# Patient Record
Sex: Male | Born: 1945 | Race: White | Hispanic: No | Marital: Single | Smoking: Never smoker
Health system: Southern US, Community
[De-identification: ages and names within clinical notes are randomized; demographics above are authoritative.]

## PROBLEM LIST (undated history)

## (undated) DIAGNOSIS — E079 Disorder of thyroid, unspecified: Secondary | ICD-10-CM

## (undated) DIAGNOSIS — I10 Essential (primary) hypertension: Secondary | ICD-10-CM

## (undated) DIAGNOSIS — E119 Type 2 diabetes mellitus without complications: Secondary | ICD-10-CM

## (undated) HISTORY — DX: Type 2 diabetes mellitus without complications: E11.9

## (undated) HISTORY — PX: COLONOSCOPY WITH PROPOFOL: SHX5780

## (undated) HISTORY — DX: Essential (primary) hypertension: I10

## (undated) HISTORY — DX: Disorder of thyroid, unspecified: E07.9

## (undated) HISTORY — PX: HERNIA REPAIR: SHX51

---

## 2012-06-19 DIAGNOSIS — Z23 Encounter for immunization: Secondary | ICD-10-CM | POA: Diagnosis not present

## 2013-02-21 DIAGNOSIS — R209 Unspecified disturbances of skin sensation: Secondary | ICD-10-CM | POA: Diagnosis not present

## 2013-02-21 DIAGNOSIS — N529 Male erectile dysfunction, unspecified: Secondary | ICD-10-CM | POA: Diagnosis not present

## 2013-02-21 DIAGNOSIS — N181 Chronic kidney disease, stage 1: Secondary | ICD-10-CM | POA: Diagnosis not present

## 2013-02-21 DIAGNOSIS — G5790 Unspecified mononeuropathy of unspecified lower limb: Secondary | ICD-10-CM | POA: Diagnosis not present

## 2013-02-21 DIAGNOSIS — I129 Hypertensive chronic kidney disease with stage 1 through stage 4 chronic kidney disease, or unspecified chronic kidney disease: Secondary | ICD-10-CM | POA: Diagnosis not present

## 2013-03-01 ENCOUNTER — Ambulatory Visit: Payer: Self-pay | Admitting: Internal Medicine

## 2013-03-01 DIAGNOSIS — G5790 Unspecified mononeuropathy of unspecified lower limb: Secondary | ICD-10-CM | POA: Diagnosis not present

## 2013-03-01 DIAGNOSIS — M5137 Other intervertebral disc degeneration, lumbosacral region: Secondary | ICD-10-CM | POA: Diagnosis not present

## 2013-03-01 DIAGNOSIS — N529 Male erectile dysfunction, unspecified: Secondary | ICD-10-CM | POA: Diagnosis not present

## 2013-03-01 DIAGNOSIS — IMO0002 Reserved for concepts with insufficient information to code with codable children: Secondary | ICD-10-CM | POA: Diagnosis not present

## 2013-03-01 DIAGNOSIS — M79609 Pain in unspecified limb: Secondary | ICD-10-CM | POA: Diagnosis not present

## 2013-03-15 DIAGNOSIS — R209 Unspecified disturbances of skin sensation: Secondary | ICD-10-CM | POA: Diagnosis not present

## 2013-03-15 DIAGNOSIS — I129 Hypertensive chronic kidney disease with stage 1 through stage 4 chronic kidney disease, or unspecified chronic kidney disease: Secondary | ICD-10-CM | POA: Diagnosis not present

## 2013-03-15 DIAGNOSIS — E785 Hyperlipidemia, unspecified: Secondary | ICD-10-CM | POA: Diagnosis not present

## 2013-03-15 DIAGNOSIS — N181 Chronic kidney disease, stage 1: Secondary | ICD-10-CM | POA: Diagnosis not present

## 2013-03-24 DIAGNOSIS — R209 Unspecified disturbances of skin sensation: Secondary | ICD-10-CM | POA: Diagnosis not present

## 2013-03-24 DIAGNOSIS — G5790 Unspecified mononeuropathy of unspecified lower limb: Secondary | ICD-10-CM | POA: Diagnosis not present

## 2013-03-24 DIAGNOSIS — E785 Hyperlipidemia, unspecified: Secondary | ICD-10-CM | POA: Diagnosis not present

## 2013-03-24 DIAGNOSIS — N529 Male erectile dysfunction, unspecified: Secondary | ICD-10-CM | POA: Diagnosis not present

## 2013-06-20 DIAGNOSIS — Z23 Encounter for immunization: Secondary | ICD-10-CM | POA: Diagnosis not present

## 2013-07-04 DIAGNOSIS — Z125 Encounter for screening for malignant neoplasm of prostate: Secondary | ICD-10-CM | POA: Diagnosis not present

## 2013-07-04 DIAGNOSIS — N529 Male erectile dysfunction, unspecified: Secondary | ICD-10-CM | POA: Diagnosis not present

## 2013-07-04 DIAGNOSIS — Z1331 Encounter for screening for depression: Secondary | ICD-10-CM | POA: Diagnosis not present

## 2013-07-04 DIAGNOSIS — E785 Hyperlipidemia, unspecified: Secondary | ICD-10-CM | POA: Diagnosis not present

## 2013-07-04 DIAGNOSIS — Z800 Family history of malignant neoplasm of digestive organs: Secondary | ICD-10-CM | POA: Diagnosis not present

## 2013-08-22 ENCOUNTER — Ambulatory Visit: Payer: Self-pay | Admitting: Gastroenterology

## 2013-08-22 DIAGNOSIS — Z1211 Encounter for screening for malignant neoplasm of colon: Secondary | ICD-10-CM | POA: Diagnosis not present

## 2013-08-22 DIAGNOSIS — K5730 Diverticulosis of large intestine without perforation or abscess without bleeding: Secondary | ICD-10-CM | POA: Diagnosis not present

## 2013-08-22 DIAGNOSIS — Z79899 Other long term (current) drug therapy: Secondary | ICD-10-CM | POA: Diagnosis not present

## 2013-08-22 DIAGNOSIS — D126 Benign neoplasm of colon, unspecified: Secondary | ICD-10-CM | POA: Diagnosis not present

## 2013-08-22 DIAGNOSIS — K648 Other hemorrhoids: Secondary | ICD-10-CM | POA: Diagnosis not present

## 2013-09-26 DIAGNOSIS — I739 Peripheral vascular disease, unspecified: Secondary | ICD-10-CM | POA: Diagnosis not present

## 2013-09-26 DIAGNOSIS — G5790 Unspecified mononeuropathy of unspecified lower limb: Secondary | ICD-10-CM | POA: Diagnosis not present

## 2013-09-26 DIAGNOSIS — R209 Unspecified disturbances of skin sensation: Secondary | ICD-10-CM | POA: Diagnosis not present

## 2013-09-26 DIAGNOSIS — N529 Male erectile dysfunction, unspecified: Secondary | ICD-10-CM | POA: Diagnosis not present

## 2013-10-12 DIAGNOSIS — G5790 Unspecified mononeuropathy of unspecified lower limb: Secondary | ICD-10-CM | POA: Diagnosis not present

## 2013-10-12 DIAGNOSIS — E785 Hyperlipidemia, unspecified: Secondary | ICD-10-CM | POA: Diagnosis not present

## 2013-10-12 DIAGNOSIS — R209 Unspecified disturbances of skin sensation: Secondary | ICD-10-CM | POA: Diagnosis not present

## 2013-10-12 DIAGNOSIS — Z79899 Other long term (current) drug therapy: Secondary | ICD-10-CM | POA: Diagnosis not present

## 2013-10-12 DIAGNOSIS — N529 Male erectile dysfunction, unspecified: Secondary | ICD-10-CM | POA: Diagnosis not present

## 2013-10-12 DIAGNOSIS — M79609 Pain in unspecified limb: Secondary | ICD-10-CM | POA: Diagnosis not present

## 2013-10-17 DIAGNOSIS — I739 Peripheral vascular disease, unspecified: Secondary | ICD-10-CM | POA: Diagnosis not present

## 2013-12-14 IMAGING — CR DG LUMBAR SPINE AP/LAT/OBLIQUES W/ FLEX AND EXT
1 series · 6 of 6 positions shown · non-contrast
Comparison: none

REASON FOR EXAM: PAIN IN LEFT LEG
COMMENTS:

PROCEDURE:     DXR - DXR LUMBAR SPINE WITH OBLIQUES  - March 01, 2013 [DATE]
RESULT:     Comparison: None.

[Series 1: t lumbar spine ap · 0.14mm/px · 6 of 6 slices shown]
[im 1/6]
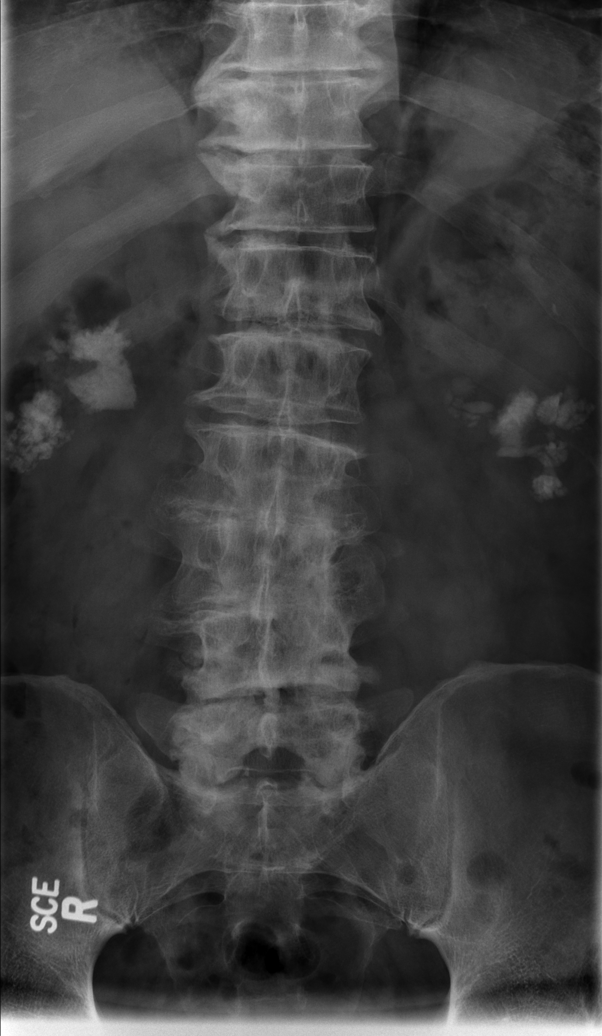
[im 2/6]
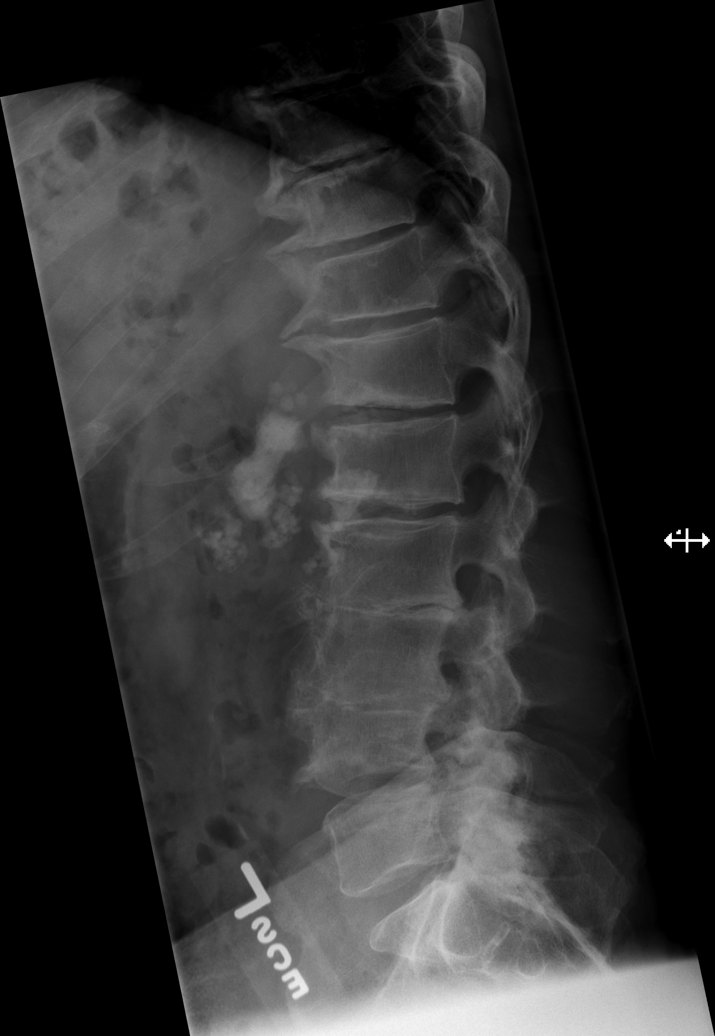
[im 3/6]
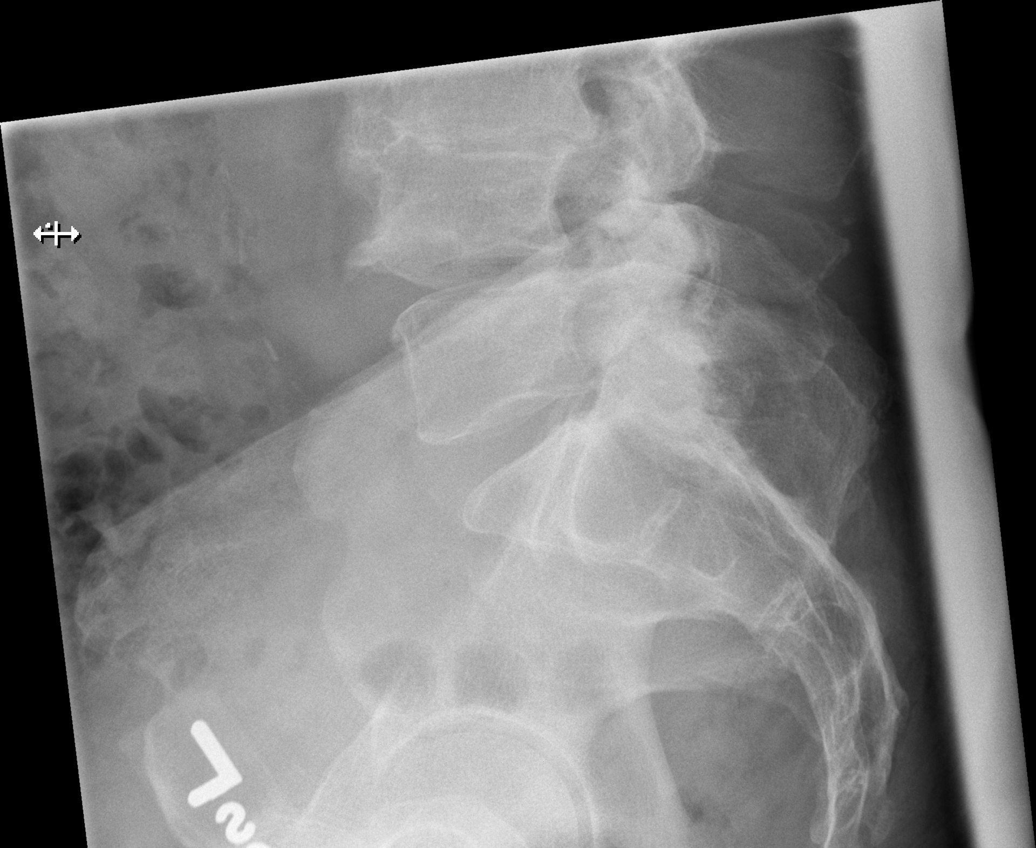
[im 4/6]
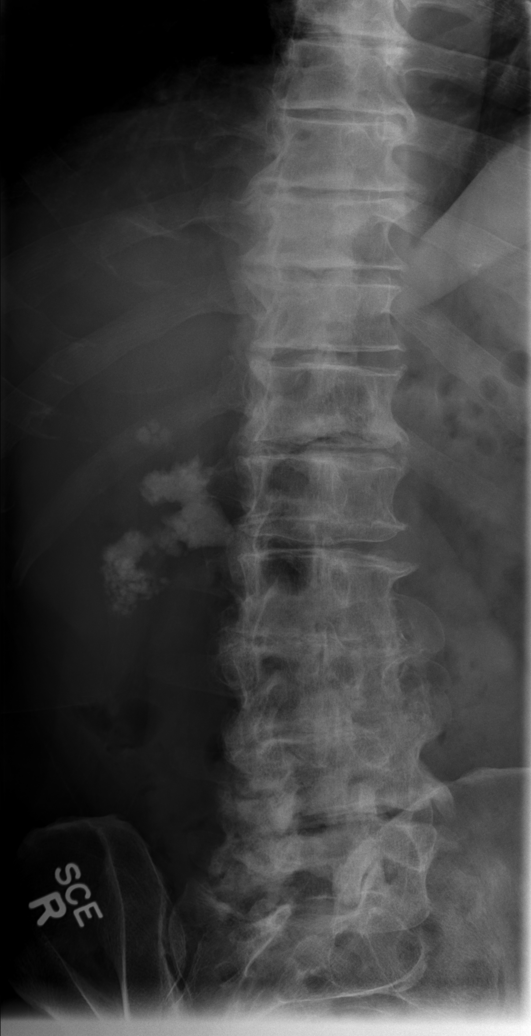
[im 5/6]
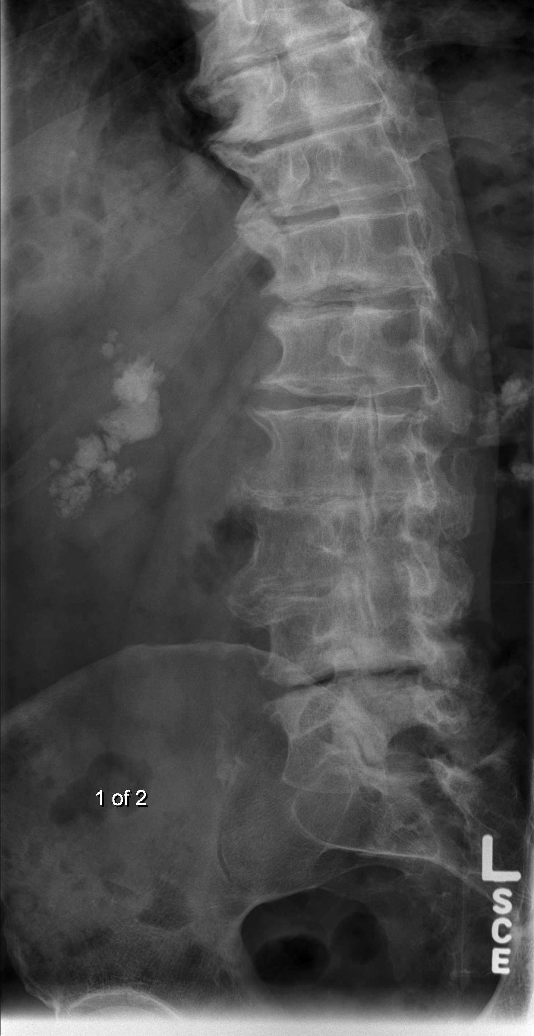
[im 6/6]
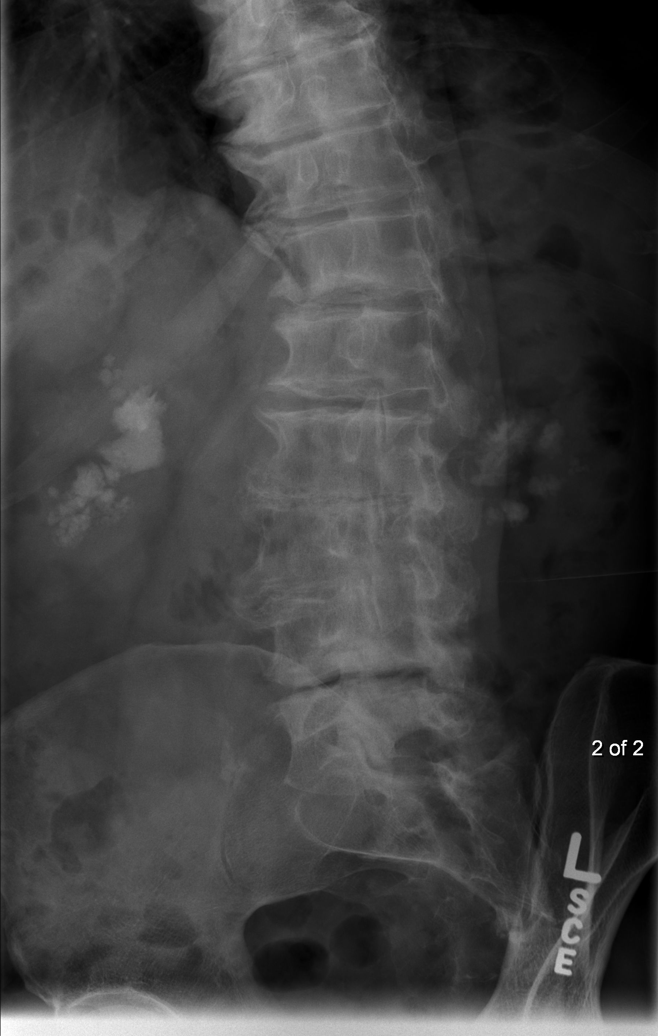

[6 of 6 positions shown; findings below may reference images not displayed]

FINDINGS: There are 5 lumbar type vertebra bodies. There is slight dextrocurvature of
the lumbar spine. Intervertebral disc loss and osteophytosis are seen at
L2-L3 and L3-L4. There is mild age-indeterminate height loss of the T12, L1,
and L3 vertebral bodies. Normal alignment. Flowing anterior ossification in
the lower thoracic spine may represent diffuse idiopathic skeletal
hyperostosis (DISH). No definite pars defect on the oblique views.

Large calculi are seen in bilateral kidneys.
IMPRESSION: 1. Multilevel degenerative disc disease.
2. Mild age-indeterminate height loss of the T12, L1, and L3 vertebral
bodies. Correlate with patient's history and site of pain.
3. Other findings as above.

[REDACTED]

## 2014-01-16 DIAGNOSIS — G5790 Unspecified mononeuropathy of unspecified lower limb: Secondary | ICD-10-CM | POA: Diagnosis not present

## 2014-01-16 DIAGNOSIS — E785 Hyperlipidemia, unspecified: Secondary | ICD-10-CM | POA: Diagnosis not present

## 2014-02-16 DIAGNOSIS — G5790 Unspecified mononeuropathy of unspecified lower limb: Secondary | ICD-10-CM | POA: Diagnosis not present

## 2014-02-16 DIAGNOSIS — E785 Hyperlipidemia, unspecified: Secondary | ICD-10-CM | POA: Diagnosis not present

## 2014-02-16 DIAGNOSIS — R209 Unspecified disturbances of skin sensation: Secondary | ICD-10-CM | POA: Diagnosis not present

## 2014-02-16 DIAGNOSIS — N529 Male erectile dysfunction, unspecified: Secondary | ICD-10-CM | POA: Diagnosis not present

## 2014-05-18 DIAGNOSIS — E785 Hyperlipidemia, unspecified: Secondary | ICD-10-CM | POA: Diagnosis not present

## 2014-05-18 DIAGNOSIS — G5790 Unspecified mononeuropathy of unspecified lower limb: Secondary | ICD-10-CM | POA: Diagnosis not present

## 2014-06-14 DIAGNOSIS — Z23 Encounter for immunization: Secondary | ICD-10-CM | POA: Diagnosis not present

## 2014-08-15 DIAGNOSIS — Z23 Encounter for immunization: Secondary | ICD-10-CM | POA: Diagnosis not present

## 2014-08-17 DIAGNOSIS — N529 Male erectile dysfunction, unspecified: Secondary | ICD-10-CM | POA: Diagnosis not present

## 2014-08-17 DIAGNOSIS — E785 Hyperlipidemia, unspecified: Secondary | ICD-10-CM | POA: Diagnosis not present

## 2014-08-17 DIAGNOSIS — R200 Anesthesia of skin: Secondary | ICD-10-CM | POA: Diagnosis not present

## 2014-08-30 DIAGNOSIS — H2513 Age-related nuclear cataract, bilateral: Secondary | ICD-10-CM | POA: Diagnosis not present

## 2014-11-16 DIAGNOSIS — Z0000 Encounter for general adult medical examination without abnormal findings: Secondary | ICD-10-CM | POA: Diagnosis not present

## 2014-11-16 DIAGNOSIS — E785 Hyperlipidemia, unspecified: Secondary | ICD-10-CM | POA: Diagnosis not present

## 2014-11-16 DIAGNOSIS — N529 Male erectile dysfunction, unspecified: Secondary | ICD-10-CM | POA: Diagnosis not present

## 2014-11-17 DIAGNOSIS — Z125 Encounter for screening for malignant neoplasm of prostate: Secondary | ICD-10-CM | POA: Diagnosis not present

## 2014-11-17 DIAGNOSIS — I10 Essential (primary) hypertension: Secondary | ICD-10-CM | POA: Diagnosis not present

## 2014-11-17 DIAGNOSIS — E784 Other hyperlipidemia: Secondary | ICD-10-CM | POA: Diagnosis not present

## 2014-11-30 DIAGNOSIS — E785 Hyperlipidemia, unspecified: Secondary | ICD-10-CM | POA: Diagnosis not present

## 2014-11-30 DIAGNOSIS — E786 Lipoprotein deficiency: Secondary | ICD-10-CM | POA: Diagnosis not present

## 2014-11-30 DIAGNOSIS — R200 Anesthesia of skin: Secondary | ICD-10-CM | POA: Diagnosis not present

## 2015-03-02 DIAGNOSIS — E785 Hyperlipidemia, unspecified: Secondary | ICD-10-CM | POA: Diagnosis not present

## 2015-03-02 DIAGNOSIS — I10 Essential (primary) hypertension: Secondary | ICD-10-CM | POA: Diagnosis not present

## 2015-03-02 DIAGNOSIS — N529 Male erectile dysfunction, unspecified: Secondary | ICD-10-CM | POA: Diagnosis not present

## 2015-06-01 DIAGNOSIS — I10 Essential (primary) hypertension: Secondary | ICD-10-CM | POA: Diagnosis not present

## 2015-06-04 DIAGNOSIS — Z23 Encounter for immunization: Secondary | ICD-10-CM | POA: Diagnosis not present

## 2015-08-31 DIAGNOSIS — R200 Anesthesia of skin: Secondary | ICD-10-CM | POA: Diagnosis not present

## 2015-08-31 DIAGNOSIS — I10 Essential (primary) hypertension: Secondary | ICD-10-CM | POA: Diagnosis not present

## 2015-08-31 DIAGNOSIS — N529 Male erectile dysfunction, unspecified: Secondary | ICD-10-CM | POA: Diagnosis not present

## 2015-12-04 DIAGNOSIS — N529 Male erectile dysfunction, unspecified: Secondary | ICD-10-CM | POA: Diagnosis not present

## 2015-12-04 DIAGNOSIS — E785 Hyperlipidemia, unspecified: Secondary | ICD-10-CM | POA: Diagnosis not present

## 2016-03-04 DIAGNOSIS — N529 Male erectile dysfunction, unspecified: Secondary | ICD-10-CM | POA: Diagnosis not present

## 2016-03-04 DIAGNOSIS — R200 Anesthesia of skin: Secondary | ICD-10-CM | POA: Diagnosis not present

## 2016-03-04 DIAGNOSIS — E785 Hyperlipidemia, unspecified: Secondary | ICD-10-CM | POA: Diagnosis not present

## 2016-04-03 DIAGNOSIS — E784 Other hyperlipidemia: Secondary | ICD-10-CM | POA: Diagnosis not present

## 2016-04-03 DIAGNOSIS — Z125 Encounter for screening for malignant neoplasm of prostate: Secondary | ICD-10-CM | POA: Diagnosis not present

## 2016-04-03 DIAGNOSIS — I10 Essential (primary) hypertension: Secondary | ICD-10-CM | POA: Diagnosis not present

## 2016-04-10 DIAGNOSIS — Z0000 Encounter for general adult medical examination without abnormal findings: Secondary | ICD-10-CM | POA: Diagnosis not present

## 2016-06-18 DIAGNOSIS — Z23 Encounter for immunization: Secondary | ICD-10-CM | POA: Diagnosis not present

## 2016-10-09 DIAGNOSIS — N529 Male erectile dysfunction, unspecified: Secondary | ICD-10-CM | POA: Diagnosis not present

## 2016-10-09 DIAGNOSIS — E785 Hyperlipidemia, unspecified: Secondary | ICD-10-CM | POA: Diagnosis not present

## 2016-10-09 DIAGNOSIS — R200 Anesthesia of skin: Secondary | ICD-10-CM | POA: Diagnosis not present

## 2016-11-11 DIAGNOSIS — R200 Anesthesia of skin: Secondary | ICD-10-CM | POA: Diagnosis not present

## 2016-11-11 DIAGNOSIS — E785 Hyperlipidemia, unspecified: Secondary | ICD-10-CM | POA: Diagnosis not present

## 2016-11-11 DIAGNOSIS — N529 Male erectile dysfunction, unspecified: Secondary | ICD-10-CM | POA: Diagnosis not present

## 2017-02-02 DIAGNOSIS — Z1389 Encounter for screening for other disorder: Secondary | ICD-10-CM | POA: Diagnosis not present

## 2017-02-02 DIAGNOSIS — N529 Male erectile dysfunction, unspecified: Secondary | ICD-10-CM | POA: Diagnosis not present

## 2017-02-02 DIAGNOSIS — E785 Hyperlipidemia, unspecified: Secondary | ICD-10-CM | POA: Diagnosis not present

## 2017-02-02 DIAGNOSIS — R200 Anesthesia of skin: Secondary | ICD-10-CM | POA: Diagnosis not present

## 2017-04-21 DIAGNOSIS — R200 Anesthesia of skin: Secondary | ICD-10-CM | POA: Diagnosis not present

## 2017-04-21 DIAGNOSIS — E785 Hyperlipidemia, unspecified: Secondary | ICD-10-CM | POA: Diagnosis not present

## 2017-04-21 DIAGNOSIS — N529 Male erectile dysfunction, unspecified: Secondary | ICD-10-CM | POA: Diagnosis not present

## 2017-06-19 DIAGNOSIS — Z23 Encounter for immunization: Secondary | ICD-10-CM | POA: Diagnosis not present

## 2017-07-22 DIAGNOSIS — R200 Anesthesia of skin: Secondary | ICD-10-CM | POA: Diagnosis not present

## 2017-07-22 DIAGNOSIS — N529 Male erectile dysfunction, unspecified: Secondary | ICD-10-CM | POA: Diagnosis not present

## 2017-07-22 DIAGNOSIS — E785 Hyperlipidemia, unspecified: Secondary | ICD-10-CM | POA: Diagnosis not present

## 2017-10-21 DIAGNOSIS — R200 Anesthesia of skin: Secondary | ICD-10-CM | POA: Diagnosis not present

## 2017-10-21 DIAGNOSIS — N529 Male erectile dysfunction, unspecified: Secondary | ICD-10-CM | POA: Diagnosis not present

## 2017-10-21 DIAGNOSIS — E785 Hyperlipidemia, unspecified: Secondary | ICD-10-CM | POA: Diagnosis not present

## 2018-01-20 DIAGNOSIS — R200 Anesthesia of skin: Secondary | ICD-10-CM | POA: Diagnosis not present

## 2018-01-20 DIAGNOSIS — E785 Hyperlipidemia, unspecified: Secondary | ICD-10-CM | POA: Diagnosis not present

## 2018-01-20 DIAGNOSIS — N529 Male erectile dysfunction, unspecified: Secondary | ICD-10-CM | POA: Diagnosis not present

## 2018-01-26 DIAGNOSIS — E7849 Other hyperlipidemia: Secondary | ICD-10-CM | POA: Diagnosis not present

## 2018-01-26 DIAGNOSIS — I10 Essential (primary) hypertension: Secondary | ICD-10-CM | POA: Diagnosis not present

## 2018-01-26 DIAGNOSIS — Z125 Encounter for screening for malignant neoplasm of prostate: Secondary | ICD-10-CM | POA: Diagnosis not present

## 2018-02-09 DIAGNOSIS — Z0000 Encounter for general adult medical examination without abnormal findings: Secondary | ICD-10-CM | POA: Diagnosis not present

## 2018-02-09 DIAGNOSIS — R200 Anesthesia of skin: Secondary | ICD-10-CM | POA: Diagnosis not present

## 2018-02-09 DIAGNOSIS — E785 Hyperlipidemia, unspecified: Secondary | ICD-10-CM | POA: Diagnosis not present

## 2018-02-09 DIAGNOSIS — E039 Hypothyroidism, unspecified: Secondary | ICD-10-CM | POA: Diagnosis not present

## 2018-03-29 DIAGNOSIS — H6123 Impacted cerumen, bilateral: Secondary | ICD-10-CM | POA: Diagnosis not present

## 2018-03-29 DIAGNOSIS — H9190 Unspecified hearing loss, unspecified ear: Secondary | ICD-10-CM | POA: Diagnosis not present

## 2018-03-30 DIAGNOSIS — H9190 Unspecified hearing loss, unspecified ear: Secondary | ICD-10-CM | POA: Diagnosis not present

## 2018-06-23 DIAGNOSIS — Z23 Encounter for immunization: Secondary | ICD-10-CM | POA: Diagnosis not present

## 2018-06-29 ENCOUNTER — Encounter: Payer: Self-pay | Admitting: Podiatry

## 2018-06-29 ENCOUNTER — Other Ambulatory Visit: Payer: Self-pay | Admitting: Podiatry

## 2018-06-29 ENCOUNTER — Ambulatory Visit (INDEPENDENT_AMBULATORY_CARE_PROVIDER_SITE_OTHER): Payer: Medicare Other | Admitting: Podiatry

## 2018-06-29 ENCOUNTER — Ambulatory Visit (INDEPENDENT_AMBULATORY_CARE_PROVIDER_SITE_OTHER): Payer: Medicare Other

## 2018-06-29 VITALS — BP 169/85 | HR 74

## 2018-06-29 DIAGNOSIS — M7662 Achilles tendinitis, left leg: Secondary | ICD-10-CM | POA: Diagnosis not present

## 2018-06-29 DIAGNOSIS — M25673 Stiffness of unspecified ankle, not elsewhere classified: Secondary | ICD-10-CM | POA: Diagnosis not present

## 2018-06-29 DIAGNOSIS — M25571 Pain in right ankle and joints of right foot: Secondary | ICD-10-CM

## 2018-06-29 DIAGNOSIS — M7661 Achilles tendinitis, right leg: Secondary | ICD-10-CM | POA: Diagnosis not present

## 2018-06-29 DIAGNOSIS — M25572 Pain in left ankle and joints of left foot: Principal | ICD-10-CM

## 2018-06-30 DIAGNOSIS — H9190 Unspecified hearing loss, unspecified ear: Secondary | ICD-10-CM | POA: Diagnosis not present

## 2018-06-30 DIAGNOSIS — H6123 Impacted cerumen, bilateral: Secondary | ICD-10-CM | POA: Diagnosis not present

## 2018-07-02 NOTE — Progress Notes (Signed)
   HPI: 72 year old male presenting today as a new patient with a chief complaint of difficulty balancing or flex his feet. He reports associated stiffness of the ankles. He reports the symptoms are intermittent and began 4-5 years ago. He has not done anything for treatment. Walking increases the pain. Patient is here for further evaluation and treatment.   History reviewed. No pertinent past medical history.   Physical Exam: General: The patient is alert and oriented x3 in no acute distress.  Dermatology: Skin is warm, dry and supple bilateral lower extremities. Negative for open lesions or macerations.  Vascular: Palpable pedal pulses bilaterally. No edema or erythema noted. Capillary refill within normal limits.  Neurological: Epicritic and protective threshold grossly intact bilaterally.   Musculoskeletal Exam: Range of motion within normal limits to all pedal and ankle joints bilateral. Muscle strength 5/5 in all groups bilateral.   Radiographic Exam:  Normal osseous mineralization. Joint spaces preserved. No fracture/dislocation/boney destruction.    Assessment: 1. Generalized foot and ankle stiffness bilateral    Plan of Care:  1. Patient evaluated. X-Rays reviewed.  2. Orders for physical therapy three times per week for four weeks placed today.  3. Return to clinic in 6 weeks.      Edrick Kins, DPM [facility]  Dr. Edrick Kins, DPM    [address], [place] [address]                Office [phone]  Fax [phone]

## 2018-07-21 ENCOUNTER — Ambulatory Visit: Payer: Medicare Other | Admitting: Physical Therapy

## 2018-07-27 ENCOUNTER — Other Ambulatory Visit: Payer: Self-pay

## 2018-07-27 ENCOUNTER — Ambulatory Visit: Payer: Medicare Other | Attending: Podiatry | Admitting: Physical Therapy

## 2018-07-27 ENCOUNTER — Encounter: Payer: Self-pay | Admitting: Physical Therapy

## 2018-07-27 DIAGNOSIS — R262 Difficulty in walking, not elsewhere classified: Secondary | ICD-10-CM | POA: Diagnosis not present

## 2018-07-27 DIAGNOSIS — R202 Paresthesia of skin: Secondary | ICD-10-CM | POA: Insufficient documentation

## 2018-07-27 DIAGNOSIS — M25571 Pain in right ankle and joints of right foot: Secondary | ICD-10-CM | POA: Insufficient documentation

## 2018-07-27 DIAGNOSIS — M25572 Pain in left ankle and joints of left foot: Secondary | ICD-10-CM | POA: Diagnosis not present

## 2018-07-27 NOTE — Therapy (Signed)
[place] [facility] [address] [place], [address] Phone: [phone]   Fax:  [phone]  Physical Therapy Evaluation  Patient Details  Name: Adam Castillo MRN: [id number] Date of Birth: [birth year] Referring Provider (PT): Edrick Kins, [place]   Encounter Date: 07/27/2018  PT End of Session - 07/27/18 1611    Visit Number  1    Number of Visits  12    Date for PT Re-Evaluation  09/07/18    Authorization Type  Medicare    Authorization Time Period  Current Cert period: 29/93/7169 - 09/07/2018    Authorization - Visit Number  1    Authorization - Number of Visits  10    PT Start Time  6789    PT Stop Time  1540    PT Time Calculation (min)  65 min    Activity Tolerance  Patient tolerated treatment well    Behavior During Therapy  [facility] for tasks assessed/performed       History reviewed. No pertinent past medical history.  History reviewed. No pertinent surgical history.  There were no vitals filed for this visit.  SUBJECTIVE: HISTORY:  Patient is a 72 y.o. male who presents to outpatient physical therapy with a referral for generalized foot and ankle stiffness. This patient's chief complaints consist of a "stiff" feeling and numbness and tingling in bilateral feet,, leading to the following functional deficits: difficulty with prolonged weight bearing and walking tasks. Relevant past medical history and comorbidities include hypothyroidism, hypertension. 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 Pt states he had bloodwork done in the last 6 months and was informed it was very good Imaging:  in office radiographs bilateral ankles 06/29/18 reported by provider "Normal osseous mineralization. Joint spaces preserved. No fracture/dislocation/boney destruction." Response to previously administered skilled services: none  Patient states condition started maybe 3-4 years ago with gradual onset and no apparent  injury. Pt reports his condition seems to stay about the same. His feet get sore at night after working a long time and he wears boots at work. Both feet feel about the same, neither is worse than the other. Feet feel achy at night after working.  Denies falls in last 6 months.  Not as active but not just because of feet.  Does not make him feel like not leaving house.   SPINAL HISTORY: a few years ago it was said his back looked normal for his age.   FUNCTION Patient-reported Outcome Measure: Pt did not fully complete.  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 Involves a lot of standing, walking, lifting, ladder climbing sometimes. Semi-retired (doesn't work every day, could work all week or not at all. 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 Wears boots (no steel toes, no extra padding) Hobbies: Dancing, country western [place] (no difficulty dancing, regular shoes).  PLOF: no limitations in weight bearing or walking, no difficulty sleeping due to current condition.  Current Level of Function: cannot run, pain/numbness after walking a good ways. Difficulty walking around store.   PAIN Location: bilateral feet on the bottom. Nature: numb, tingling, achy; like a toothache. Pain Scale:  Patient reports current pain as 0/10, at best 0/10, at worst 3/10, and during functional activity 3/10. Paresthesia: denies except feet.  Aggravating factors: prolonged walking/weight bearing at work Easing factors: good hot shower, sitting for 30 seconds (numbness leaves),  different shoes don't make a big difference.  24-hour pattern: at night time worse after working.  SPECIAL SCREENING QUESTIONS Dizziness, double vision, difficulty swallowing, difficulty speaking, fainting spells or blackouts, facial numbness, or nausea: denies. Recent illness or fever: denies. Recent unexplained weight loss: denies. Night  pain/sweats: denies, pain wakes him up but he can go back to sleep. Recent bowel or bladder function abnormality: denies Current symptoms/concerns not elsewhere described: denies.  Subjective Assessment - 07/27/18 1435    Subjective  Patient states condition started maybe 3-4 years ago with gradual onset and no apparent injury. Pt reports his condition seems to stay about the same. His feet get sore at night after working a long time and he wears boots at work. Both feet feel about the same, neither is worse than the other. Feet feel achy at night after working.     Pertinent History  Patient is a 72 y.o. male who presents to outpatient physical therapy with a referral for generalized foot and ankle stiffness. This patient's chief complaints consist of a "stiff" feeling and numbness and tingling in bilateral feet,, leading to the following functional deficits: difficulty with prolonged weight bearing and walking tasks. Relevant past medical history and comorbidities include hypothyroidism, hypertension. 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 Pt states he had bloodwork done in the last 6 months and was informed it was very good Imaging:  in office radiographs bilateral ankles 06/29/18 reported by provider "Normal osseous mineralization. Joint spaces preserved. No fracture/dislocation/boney destruction."    Limitations  Walking    How long can you sit comfortably?  unlimited    How long can you walk comfortably?  1/8 mile, then good to go afte 30 second break    Currently in Pain?  Other (Comment)   no pain currently. Recorded description when occurs   Pain Location  Ankle    Pain Orientation  Left;Right    Pain Descriptors / Indicators  Aching;Numbness;Tingling;Other (Comment)   like a toothache   Pain Type  Chronic pain    Pain Onset  More than a month ago    Pain Frequency  Intermittent    Aggravating Factors   walking, prolonged weight bearing, working (walking/weight bearing).      Pain Relieving Factors  hot shower, sitting    Effect of Pain on Daily Activities  limits how far he can walk before needing to sit        [facility] PT Assessment - 07/28/18 0001      Assessment   Medical Diagnosis  stiffness of ankle joint (generalized foot and ankle stiffness, unspecified laterality    Referring Provider (PT)  Edrick Kins, DPM    Next MD Visit  November 26th, 2019?    Prior Therapy  Denies      Precautions   Precautions  None      Restrictions   Weight Bearing Restrictions  Yes      Balance Screen   Has the patient fallen in the past 6 months  No    Has the patient had a decrease in activity level because of a fear of falling?   Yes    Is the patient reluctant to leave their home because of a fear of falling?   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 Involves a lot of standing, walking, lifting, ladder climbing sometimes. Semi-retired (doesn't work every day, could work all week or not at all. 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 Wears boots (no steel toes, no extra padding)    Leisure  : Dancing, country western behind city park (no difficulty dancing, regular shoes)      Cognition   Overall Cognitive Status  Within Functional Limits for tasks assessed      Observation/Other Assessments   Observations  see note from 07/27/18 for most up to date Objective status         OBJECTIVE: OBSERVATION/INSPECTION: Patient presents with hair on all toes toes. Hair loss noted on lower legs bilaterally.    NEUROLOGICAL: Dermatomes: BLE equal bilaterally to light touch, notes some tingly sensation along toes. Does not appear in a dermatomal pattern.  Myotomes: BLE WNL. Reflexes: - Quadriceps reflex (L4): B = 2+ - Achilles reflex (S1): B = absent Upper Motor Neuron Screen: Babinski, Hoffman's, and Clonus (ankle) negative bilaterally.  SPINE MOTION Lumbar AROM:  *Indicates pain - Flexion: = 100% (fingers to toes) - Extension: = 0%  PERIPHERAL JOINT MOTION (AROM/PROM in degrees):  *Indicates pain BLE grossly WFL except lacking some hip extension bilaterally.  Ankle and great toe WNL including:  - Dorsiflexion knee flexed 90 degrees: R = 12/22, L = 10/20.  STRENGTH:  *Indicates pain BLE Grossly WNL Ankle (seated position)  - Dorsiflexion: R = 5/5, L = 5/5. - Plantarflexion: R = 5/5, L = 5/5. - Eversion: R = 5/5, L = 5/5. - Great Toe Extension: R = 4/5, L = 4/5. - Inversion: R = 5/5, L = 5/5.  REPEATED MOTIONS TESTING: - Lumbar extension in standing x 10: during = no effect ; after = no effect - Repeated lumbar extension in prone x 10 during =  Possibly worsening numbness; after = no worse.  - Prone on elbows x 5 min = increased numbness (but pt unsure).  - Supine hooklying position x 2 min = no change - Supine repeated lumbar flexion in supine x 10 during = decreased; after = no better (continued to tingle, felt better without foot pressure on plinth).   SPECIAL TESTS: - Straight leg raise (SLR): B = negative - Slump: B = negative ACCESSORY MOTION:  - WNL  PALPATION: - Tingle/numbness to palpation, stocking pattern - Toes cold to touch bilateral sides - Pedal pulses easily palpated bilateral sides (dorsalis pedis and posterior tibials arteries).   FUNCTIONAL MOBILITY: - WNL except limited in standing.   FUNCTIONAL/BALANCE TESTS: Romberg test: -Narrow stance, eyes open: >30 seconds -Narrow stance, eyes closed: >30 seconds Sharpened Romberg test: -Tandem stance,  eyes open: >30  Seconds (both feet forward) -Tandem stance, eyes closed: <4 seconds (both feet forward) Single leg stance, firm surface, eyes open: R= <4 seconds, L= <4 seconds Six Minute Walk Test: 1300 feet reports symptoms are possibly better or unchanged.   EDUCATION/COGNITION: Patient is alert and oriented X 4.  Objective measurements completed on examination: See above findings.   TREATMENT:  -Education on diagnosis, prognosis, POC, anatomy and  physiology of current condition.      PT Education - 07/27/18 1606    Education Details  Education on diagnosis, prognosis, POC, anatomy and physiology of current condition.     Person(s) Educated  Patient    Methods  Explanation;Demonstration;Tactile cues    Comprehension  Verbalized understanding;Returned demonstration;Tactile cues required       PT Short Term Goals - 07/27/18 1818      PT SHORT TERM GOAL #1   Title  Be independent with home exercise program completed at least 3 times per week for self-management of symptoms.    Baseline  Plan to give initial HEP 2nd visit (07/27/2018).    Time  2    Period  Weeks    Status  New    Target Date  08/10/18        PT Long Term Goals - 07/28/18 0928      PT LONG TERM GOAL #1   Title  Be independent with a long-term home exercise program for self-management of symptoms.     Baseline  Initial HEP to be provided at visit 2. (07/27/2018);     Time  6    Period  Weeks    Status  New    Target Date  09/07/18      PT LONG TERM GOAL #2   Title  Complete community, work and/or recreational activities without limitation due to current condition.     Baseline  Limited in walking and sleeping (07/27/2018);    Time  6    Period  Weeks    Status  New    Target Date  09/07/18      PT LONG TERM GOAL #3   Title  Patient will improve single leg stance on left and right to equal or greater than 10 seconds to demonstrate improved static balance to reduce fall risk.     Baseline  Single  leg stance, firm surface, eyes open: R= <4 seconds, L= <4 seconds (07/27/2018);     Time  6    Period  Weeks    Status  New    Target Date  09/07/18      PT LONG TERM GOAL #4   Title  Improve lumbar extension AROM to 50% of normal with no increase in pain in all planes except intermittent end range discomfort to allow patient to complete valued activities with less difficulty and prevent loss of ambulatory function.     Baseline  0% lumbar extension (07/27/2018);    Time  6    Period  Weeks    Status  New    Target Date  09/07/18      PT LONG TERM GOAL #5   Title  Reduce pain with functional activities to equal or less than 1/10 to allow patient to complete usual activities including ADLs, IADLs, and social engagement with less difficulty.     Baseline  during functional activity 3/10 (07/27/2018);    Time  6    Period  Weeks    Status  New    Target Date  09/07/18             Plan - 07/27/18 1613    Clinical Impression Statement  Patient is a 72 y.o. male referred to outpatient physical therapy with a diagnosis of  who presents with signs and symptoms consistent with peripheral neuropathy vs spinal stenosis resulting in difficulty with prolonged weightbearing ambulation, and sleeping.    History and Personal Factors  relevant to plan of care:  hypothyroidism, hypertension    Clinical Presentation  Stable    Clinical Presentation due to:  pt's condition is chronic and unchanging    Clinical Decision Making  Low    Rehab Potential  Fair    Clinical Impairments Affecting Rehab Potential  (+) generally healthy, motivated, leads active lifestyle; (-) chronic nature of sympotms, peripheral neuropathy altered sensations largely unresponsive to PT, pt already with good activity level.     PT Frequency  2x / week    PT Duration  6 weeks    PT Treatment/Interventions  ADLs/Self Care Home Management;Biofeedback;Moist Heat;Cryotherapy;Gait training;Stair training;Functional mobility  training;Therapeutic activities;Therapeutic exercise;Balance training;Neuromuscular re-education;Patient/family education;Manual techniques;Passive range of motion;Spinal Manipulations;Joint Manipulations;Other (comment)   joint mobilizations grades I-V   PT Next Visit Plan  Give initial HEP, progress functional strengthening/conditioning as tolerated.     PT Home Exercise Plan  Not yet provided.     Consulted and Agree with Plan of Care  Patient       Patient will benefit from skilled therapeutic intervention in order to improve the following deficits and impairments:  Decreased balance, Decreased endurance, Decreased mobility, Difficulty walking, Impaired sensation, Postural dysfunction, Impaired perceived functional ability, Decreased range of motion, Decreased activity tolerance, Decreased strength, Pain  Visit Diagnosis: Paresthesia of skin - Plan: PT plan of care cert/re-cert  Difficulty in walking, not elsewhere classified - Plan: PT plan of care cert/re-cert  Pain in right ankle and joints of right foot - Plan: PT plan of care cert/re-cert  Pain in left ankle and joints of left foot - Plan: PT plan of care cert/re-cert     Problem List There are no active problems to display for this patient.   Nancy Nordmann, PT, DPT 07/28/2018, 9:54 AM  [place] [facility] [address] [place], [address] Phone: [phone]   Fax:  [phone]  Name: Adam Castillo MRN: [id number] Date of Birth: November 04, 1945

## 2018-08-02 ENCOUNTER — Encounter: Payer: Self-pay | Admitting: Physical Therapy

## 2018-08-02 ENCOUNTER — Ambulatory Visit: Payer: Medicare Other | Admitting: Physical Therapy

## 2018-08-02 ENCOUNTER — Other Ambulatory Visit: Payer: Self-pay

## 2018-08-02 DIAGNOSIS — M25572 Pain in left ankle and joints of left foot: Secondary | ICD-10-CM

## 2018-08-02 DIAGNOSIS — R262 Difficulty in walking, not elsewhere classified: Secondary | ICD-10-CM

## 2018-08-02 DIAGNOSIS — R202 Paresthesia of skin: Secondary | ICD-10-CM

## 2018-08-02 DIAGNOSIS — M25571 Pain in right ankle and joints of right foot: Secondary | ICD-10-CM

## 2018-08-02 NOTE — Therapy (Signed)
[facility] [address] [place], [address] Phone: [phone]   Fax:  [phone]  Physical Therapy Treatment  Patient Details  Name: Adam Castillo MRN: [id number] Date of Birth: 1945/11/10 Referring Provider (PT): Edrick Kins, [place]   Encounter Date: 08/02/2018  PT End of Session - 08/02/18 1043    Visit Number  2    Number of Visits  12    Date for PT Re-Evaluation  09/07/18    Authorization Type  Medicare    Authorization Time Period  Current Cert period: 17/51/0258 - 09/07/2018    Authorization - Visit Number  2    Authorization - Number of Visits  10    PT Start Time  1034    PT Stop Time  1115    PT Time Calculation (min)  41 min    Activity Tolerance  Patient tolerated treatment well    Behavior During Therapy  [facility] for tasks assessed/performed       History reviewed. No pertinent past medical history.  History reviewed. No pertinent surgical history.  There were no vitals filed for this visit.  Subjective Assessment - 08/02/18 1040    Subjective  Patient reports no significant change in his condition since last visit. Reports mild tingling in bilateral feet and a stiff feeling. Reports no increased difficulty sleeping or any negative effects after last physical therapy session. Pt to be given initial HEP today.     Pertinent History  Patient is a 72 y.o. male who presents to outpatient physical therapy with a referral for generalized foot and ankle stiffness. This patient's chief complaints consist of a "stiff" feeling and numbness and tingling in bilateral feet,, leading to the following functional deficits: difficulty with prolonged weight bearing and walking tasks. Relevant past medical history and comorbidities include hypothyroidism, hypertension. PCP is Scientist, physiological from [facility]. Pt states he had bloodwork done in the last 6 months and was informed it was very good Imaging:  in  office radiographs bilateral ankles 06/29/18 reported by provider "Normal osseous mineralization. Joint spaces preserved. No fracture/dislocation/boney destruction."    Limitations  Walking    How long can you sit comfortably?  unlimited    How long can you walk comfortably?  1/8 mile, then good to go afte 30 second break    Currently in Pain?  Other (Comment)   Pt reports mild tingling/stiffness in bilateral feet   Pain Location  Ankle    Pain Orientation  Right;Left    Pain Descriptors / Indicators  Numbness;Tingling    Pain Type  Chronic pain    Pain Onset  More than a month ago    Pain Frequency  Intermittent        PT Education - 08/02/18 1042    Education Details  HEP, purpose/form for activities    Methods  Explanation;Demonstration;Handout    Comprehension  Verbalized understanding      TREATMENT:   Therapeutic activities: for functional strengthening and improved functional activity tolerance. - Treadmill 1.5 mph at 0% grade with BUE support and cuing for improved posture. For improved lower extremity mobility, muscular endurance, and weightbearing activity tolerance; and to induce the analgesic effect of aerobic exercise, stimulate improved joint nutrition, and prepare body structures and systems for following interventions. x 5  Minutes during subjective exam.  - ball toss balance x 10 each on each side with 2kg ball, gait belt, and CGA-min A to prevent  falls. Tandem stance firm surface, tandem stance compliant surface, modified SLS with contralateral foot resting on ball on floor firm surface, SLS firm surface. To improve balance and standing tolerance.  - tandem walking forward and backwards 5 feet on aeromat (compliant surface) watching feet in mirror to prevent looking down, CGA with gait belt for safety. Forward and backward 5 feet x 5 times to improve dynamic balance.  - SLS with touch down unilateral UE support while contralateral ankle completes alphabet, to improve  balance during dual tasking. 1x each side.  - mini-lunge alternating onto bosu with dome up, 2x10 each side with CGA for safety. Cuing for form and technique.  - step up to bosu (dome side up) 2x10 each side with CGA support for safety. To improve dynamic balance and strength.  - Squats with buttocks tap to 18 inch chair with no UE support x 20. Cuing for form.   Therapeutic exercise: to centralize symptoms and improve ROM and strength required for successful completion of functional activities.  - standing heel raises with heels off edge of step in order to address full range for strengthening and improved ROM. BUE support, x 20.  - prone press up x 10 with heavy cuing to relax back and hips. Pt with great difficulty allowing back to sag even with strong cuing. To improve lumbar extension ROM to prevent stooped posture in standing.  - repeated lumbar extension in standing against counter x 10. Pt able to reach end range stretch. To improve posture in standing.  - attempted wall sag lumbar extension but pt with difficulty performing correctly even with max cuing to render exercise inefective at this point.  - standing toe raises with heels elevated on airex pad x 20 to stretch and strengthen ankles. U UE support.  -Education on HEP including handout   HOME EXERCISE PROGRAM Access Code: [email or web address]  URL: [email or web address]  Date: 08/02/2018  Prepared by: Rosita Kea   Exercises  Standing Lumbar Extension with Counter - 10-15 reps - 1 sets - 1 second hold - 4x daily  Squat with Chair Touch - 10-15 reps - 1 second hold - 3 Sets - 1x daily - 7x weekly  Single Leg Balance Alphabet - 2 Sets - 1x daily - 7x weekly  Heel Raise - 10-15 reps - 1 second hold - 3 Sets - 1x daily - 7x weekly   Patient response to treatment:  Pt tolerated treatment well. Pt was able to complete all exercises with minimal to no lasting increase in pain or discomfort. He reported mild decrease in tingling  when asked throughout visit and never complained of pain or increased tingling at any time. Reported fatigue after squats and repeated remarked he felt his balance is poor. Pt demo good tolerance to exercise and while his balance is diminished he demonstrates good compensation strategies and motivation work on balance. He does not appear to be high risk for falls at this point.  Pt required cuing for proper technique and to facilitate improved neuromuscular control, strength, range of motion, and functional ability. Pattern of symptoms does not appear to support spinal stenosis at this point. Pt reports legs feel heavy when he has worked for a while. Consider testing for symptoms of vascular claudication at next visit.     PT Short Term Goals - 07/27/18 1818      PT SHORT TERM GOAL #1   Title  Be independent with home exercise program completed at least 3 times per week for  self-management of symptoms.    Baseline  Plan to give initial HEP 2nd visit (07/27/2018).    Time  2    Period  Weeks    Status  New    Target Date  08/10/18        PT Long Term Goals - 07/28/18 0928      PT LONG TERM GOAL #1   Title  Be independent with a long-term home exercise program for self-management of symptoms.     Baseline  Initial HEP to be provided at visit 2. (07/27/2018);     Time  6    Period  Weeks    Status  New    Target Date  09/07/18      PT LONG TERM GOAL #2   Title  Complete community, work and/or recreational activities without limitation due to current condition.     Baseline  Limited in walking and sleeping (07/27/2018);    Time  6    Period  Weeks    Status  New    Target Date  09/07/18      PT LONG TERM GOAL #3   Title  Patient will improve single leg stance on left and right to equal or greater than 10 seconds to demonstrate improved static balance to reduce fall risk.     Baseline  Single leg stance, firm surface, eyes open: R= <4 seconds, L= <4 seconds (07/27/2018);     Time  6     Period  Weeks    Status  New    Target Date  09/07/18      PT LONG TERM GOAL #4   Title  Improve lumbar extension AROM to 50% of normal with no increase in pain in all planes except intermittent end range discomfort to allow patient to complete valued activities with less difficulty and prevent loss of ambulatory function.     Baseline  0% lumbar extension (07/27/2018);    Time  6    Period  Weeks    Status  New    Target Date  09/07/18      PT LONG TERM GOAL #5   Title  Reduce pain with functional activities to equal or less than 1/10 to allow patient to complete usual activities including ADLs, IADLs, and social engagement with less difficulty.     Baseline  during functional activity 3/10 (07/27/2018);    Time  6    Period  Weeks    Status  New    Target Date  09/07/18            Plan - 08/02/18 1152    Clinical Impression Statement  Pt tolerated treatment well. Pt was able to complete all exercises with minimal to no lasting increase in pain or discomfort. He reported mild decrease in tingling when asked throughout visit and never complained of pain or increased tingling at any time. Reported fatigue after squats and repeated remarked he felt his balance is poor. Pt demo good tolerance to exercise and while his balance is diminished he demonstrates good compensation strategies and motivation work on balance. He does not appear to be high risk for falls at this point.  Pt required cuing for proper technique and to facilitate improved neuromuscular control, strength, range of motion, and functional ability. Pattern of symptoms does not appear to support spinal stenosis at this point. Pt reports legs feel heavy when he has worked for a while. Consider testing for symptoms of vascular claudication at  next visit.     Rehab Potential  Fair    Clinical Impairments Affecting Rehab Potential  (+) generally healthy, motivated, leads active lifestyle; (-) chronic nature of sympotms,  peripheral neuropathy altered sensations largely unresponsive to PT, pt already with good activity level.     PT Frequency  2x / week    PT Duration  6 weeks    PT Treatment/Interventions  ADLs/Self Care Home Management;Biofeedback;Moist Heat;Cryotherapy;Gait training;Stair training;Functional mobility training;Therapeutic activities;Therapeutic exercise;Balance training;Neuromuscular re-education;Patient/family education;Manual techniques;Passive range of motion;Spinal Manipulations;Joint Manipulations;Other (comment)   joint mobilizations grades I-V   PT Next Visit Plan  progress functional strengthening/conditioning as tolerated. Consider testing for symptoms of vascular claudication at next visit    PT Home Exercise Plan  Not yet provided.     Consulted and Agree with Plan of Care  Patient       Patient will benefit from skilled therapeutic intervention in order to improve the following deficits and impairments:  Decreased balance, Decreased endurance, Decreased mobility, Difficulty walking, Impaired sensation, Postural dysfunction, Impaired perceived functional ability, Decreased range of motion, Decreased activity tolerance, Decreased strength, Pain  Visit Diagnosis: Paresthesia of skin  Difficulty in walking, not elsewhere classified  Pain in right ankle and joints of right foot  Pain in left ankle and joints of left foot     Problem List There are no active problems to display for this patient.   Nancy Nordmann, PT, DPT 08/02/2018, 11:55 AM  [facility] [address], [place], [address] Phone: [phone]   Fax:  [phone]  Name: Kazim Corrales MRN: [id number] Date of Birth: 06-29-1946

## 2018-08-03 DIAGNOSIS — R200 Anesthesia of skin: Secondary | ICD-10-CM | POA: Diagnosis not present

## 2018-08-03 DIAGNOSIS — E039 Hypothyroidism, unspecified: Secondary | ICD-10-CM | POA: Diagnosis not present

## 2018-08-03 DIAGNOSIS — E785 Hyperlipidemia, unspecified: Secondary | ICD-10-CM | POA: Diagnosis not present

## 2018-08-03 DIAGNOSIS — I119 Hypertensive heart disease without heart failure: Secondary | ICD-10-CM | POA: Diagnosis not present

## 2018-08-04 ENCOUNTER — Encounter: Payer: Self-pay | Admitting: Physical Therapy

## 2018-08-04 ENCOUNTER — Ambulatory Visit: Payer: Medicare Other | Admitting: Physical Therapy

## 2018-08-04 DIAGNOSIS — M25571 Pain in right ankle and joints of right foot: Secondary | ICD-10-CM

## 2018-08-04 DIAGNOSIS — R202 Paresthesia of skin: Secondary | ICD-10-CM

## 2018-08-04 DIAGNOSIS — R262 Difficulty in walking, not elsewhere classified: Secondary | ICD-10-CM

## 2018-08-04 DIAGNOSIS — M25572 Pain in left ankle and joints of left foot: Secondary | ICD-10-CM | POA: Diagnosis not present

## 2018-08-04 NOTE — Therapy (Signed)
[facility] [address], [place], [address] Phone: [phone]   Fax:  [phone]  Physical Therapy Treatment  Patient Details  Name: Adam Castillo MRN: [id number] Date of Birth: [birth year] Referring Provider (PT): Edrick Kins, [place]   Encounter Date: 08/04/2018  PT End of Session - 08/04/18 2034    Visit Number  3    Number of Visits  12    Date for PT Re-Evaluation  09/07/18    Authorization Type  Medicare    Authorization Time Period  Current Cert period: 13/24/4010 - 09/07/2018    Authorization - Visit Number  3    Authorization - Number of Visits  10    PT Start Time  1030    PT Stop Time  1115    PT Time Calculation (min)  45 min    Activity Tolerance  Patient tolerated treatment well;Patient limited by fatigue    Behavior During Therapy  [facility] for tasks assessed/performed       History reviewed. No pertinent past medical history.  History reviewed. No pertinent surgical history.  There were no vitals filed for this visit.  Subjective Assessment - 08/04/18 1034    Subjective  Patient reports he feels no significant change in his condition. He has 2/10 tingling in bilateral feet. No adverse response from previous visits. Performing HEP as prescribed.     Pertinent History  Patient is a 72 y.o. male who presents to outpatient physical therapy with a referral for generalized foot and ankle stiffness. This patient's chief complaints consist of a "stiff" feeling and numbness and tingling in bilateral feet,, leading to the following functional deficits: difficulty with prolonged weight bearing and walking tasks. Relevant past medical history and comorbidities include hypothyroidism, hypertension. PCP is Scientist, physiological from [facility]. Pt states he had bloodwork done in the last 6 months and was informed it was very good Imaging:  in office radiographs bilateral ankles 06/29/18 reported by provider  "Normal osseous mineralization. Joint spaces preserved. No fracture/dislocation/boney destruction."    Limitations  Walking    How long can you sit comfortably?  unlimited    How long can you walk comfortably?  1/8 mile, then good to go afte 30 second break    Currently in Pain?  Other (Comment)   tingling 2/10 in bilateral feet   Pain Location  Ankle    Pain Orientation  Right;Left    Pain Descriptors / Indicators  Numbness;Tingling    Pain Type  Chronic pain    Pain Onset  More than a month ago    Pain Frequency  Intermittent        PT Education - 08/04/18 2033    Education Details  cuing for exercise/purpose of exercise    Person(s) Educated  Patient    Methods  Explanation;Demonstration    Comprehension  Verbalized understanding;Returned demonstration     TREATMENT:   Therapeutic activities: for functional strengthening and improved functional activity tolerance. Also to test production of symptoms in weight bearing, non-weight bearing, and stooped vs natural positions in order to better guide treatment.  - NuStep level 7, 80 steps per minute using bilateral upper and lower extremities. Seat setting 9. For 4 min. HR = 112 Short rest between NuStep bouts.  - NuStep level 3 at 100 steps per minute over 4 min. Reported mild leg stiffness, increased to 110 steps per minute, reported increased leg stiffness. HR = 112 following.  Reports RPE 7/10 and leg stiffness/pain the highest. Reduced to level 3 at 80 steps per min. Reports leg stiffness/pain decreased. HR continued at 112 BPM.  5 min rest between NuStep and treadmill.  - Treadmill at 2.3 mph with no grade and BUE support x 5 min. Pt remains stooped. Reports up to 5/10 RPE, 4/10 leg pain/stiffness. HR = 120 BPM. Short standing rest between TM and clinic walking = pt reports pain receeding.  - 4 min walk around clinic at max speed. Reports 6/10 leg pain/stiffness. HR remains 120 BP - Completed Dynamic Gait Index to further assess  fall risk: scored 23/24 (low fall risk).   HOME EXERCISE PROGRAM Access Code: [email or web address]  URL: [email or web address]  Date: 08/02/2018  Prepared by: Rosita Kea   Exercises   Standing Lumbar Extension with Counter - 10-15 reps - 1 sets - 1 second hold - 4x daily   Squat with Chair Touch - 10-15 reps - 1 second hold - 3 Sets - 1x daily - 7x weekly   Single Leg Balance Alphabet - 2 Sets - 1x daily - 7x weekly   Heel Raise - 10-15 reps - 1 second hold - 3 Sets - 1x daily - 7x weekly   Patient response to treatment:  Pt tolerated treatment well. Pt was able to complete all exercises with minimal to no lasting increase in pain or discomfort. His symptoms were able to be provoked today with any intense aerobic exertion. It appears the greater the exertion, the more intense the "stiff" painful feeling and paresthesia. Pt also seemed to have slightly more pain/"stiffness"/paresthesia with weight bearing vs nonweightbearing positions. Position of spine did not seem to influence symptoms. Pt continues to be unable to stand up completely straight due to lack of lumbar extension. Patient was able to complete all tasks on DGI without difficulty except slight hesitation prior to stepping over shoe-box. Based on pt's performance today, his symptoms do not appear to be consistent with spinal stenosis. Vascular claudication was considered as well and is unable to be ruled out but is not consistent with the strong pedal pulses noted at initial eval. Pt required cuing for proper technique and to facilitate improved neuromuscular control, strength, range of motion, and functional ability. Pattern of symptoms does not appear to support spinal stenosis at this point.   PT Short Term Goals - 07/27/18 1818      PT SHORT TERM GOAL #1   Title  Be independent with home exercise program completed at least 3 times per week for self-management of symptoms.    Baseline  Plan to give initial HEP 2nd visit  (07/27/2018).    Time  2    Period  Weeks    Status  New    Target Date  08/10/18        PT Long Term Goals - 07/28/18 0928      PT LONG TERM GOAL #1   Title  Be independent with a long-term home exercise program for self-management of symptoms.     Baseline  Initial HEP to be provided at visit 2. (07/27/2018);     Time  6    Period  Weeks    Status  New    Target Date  09/07/18      PT LONG TERM GOAL #2   Title  Complete community, work and/or recreational activities without limitation due to current condition.     Baseline  Limited in walking and sleeping (07/27/2018);    Time  6    Period  Weeks    Status  New    Target Date  09/07/18      PT LONG TERM GOAL #3   Title  Patient will improve single leg stance on left and right to equal or greater than 10 seconds to demonstrate improved static balance to reduce fall risk.     Baseline  Single leg stance, firm surface, eyes open: R= <4 seconds, L= <4 seconds (07/27/2018);     Time  6    Period  Weeks    Status  New    Target Date  09/07/18      PT LONG TERM GOAL #4   Title  Improve lumbar extension AROM to 50% of normal with no increase in pain in all planes except intermittent end range discomfort to allow patient to complete valued activities with less difficulty and prevent loss of ambulatory function.     Baseline  0% lumbar extension (07/27/2018);    Time  6    Period  Weeks    Status  New    Target Date  09/07/18      PT LONG TERM GOAL #5   Title  Reduce pain with functional activities to equal or less than 1/10 to allow patient to complete usual activities including ADLs, IADLs, and social engagement with less difficulty.     Baseline  during functional activity 3/10 (07/27/2018);    Time  6    Period  Weeks    Status  New    Target Date  09/07/18            Plan - 08/04/18 2108    Clinical Impression Statement  Pt tolerated treatment well. Pt was able to complete all exercises with minimal to no  lasting increase in pain or discomfort. His symptoms were able to be provoked today with any intense aerobic exertion. It appears the greater the exertion, the more intense the "stiff" painful feeling and paresthesia. Pt also seemed to have slightly more pain/"stiffness"/paresthesia with weight bearing vs nonweightbearing positions. Position of spine did not seem to influence symptoms. Pt continues to be unable to stand up completely straight due to lack of lumbar extension. Patient was able to complete all tasks on DGI without difficulty except slight hesitation prior to stepping over shoe-box. Based on pt's performance today, his symptoms do not appear to be consistent with spinal stenosis. Vascular claudication was considered as well and is unable to be ruled out but is not consistent with the strong pedal pulses noted at initial eval. Pt required cuing for proper technique and to facilitate improved neuromuscular control, strength, range of motion, and functional ability. Pattern of symptoms does not appear to support spinal stenosis at this point    Rehab Potential  Fair    Clinical Impairments Affecting Rehab Potential  (+) generally healthy, motivated, leads active lifestyle; (-) chronic nature of sympotms, peripheral neuropathy altered sensations largely unresponsive to PT, pt already with good activity level.     PT Frequency  2x / week    PT Duration  6 weeks    PT Treatment/Interventions  ADLs/Self Care Home Management;Biofeedback;Moist Heat;Cryotherapy;Gait training;Stair training;Functional mobility training;Therapeutic activities;Therapeutic exercise;Balance training;Neuromuscular re-education;Patient/family education;Manual techniques;Passive range of motion;Spinal Manipulations;Joint Manipulations;Other (comment)   joint mobilizations grades I-V   PT Next Visit Plan  progress functional strengthening/conditioning/balance training as tolerated.    PT Home Exercise Plan  Not yet provided.      Consulted and Agree with Plan  of Care  Patient       Patient will benefit from skilled therapeutic intervention in order to improve the following deficits and impairments:  Decreased balance, Decreased endurance, Decreased mobility, Difficulty walking, Impaired sensation, Postural dysfunction, Impaired perceived functional ability, Decreased range of motion, Decreased activity tolerance, Decreased strength, Pain  Visit Diagnosis: Paresthesia of skin  Difficulty in walking, not elsewhere classified  Pain in right ankle and joints of right foot  Pain in left ankle and joints of left foot     Problem List There are no active problems to display for this patient.   Nancy Nordmann, PT, DPT 08/04/2018, 9:10 PM  [facility] [address] [place], [address] Phone: [phone]   Fax:  [phone]  Name: Margaret Staggs MRN: [id number] Date of Birth: [birth year]

## 2018-08-09 ENCOUNTER — Ambulatory Visit: Payer: Medicare Other | Admitting: Physical Therapy

## 2018-08-09 ENCOUNTER — Encounter: Payer: Self-pay | Admitting: Physical Therapy

## 2018-08-09 DIAGNOSIS — R202 Paresthesia of skin: Secondary | ICD-10-CM | POA: Diagnosis not present

## 2018-08-09 DIAGNOSIS — R262 Difficulty in walking, not elsewhere classified: Secondary | ICD-10-CM

## 2018-08-09 DIAGNOSIS — M25572 Pain in left ankle and joints of left foot: Secondary | ICD-10-CM

## 2018-08-09 DIAGNOSIS — M25571 Pain in right ankle and joints of right foot: Secondary | ICD-10-CM | POA: Diagnosis not present

## 2018-08-09 NOTE — Therapy (Signed)
[place] [facility] [address], [place], [address] Phone: [phone]   Fax:  [phone]  Physical Therapy Treatment  Patient Details  Name: Adam Castillo MRN: [id number] Date of Birth: December 21, 1945 Referring Provider (PT): Edrick Kins, [place]   Encounter Date: 08/09/2018  PT End of Session - 08/09/18 1042    Visit Number  4    Number of Visits  12    Date for PT Re-Evaluation  09/07/18    Authorization Type  Medicare    Authorization Time Period  Current Cert period: 44/09/270 - 09/07/2018    Authorization - Visit Number  4    Authorization - Number of Visits  10    PT Start Time  5366    PT Stop Time  1120    PT Time Calculation (min)  45 min    Activity Tolerance  Patient tolerated treatment well    Behavior During Therapy  [facility] for tasks assessed/performed       History reviewed. No pertinent past medical history.  History reviewed. No pertinent surgical history.  There were no vitals filed for this visit.  Subjective Assessment - 08/09/18 1039    Subjective  Patient reports mild numbness in bilateral feet and no significant change in his condition. He reports no lasting soreness, pain, or increased symptoms after last session. States he continues to dance and work without incresed diffiuclty.     Pertinent History  Patient is a 72 y.o. male who presents to outpatient physical therapy with a referral for generalized foot and ankle stiffness. This patient's chief complaints consist of a "stiff" feeling and numbness and tingling in bilateral feet,, leading to the following functional deficits: difficulty with prolonged weight bearing and walking tasks. Relevant past medical history and comorbidities include hypothyroidism, hypertension. PCP is Scientist, physiological from [facility]. Pt states he had bloodwork done in the last 6 months and was informed it was very good Imaging:  in office radiographs bilateral  ankles 06/29/18 reported by provider "Normal osseous mineralization. Joint spaces preserved. No fracture/dislocation/boney destruction."    Limitations  Walking    How long can you sit comfortably?  unlimited    How long can you walk comfortably?  1/8 mile, then good to go afte 30 second break    Currently in Pain?  Other (Comment)   3/10 tingling in bilateral feet   Pain Location  Foot    Pain Orientation  Left;Right    Pain Descriptors / Indicators  Tingling;Numbness    Pain Type  Chronic pain    Pain Onset  More than a month ago    Pain Frequency  Constant         PT Education - 08/09/18 1042    Education Details  cuing for exercises/purpose of exercises    Person(s) Educated  Patient    Methods  Explanation;Demonstration;Verbal cues    Comprehension  Verbalized understanding;Returned demonstration      TREATMENT: Therapeutic exercise: to centralize symptoms and improve ROM and strength required for successful completion of functional activities.   - NuStep HIIT level 6 at least 110 steps per min for 3.5 min, then alternating 1 min/1 min work (lvl6 100 steps per min/rest(lvl1 self selected steps per min) for 2 more intervals, then rest for 3.5 min (ended at 12 min total). using bilateral upper and lower extremities. Seat setting 9.  Peak HR = 144 bpm. Reported no chest pain, reported heaviness in  BLE at peak effort.   Therapeutic activities: for functional strengthening and improved functional activity tolerance. - alternating lunges onto BOSU ball x 10 each side. - step ups to BOSU ball x 10 each side CGA with gait belt for safety.  - squats with buttocks tap on 18 inch chair, x 20   Neuromuscular Re-education: to improve balance, postural strength, muscle activation patterns, and stabilization strength required for functional activities: - Completed Berg Balance Scale: 54/56 (low fall risk). Demo L SLS = >30 seconds; R SLS = 14 seconds. - tandem walking on aeromat x 5 feet  forward, back, and to each side x 10 with CGA and gait belt. Mirror biofeedback for foot placement without looking down.  - 6 inch hurdles while on aeromat x 6 hurdles forward. To improve balance.   HOME EXERCISE PROGRAM Access Code: [email or web address]  URL: [email or web address]  Date: 08/02/2018  Prepared by: Rosita Kea   Exercises   Standing Lumbar Extension with Counter - 10-15 reps - 1 sets - 1 second hold - 4x daily   Squat with Chair Touch - 10-15 reps - 1 second hold - 3 Sets - 1x daily - 7x weekly   Single Leg Balance Alphabet - 2 Sets - 1x daily - 7x weekly   Heel Raise - 10-15 reps - 1 second hold - 3 Sets - 1x daily - 7x weekly  Patient response to treatment:  Pt tolerated treatment well. Pt was able to complete all exercises with minimal to no lasting increase in pain or discomfort. His symptoms continued to be provoked today with any intense aerobic exertion. HIIT training was initiated with no adverse effect to improve activity tolerance and decrease LE symptoms when performing daily activities. Patient was able to complete all tasks on Berg Balance Index without difficulty except single leg and tandem balance tasks. Discussed potential effect of PT on pt's chief complaint and he elected to continue with physical therapy to improve balance and activity tolerance. Pt required cuing for proper technique and to facilitate improved neuromuscular control, strength, range of motion, and functional ability. Patient is making progress towards goals at this point.    PT Short Term Goals - 07/27/18 1818      PT SHORT TERM GOAL #1   Title  Be independent with home exercise program completed at least 3 times per week for self-management of symptoms.    Baseline  Plan to give initial HEP 2nd visit (07/27/2018).    Time  2    Period  Weeks    Status  New    Target Date  08/10/18        PT Long Term Goals - 07/28/18 0928      PT LONG TERM GOAL #1   Title  Be independent  with a long-term home exercise program for self-management of symptoms.     Baseline  Initial HEP to be provided at visit 2. (07/27/2018);     Time  6    Period  Weeks    Status  New    Target Date  09/07/18      PT LONG TERM GOAL #2   Title  Complete community, work and/or recreational activities without limitation due to current condition.     Baseline  Limited in walking and sleeping (07/27/2018);    Time  6    Period  Weeks    Status  New    Target Date  09/07/18      PT LONG TERM GOAL #  3   Title  Patient will improve single leg stance on left and right to equal or greater than 10 seconds to demonstrate improved static balance to reduce fall risk.     Baseline  Single leg stance, firm surface, eyes open: R= <4 seconds, L= <4 seconds (07/27/2018);     Time  6    Period  Weeks    Status  New    Target Date  09/07/18      PT LONG TERM GOAL #4   Title  Improve lumbar extension AROM to 50% of normal with no increase in pain in all planes except intermittent end range discomfort to allow patient to complete valued activities with less difficulty and prevent loss of ambulatory function.     Baseline  0% lumbar extension (07/27/2018);    Time  6    Period  Weeks    Status  New    Target Date  09/07/18      PT LONG TERM GOAL #5   Title  Reduce pain with functional activities to equal or less than 1/10 to allow patient to complete usual activities including ADLs, IADLs, and social engagement with less difficulty.     Baseline  during functional activity 3/10 (07/27/2018);    Time  6    Period  Weeks    Status  New    Target Date  09/07/18            Plan - 08/09/18 1152    Clinical Impression Statement  Pt tolerated treatment well. Pt was able to complete all exercises with minimal to no lasting increase in pain or discomfort. His symptoms continued to be provoked today with any intense aerobic exertion. HIIT training was initiated with no adverse effect to improve activity  tolerance and decrease LE symptoms when performing daily activities. Patient was able to complete all tasks on Berg Balance Index without difficulty except single leg and tandem balance tasks. Discussed potential effect of PT on pt's chief complaint and he elected to continue with physical therapy to improve balance and activity tolerance. Pt required cuing for proper technique and to facilitate improved neuromuscular control, strength, range of motion, and functional ability. Patient is making progress towards goals at this point.     Rehab Potential  Fair    Clinical Impairments Affecting Rehab Potential  (+) generally healthy, motivated, leads active lifestyle; (-) chronic nature of sympotms, peripheral neuropathy altered sensations largely unresponsive to PT, pt already with good activity level.     PT Frequency  2x / week    PT Duration  6 weeks    PT Treatment/Interventions  ADLs/Self Care Home Management;Biofeedback;Moist Heat;Cryotherapy;Gait training;Stair training;Functional mobility training;Therapeutic activities;Therapeutic exercise;Balance training;Neuromuscular re-education;Patient/family education;Manual techniques;Passive range of motion;Spinal Manipulations;Joint Manipulations;Other (comment)   joint mobilizations grades I-V   PT Next Visit Plan  progress functional strengthening/conditioning/balance training as tolerated.    PT Home Exercise Plan  Medbridge Access Code: [email or web address]     Consulted and Agree with Plan of Care  Patient       Patient will benefit from skilled therapeutic intervention in order to improve the following deficits and impairments:  Decreased balance, Decreased endurance, Decreased mobility, Difficulty walking, Impaired sensation, Postural dysfunction, Impaired perceived functional ability, Decreased range of motion, Decreased activity tolerance, Decreased strength, Pain  Visit Diagnosis: Paresthesia of skin  Difficulty in walking, not elsewhere  classified  Pain in right ankle and joints of right foot  Pain in left ankle and joints of  left foot     Problem List There are no active problems to display for this patient.   Nancy Nordmann, PT, DPT 08/09/2018, 11:55 AM  [place] [facility] [address], [place], [address] Phone: [phone]   Fax:  [phone]  Name: Adam Castillo MRN: [id number] Date of Birth: Feb 26, 1946

## 2018-08-10 ENCOUNTER — Encounter: Payer: Self-pay | Admitting: Podiatry

## 2018-08-10 ENCOUNTER — Ambulatory Visit (INDEPENDENT_AMBULATORY_CARE_PROVIDER_SITE_OTHER): Payer: Medicare Other | Admitting: Podiatry

## 2018-08-10 DIAGNOSIS — M25673 Stiffness of unspecified ankle, not elsewhere classified: Secondary | ICD-10-CM

## 2018-08-10 DIAGNOSIS — M659 Synovitis and tenosynovitis, unspecified: Secondary | ICD-10-CM

## 2018-08-11 NOTE — Progress Notes (Signed)
   HPI: 72 year old male presenting today for follow up evaluation of bilateral foot and ankle pain. He states he is doing well but reports some stiffness of the toes. He reports associated numbness. He denies modifying factors. He has been doing physical therapy and states that has helped improve his symptoms. Patient is here for further evaluation and treatment.   History reviewed. No pertinent past medical history.   Physical Exam: General: The patient is alert and oriented x3 in no acute distress.  Dermatology: Skin is warm, dry and supple bilateral lower extremities. Negative for open lesions or macerations.  Vascular: Palpable pedal pulses bilaterally. No edema or erythema noted. Capillary refill within normal limits.  Neurological: Epicritic and protective threshold grossly intact bilaterally.   Musculoskeletal Exam: Pain with palpation noted to the anterior, lateral and medial aspects of bilateral ankle joints. Range of motion within normal limits to all pedal and ankle joints bilateral. Muscle strength 5/5 in all groups bilateral.   Assessment: 1. Generalized foot and ankle stiffness bilateral  2. Ankle synovitis bilateral    Plan of Care:  1. Patient evaluated.  2. Injection of 0.5 mLs Celestone Soluspan injected into bilateral ankle joints.  3. Continue physical therapy as needed.  4. Recommended good shoe gear.  5. Return to clinic as needed.      Edrick Kins, DPM [facility]  Dr. Edrick Kins, DPM    [address], [place] [address]                Office [phone]  Fax [phone]

## 2018-08-17 ENCOUNTER — Ambulatory Visit: Payer: Medicare Other | Attending: Podiatry | Admitting: Physical Therapy

## 2018-08-17 DIAGNOSIS — M25572 Pain in left ankle and joints of left foot: Secondary | ICD-10-CM | POA: Insufficient documentation

## 2018-08-17 DIAGNOSIS — M25571 Pain in right ankle and joints of right foot: Secondary | ICD-10-CM

## 2018-08-17 DIAGNOSIS — R262 Difficulty in walking, not elsewhere classified: Secondary | ICD-10-CM | POA: Insufficient documentation

## 2018-08-17 DIAGNOSIS — R202 Paresthesia of skin: Secondary | ICD-10-CM | POA: Diagnosis not present

## 2018-08-17 NOTE — Therapy (Signed)
[place] [facility] [address] [place], [address] Phone: [phone]   Fax:  [phone]  Physical Therapy Treatment/Discharge Summary Reporting period: 07/27/2018 - 08/17/2018  Patient Details  Name: Adam Castillo MRN: [id number] Date of Birth: [birth year] Referring Provider (PT): Edrick Kins, [place]   Encounter Date: 08/17/2018  PT End of Session - 08/18/18 0804    Visit Number  5    Number of Visits  12    Date for PT Re-Evaluation  09/07/18    Authorization Type  Medicare    Authorization Time Period  Current Cert period: 69/62/9528 - 09/07/2018    Authorization - Visit Number  5    Authorization - Number of Visits  10    PT Start Time  4132    PT Stop Time  1120    PT Time Calculation (min)  45 min    Equipment Utilized During Treatment  Gait belt    Activity Tolerance  Patient tolerated treatment well;Patient limited by fatigue    Behavior During Therapy  [facility] for tasks assessed/performed       No past medical history on file.  No past surgical history on file.  There were no vitals filed for this visit.  Subjective Assessment - 08/18/18 0759    Subjective  Patient continues to report mild constant numbness in bilateral feet and no significant change in condition. He reports he returned to his referring physician who gave him an injection in both ankles. He is unable to describe why this was performed. Documentation reveals that it was a steroid injection. He reports no significant change in his condition or cheif compaint. He reports at end of session that he feels ready to discharge from skilled physical therapy and is confident in continuing his usual fitness and wellness routine independently.  He states he [place] walking [place] due [place] but may go to the mall to walk. Stated he continues to dance 3x per week.     Pertinent History  Patient is a 72 y.o. male who presents to outpatient  physical therapy with a referral for generalized foot and ankle stiffness. This patient's chief complaints consist of a "stiff" feeling and numbness and tingling in bilateral feet,, leading to the following functional deficits: difficulty with prolonged weight bearing and walking tasks. Relevant past medical history and comorbidities include hypothyroidism, hypertension. PCP is Scientist, physiological from [facility]. Pt states he had bloodwork done in the last 6 months and was informed it was very good Imaging:  in office radiographs bilateral ankles 06/29/18 reported by provider "Normal osseous mineralization. Joint spaces preserved. No fracture/dislocation/boney destruction."    Limitations  Walking    How long can you sit comfortably?  unlimited    How long can you walk comfortably?  1/8 mile, then good to go after 30 second break    Currently in Pain?  Other (Comment)   mild tingling in bilateral feet, constant   Pain Location  Foot    Pain Orientation  Right;Left    Pain Descriptors / Indicators  Tingling;Numbness    Pain Type  Chronic pain    Pain Onset  More than a month ago    Pain Frequency  Constant    Aggravating Factors   walking, prolonged weight bearing, working construction    Pain Relieving Factors  sitting, hot shower    Effect of Pain on Daily Activities  limits dancing to  2 songs at a time, limits how far he can walk but is able to continue after a very short rest         PT Education - 08/18/18 0804    Education Details  POC, instructions for gradual progression of walking/dancing duration, instructions for continued independent care, cuing for exercise technique    Person(s) Educated  Patient    Methods  Explanation;Demonstration;Tactile cues;Verbal cues    Comprehension  Verbalized understanding;Returned demonstration       TREATMENT: Therapeutic exercise: to centralize symptoms and improve ROM and strength required for successful completion of functional  activities.   -NuStep HIIT level 6 at least 65 steps per min for 3 min, then alternating 1 min/1 min work (lvl6 100 steps per min/rest(lvl1 self selected steps per min) for 4 intervals, then rest for 3 min (ended at 13 min total). using bilateral upper and lower extremities. Seat setting 9.  Peak HR = 132 bpm. Reported no chest pain but limited by difficulty breathing (reports chest cold last week), reported no  heaviness in BLE at peak effort.  - Education on diagnosis, prognosis, POC, anatomy and physiology of current condition.   Therapeutic activities: for functional strengthening and improved functional activity tolerance. - step ups to BOSU ball x 10 each side CGA with gait belt for safety.  - double foot hopping, forward, diagonal, sideways and back; 2x 3 each direction. SBA x 1 for safety.  - step up to 6 inch step with SLS balance at top of step and 20# kettelbell in contralateral hand to improve balance and LE strength. X 10 each side.   Neuromuscular Re-education: to improve balance, postural strength, muscle activation patterns, and stabilization strength required for functional activities: - stepping over 6" hurdles with one foot then back, in star pattern (FW, diagonal front, diagonal back, back, and crossed behind) x 5 each direction with CGA x 1 for safety.  - squats on BOSU ball (flat side up) with SBA - min A for safety and BUE touchdown support, 2 x 20    HOME EXERCISE PROGRAM Access Code: [email or web address]  URL: [email or web address]  Date: 08/02/2018  Prepared by: Rosita Kea   Exercises   Standing Lumbar Extension with Counter - 10-15 reps - 1 sets - 1 second hold - 4x daily   Squat with Chair Touch - 10-15 reps - 1 second hold - 3 Sets - 1x daily - 7x weekly   Single Leg Balance Alphabet - 2 Sets - 1x daily - 7x weekly   Heel Raise - 10-15 reps - 1 second hold - 3 Sets - 1x daily - 7x weekly  Patient response to treatment:  Pt tolerated treatment well.  Pt was able to complete all exercises with minimal to no lasting increase in pain or discomfort.His symptoms continued were not provoked today with HIIT training but he was more limited by breathing that was distracting him from his legs. He demo no adverse reaction to HIIT training and it was continued to improve activity tolerance and decrease LE symptoms when performing daily activities. Patient was able to complete complex, high level exercises requiring strength, coordination, and endurance successfully.  Pt required cuing for proper technique and to facilitate improved neuromuscular control, strength, range of motion, and functional ability. Patient is making progress towards goals at this point. He elected to discontinue PT after today's treatment due to his excellent ability and his confidence at continuing his fitness program at home.   PT Short Term Goals -  08/18/18 0805      PT SHORT TERM GOAL #1   Title  Be independent with home exercise program completed at least 3 times per week for self-management of symptoms.    Baseline  Plan to give initial HEP 2nd visit (07/27/2018).    Time  2    Period  Weeks    Status  Achieved    Target Date  08/10/18        PT Long Term Goals - 08/18/18 0805      PT LONG TERM GOAL #1   Title  Be independent with a long-term home exercise program for self-management of symptoms.     Baseline  Initial HEP to be provided at visit 2. (07/27/2018);     Time  6    Period  Weeks    Status  Achieved    Target Date  09/07/18      PT LONG TERM GOAL #2   Title  Complete community, work and/or recreational activities without limitation due to current condition.     Baseline  Limited in walking and sleeping (07/27/2018); continues to be mildly limited in walking and sleeping (08/17/2018)    Time  6    Period  Weeks    Status  Not Met    Target Date  09/07/18      PT LONG TERM GOAL #3   Title  Patient will improve single leg stance on left and right to  equal or greater than 10 seconds to demonstrate improved static balance to reduce fall risk.     Baseline  Single leg stance, firm surface, eyes open: R= <4 seconds, L= <4 seconds (07/27/2018); L SLS = >30 seconds; R SLS = 14 seconds (08/09/2018)    Time  6    Period  Weeks    Status  Achieved    Target Date  09/07/18      PT LONG TERM GOAL #4   Title  Improve lumbar extension AROM to 50% of normal with no increase in pain in all planes except intermittent end range discomfort to allow patient to complete valued activities with less difficulty and prevent loss of ambulatory function.     Baseline  0% lumbar extension (07/27/2018); did not measure at discharge (pt reported while leaving that he was ready for discharge).     Time  6    Period  Weeks    Status  Unable to assess    Target Date  09/07/18      PT LONG TERM GOAL #5   Title  Reduce pain with functional activities to equal or less than 1/10 to allow patient to complete usual activities including ADLs, IADLs, and social engagement with less difficulty.     Baseline  during functional activity 3/10 (07/27/2018); no change (09/07/2018);    Time  6    Period  Weeks    Status  Not Met    Target Date  09/07/18             Plan - 08/18/18 3825    Clinical Impression Statement  Patient has attended 5 treatment sessions and has made progress towards some goals. He has demonstrated low fall risk and excellent functional strength, balance, and endurance required for ADLs and IADLs. His symptoms that bother him most are most consistent with peripheral neuropathy or intermittent claudication that is not readily alterable with physical therapy. Patient has decided he is satisfied with his balance at this point and independent with  his fitness program and requested to discharge today. Patient is now discharged from skilled physical therapy due to lack of progress in chief complaint and meeting all balance goals while demonstrating  independence in long term HEP.    Rehab Potential  Fair    Clinical Impairments Affecting Rehab Potential  (+) generally healthy, motivated, leads active lifestyle; (-) chronic nature of sympotms, peripheral neuropathy altered sensations largely unresponsive to PT, pt already with good activity level.     PT Frequency  2x / week    PT Duration  6 weeks    PT Treatment/Interventions  ADLs/Self Care Home Management;Biofeedback;Moist Heat;Cryotherapy;Gait training;Stair training;Functional mobility training;Therapeutic activities;Therapeutic exercise;Balance training;Neuromuscular re-education;Patient/family education;Manual techniques;Passive range of motion;Spinal Manipulations;Joint Manipulations;Other (comment)   joint mobilizations grades I-V   PT Next Visit Plan  Patient is now disharged from skilled physical therapy.     PT Home Exercise Plan  Medbridge Access Code: [email or web address]     Consulted and Agree with Plan of Care  Patient       Patient will benefit from skilled therapeutic intervention in order to improve the following deficits and impairments:  Decreased balance, Decreased endurance, Decreased mobility, Difficulty walking, Impaired sensation, Postural dysfunction, Impaired perceived functional ability, Decreased range of motion, Decreased activity tolerance, Decreased strength, Pain  Visit Diagnosis: Paresthesia of skin  Difficulty in walking, not elsewhere classified  Pain in right ankle and joints of right foot  Pain in left ankle and joints of left foot     Problem List There are no active problems to display for this patient.   Nancy Nordmann, PT, DPT 08/18/2018, 8:17 AM  [place] [facility] [address] [place], [address] Phone: [phone]   Fax:  [phone]  Name: Coben Godshall MRN: [id number] Date of Birth: November 16, 1945

## 2018-08-19 ENCOUNTER — Ambulatory Visit: Payer: Medicare Other | Admitting: Physical Therapy

## 2018-08-23 ENCOUNTER — Ambulatory Visit: Payer: Medicare Other | Admitting: Physical Therapy

## 2018-08-26 ENCOUNTER — Encounter: Payer: Medicare Other | Admitting: Physical Therapy

## 2018-08-30 ENCOUNTER — Encounter: Payer: Medicare Other | Admitting: Physical Therapy

## 2018-09-02 ENCOUNTER — Encounter: Payer: Medicare Other | Admitting: Physical Therapy

## 2018-10-29 DIAGNOSIS — I119 Hypertensive heart disease without heart failure: Secondary | ICD-10-CM | POA: Diagnosis not present

## 2018-10-29 DIAGNOSIS — E785 Hyperlipidemia, unspecified: Secondary | ICD-10-CM | POA: Diagnosis not present

## 2018-10-29 DIAGNOSIS — N529 Male erectile dysfunction, unspecified: Secondary | ICD-10-CM | POA: Diagnosis not present

## 2018-10-29 DIAGNOSIS — E039 Hypothyroidism, unspecified: Secondary | ICD-10-CM | POA: Diagnosis not present

## 2019-01-28 DIAGNOSIS — N529 Male erectile dysfunction, unspecified: Secondary | ICD-10-CM | POA: Diagnosis not present

## 2019-01-28 DIAGNOSIS — E039 Hypothyroidism, unspecified: Secondary | ICD-10-CM | POA: Diagnosis not present

## 2019-01-28 DIAGNOSIS — E785 Hyperlipidemia, unspecified: Secondary | ICD-10-CM | POA: Diagnosis not present

## 2019-01-28 DIAGNOSIS — R200 Anesthesia of skin: Secondary | ICD-10-CM | POA: Diagnosis not present

## 2019-01-31 DIAGNOSIS — H9190 Unspecified hearing loss, unspecified ear: Secondary | ICD-10-CM | POA: Diagnosis not present

## 2019-01-31 DIAGNOSIS — H6123 Impacted cerumen, bilateral: Secondary | ICD-10-CM | POA: Diagnosis not present

## 2019-04-05 DIAGNOSIS — I119 Hypertensive heart disease without heart failure: Secondary | ICD-10-CM | POA: Diagnosis not present

## 2019-04-05 DIAGNOSIS — Z0000 Encounter for general adult medical examination without abnormal findings: Secondary | ICD-10-CM | POA: Diagnosis not present

## 2019-04-05 DIAGNOSIS — E785 Hyperlipidemia, unspecified: Secondary | ICD-10-CM | POA: Diagnosis not present

## 2019-04-05 DIAGNOSIS — N529 Male erectile dysfunction, unspecified: Secondary | ICD-10-CM | POA: Diagnosis not present

## 2019-04-05 DIAGNOSIS — E039 Hypothyroidism, unspecified: Secondary | ICD-10-CM | POA: Diagnosis not present

## 2019-04-06 DIAGNOSIS — E7849 Other hyperlipidemia: Secondary | ICD-10-CM | POA: Diagnosis not present

## 2019-04-06 DIAGNOSIS — Z125 Encounter for screening for malignant neoplasm of prostate: Secondary | ICD-10-CM | POA: Diagnosis not present

## 2019-04-06 DIAGNOSIS — I10 Essential (primary) hypertension: Secondary | ICD-10-CM | POA: Diagnosis not present

## 2019-04-11 DIAGNOSIS — E785 Hyperlipidemia, unspecified: Secondary | ICD-10-CM | POA: Diagnosis not present

## 2019-04-11 DIAGNOSIS — E039 Hypothyroidism, unspecified: Secondary | ICD-10-CM | POA: Diagnosis not present

## 2019-04-11 DIAGNOSIS — N529 Male erectile dysfunction, unspecified: Secondary | ICD-10-CM | POA: Diagnosis not present

## 2019-04-11 DIAGNOSIS — R200 Anesthesia of skin: Secondary | ICD-10-CM | POA: Diagnosis not present

## 2019-06-05 DIAGNOSIS — Z23 Encounter for immunization: Secondary | ICD-10-CM | POA: Diagnosis not present

## 2019-07-12 DIAGNOSIS — N529 Male erectile dysfunction, unspecified: Secondary | ICD-10-CM | POA: Diagnosis not present

## 2019-07-12 DIAGNOSIS — R200 Anesthesia of skin: Secondary | ICD-10-CM | POA: Diagnosis not present

## 2019-07-12 DIAGNOSIS — E039 Hypothyroidism, unspecified: Secondary | ICD-10-CM | POA: Diagnosis not present

## 2019-07-12 DIAGNOSIS — E785 Hyperlipidemia, unspecified: Secondary | ICD-10-CM | POA: Diagnosis not present

## 2019-08-10 ENCOUNTER — Other Ambulatory Visit: Payer: Self-pay

## 2019-10-12 DIAGNOSIS — E785 Hyperlipidemia, unspecified: Secondary | ICD-10-CM | POA: Diagnosis not present

## 2019-10-12 DIAGNOSIS — N529 Male erectile dysfunction, unspecified: Secondary | ICD-10-CM | POA: Diagnosis not present

## 2019-10-12 DIAGNOSIS — R200 Anesthesia of skin: Secondary | ICD-10-CM | POA: Diagnosis not present

## 2019-10-12 DIAGNOSIS — E039 Hypothyroidism, unspecified: Secondary | ICD-10-CM | POA: Diagnosis not present

## 2020-01-20 ENCOUNTER — Other Ambulatory Visit: Payer: Self-pay | Admitting: *Deleted

## 2020-01-20 MED ORDER — LEVOTHYROXINE SODIUM 50 MCG PO TABS: 50.0000 ug | ORAL_TABLET | Freq: Every day | ORAL | 6 refills | Status: DC

## 2020-02-07 ENCOUNTER — Encounter: Payer: Self-pay | Admitting: Internal Medicine

## 2020-02-07 ENCOUNTER — Other Ambulatory Visit: Payer: Self-pay

## 2020-02-07 ENCOUNTER — Ambulatory Visit (INDEPENDENT_AMBULATORY_CARE_PROVIDER_SITE_OTHER): Payer: Medicare Other | Admitting: Internal Medicine

## 2020-02-07 VITALS — BP 153/83 | HR 89 | Wt 201.0 lb

## 2020-02-07 DIAGNOSIS — I10 Essential (primary) hypertension: Secondary | ICD-10-CM

## 2020-02-07 DIAGNOSIS — E038 Other specified hypothyroidism: Secondary | ICD-10-CM

## 2020-02-07 DIAGNOSIS — E785 Hyperlipidemia, unspecified: Secondary | ICD-10-CM

## 2020-02-07 DIAGNOSIS — E063 Autoimmune thyroiditis: Secondary | ICD-10-CM

## 2020-02-07 MED ORDER — ALPHA-LIPOIC ACID 100 MG PO CAPS: 1.0000 | ORAL_CAPSULE | Freq: Every day | ORAL | 3 refills | Status: DC

## 2020-02-07 NOTE — Assessment & Plan Note (Addendum)
Stable.  The present time.

## 2020-02-07 NOTE — Progress Notes (Addendum)
 Established Patient Office Visit  Subjective:  Patient ID: Adam Castillo, male    DOB: 05/21/1946  Age: 73 y.o. MRN: [id number]  CC:  Chief Complaint  Patient presents with  . Hypertension    3 month fu on BP     HPI  Adam Castillo presents for general checkup.  History reviewed. No pertinent past medical history.  History reviewed. No pertinent surgical history.  History reviewed. No pertinent family history.  Social History   Socioeconomic History  . Marital status: Single    Spouse name: Not on file  . Number of children: Not on file  . Years of education: Not on file  . Highest education level: Not on file  Occupational History  . Not on file  Tobacco Use  . Smoking status: Never Smoker  . Smokeless tobacco: Never Used  Substance and Sexual Activity  . Alcohol use: Not on file  . Drug use: Not on file  . Sexual activity: Not on file  Other Topics Concern  . Not on file  Social History Narrative  . Not on file   Social Determinants of Health   Financial Resource Strain:   . Difficulty of Paying Living Expenses:   Food Insecurity:   . Worried About Running Out of Food in the Last Year:   . Ran Out of Food in the Last Year:   Transportation Needs:   . Lack of Transportation (Medical):   . Lack of Transportation (Non-Medical):   Physical Activity:   . Days of Exercise per Week:   . Minutes of Exercise per Session:   Stress:   . Feeling of Stress :   Social Connections:   . Frequency of Communication with Friends and Family:   . Frequency of Social Gatherings with Friends and Family:   . Attends Religious Services:   . Active Member of Clubs or Organizations:   . Attends Club or Organization Meetings:   . Marital Status:   Intimate Partner Violence:   . Fear of Current or Ex-Partner:   . Emotionally Abused:   . Physically Abused:   . Sexually Abused:      Current Outpatient Medications:  .  Alpha-Lipoic Acid 100 MG CAPS, Take 1 capsule (100 mg  total) by mouth daily., Disp: 90 capsule, Rfl: 3 .  aspirin EC 81 MG tablet, Take 81 mg by mouth daily., Disp: , Rfl:  .  levothyroxine (SYNTHROID) 50 MCG tablet, Take 1 tablet (50 mcg total) by mouth daily., Disp: 30 tablet, Rfl: 6 .  lisinopril (PRINIVIL,ZESTRIL) 40 MG tablet, Take 40 mg by mouth daily., Disp: , Rfl: 3 .  Multiple Vitamins-Minerals (CENTRUM SILVER 50+MEN PO), Take 1 tablet by mouth daily., Disp: , Rfl:  .  simvastatin (ZOCOR) 20 MG tablet, Take 20 mg by mouth daily., Disp: , Rfl: 3   No Known Allergies  ROS Review of Systems  Constitutional: Negative.   HENT: Negative.   Eyes: Negative.   Respiratory: Negative.   Cardiovascular: Negative.   Gastrointestinal: Negative.   Musculoskeletal: Negative.   Neurological: Negative.       Objective:    Physical Exam  Constitutional: He is oriented to person, place, and time. He appears well-developed and well-nourished.  HENT:  Head: Normocephalic and atraumatic.  Eyes: Pupils are equal, round, and reactive to light.  Neck: No JVD present. No tracheal deviation present.  Pulmonary/Chest: Breath sounds normal. He has no wheezes.  Abdominal: There is no abdominal tenderness.  Musculoskeletal:          General: No edema.     Cervical back: Normal range of motion.  Lymphadenopathy:    He has no cervical adenopathy.  Neurological: He is oriented to person, place, and time.    BP (!) 153/83   Pulse 89   Wt 201 lb (91.2 kg)  Wt Readings from Last 3 Encounters:  02/07/20 201 lb (91.2 kg)     Health Maintenance Due  Topic Date Due  . Hepatitis C Screening  Never done  . COVID-19 Vaccine (1) Never done  . TETANUS/TDAP  Never done  . COLONOSCOPY  Never done  . PNA vac Low Risk Adult (1 of 2 - PCV13) Never done    There are no preventive care reminders to display for this patient.  No results found for: TSH No results found for: WBC, HGB, HCT, MCV, PLT No results found for: NA, K, CHLORIDE, CO2, GLUCOSE, BUN,  CREATININE, BILITOT, ALKPHOS, AST, ALT, PROT, ALBUMIN, CALCIUM, ANIONGAP, EGFR, GFR No results found for: CHOL No results found for: HDL No results found for: LDLCALC No results found for: TRIG No results found for: CHOLHDL No results found for: HGBA1C    Assessment & Plan:   Problem List Items Addressed This Visit      Cardiovascular and Mediastinum   Essential hypertension - Primary    Stable.         Meds ordered this encounter  Medications  . Alpha-Lipoic Acid 100 MG CAPS    Sig: Take 1 capsule (100 mg total) by mouth daily.    Dispense:  90 capsule    Refill:  3     1. Essential hypertension Stable  2. Hypothyroidism due to Hashimoto's thyroiditis On thyroxine  3. Dyslipidemia Follow low-cholesterol diet.  Patient is non-smoker. Follow-up: Return in about 3 months (around 05/09/2020).    Adam Athens, MD

## 2020-02-10 DIAGNOSIS — E038 Other specified hypothyroidism: Secondary | ICD-10-CM | POA: Insufficient documentation

## 2020-02-10 DIAGNOSIS — E785 Hyperlipidemia, unspecified: Secondary | ICD-10-CM | POA: Insufficient documentation

## 2020-05-09 ENCOUNTER — Other Ambulatory Visit: Payer: Self-pay

## 2020-05-09 ENCOUNTER — Ambulatory Visit (INDEPENDENT_AMBULATORY_CARE_PROVIDER_SITE_OTHER): Payer: Medicare Other | Admitting: Internal Medicine

## 2020-05-09 ENCOUNTER — Encounter: Payer: Self-pay | Admitting: Internal Medicine

## 2020-05-09 VITALS — BP 146/85 | HR 94 | Ht 69.0 in | Wt 198.1 lb

## 2020-05-09 DIAGNOSIS — I10 Essential (primary) hypertension: Secondary | ICD-10-CM

## 2020-05-09 DIAGNOSIS — E038 Other specified hypothyroidism: Secondary | ICD-10-CM | POA: Diagnosis not present

## 2020-05-09 DIAGNOSIS — E785 Hyperlipidemia, unspecified: Secondary | ICD-10-CM

## 2020-05-09 DIAGNOSIS — M1711 Unilateral primary osteoarthritis, right knee: Secondary | ICD-10-CM | POA: Diagnosis not present

## 2020-05-09 DIAGNOSIS — E063 Autoimmune thyroiditis: Secondary | ICD-10-CM

## 2020-05-09 NOTE — Progress Notes (Addendum)
Established Patient Office Visit  SUBJECTIVE:  Subjective  Patient ID: Adam Castillo, male    DOB: 1946/08/01  Age: 74 y.o. MRN: [id number]  CC:  Chief Complaint  Patient presents with  . Hypertension    3 month follow up    HPI [name] is a 74 y.o. male presenting today for knee pain patient complaining of pain in the right knee.  No swelling of the leg.  He denies any chest pain or shortness of breath.  Patient does not smoke neither drink.  Can walk a block but complains of numbness in the legs and has to stop.  But it goes away with rest  History reviewed. No pertinent past medical history.  History reviewed. No pertinent surgical history.  History reviewed. No pertinent family history.  Social History   Socioeconomic History  . Marital status: Single    Spouse name: Not on file  . Number of children: Not on file  . Years of education: Not on file  . Highest education level: Not on file  Occupational History  . Not on file  Tobacco Use  . Smoking status: Never Smoker  . Smokeless tobacco: Never Used  Substance and Sexual Activity  . Alcohol use: Not on file  . Drug use: Not on file  . Sexual activity: Not on file  Other Topics Concern  . Not on file  Social History Narrative  . Not on file   Social Determinants of Health   Financial Resource Strain:   . Difficulty of Paying Living Expenses: Not on file  Food Insecurity:   . Worried About Charity fundraiser in the Last Year: Not on file  . Ran Out of Food in the Last Year: Not on file  Transportation Needs:   . Lack of Transportation (Medical): Not on file  . Lack of Transportation (Non-Medical): Not on file  Physical Activity:   . Days of Exercise per Week: Not on file  . Minutes of Exercise per Session: Not on file  Stress:   . Feeling of Stress : Not on file  Social Connections:   . Frequency of Communication with Friends and Family: Not on file  . Frequency of Social Gatherings with Friends  and Family: Not on file  . Attends Religious Services: Not on file  . Active Member of Clubs or Organizations: Not on file  . Attends Archivist Meetings: Not on file  . Marital Status: Not on file  Intimate Partner Violence:   . Fear of Current or Ex-Partner: Not on file  . Emotionally Abused: Not on file  . Physically Abused: Not on file  . Sexually Abused: Not on file     Current Outpatient Medications:  .  Alpha-Lipoic Acid 100 MG CAPS, Take 1 capsule (100 mg total) by mouth daily., Disp: 90 capsule, Rfl: 3 .  aspirin EC 81 MG tablet, Take 81 mg by mouth daily., Disp: , Rfl:  .  levothyroxine (SYNTHROID) 50 MCG tablet, Take 1 tablet (50 mcg total) by mouth daily., Disp: 30 tablet, Rfl: 6 .  lisinopril (PRINIVIL,ZESTRIL) 40 MG tablet, Take 40 mg by mouth daily., Disp: , Rfl: 3 .  Multiple Vitamins-Minerals (CENTRUM SILVER 50+MEN PO), Take 1 tablet by mouth daily., Disp: , Rfl:  .  simvastatin (ZOCOR) 20 MG tablet, Take 20 mg by mouth daily., Disp: , Rfl: 3   No Known Allergies  ROS Review of Systems  Constitutional: Negative.   HENT: Negative.  Eyes: Negative.   Respiratory: Negative.   Cardiovascular: Negative.  Negative for leg swelling.  Gastrointestinal: Negative.   Endocrine: Positive for polydipsia.  Genitourinary: Negative.   Musculoskeletal: Positive for arthralgias (rt knee hurt) and myalgias.  Skin: Negative.   Allergic/Immunologic: Positive for food allergies.  Neurological: Positive for speech difficulty.  Hematological: Negative.   Psychiatric/Behavioral: Positive for behavioral problems and confusion.  All other systems reviewed and are negative.    OBJECTIVE:    Physical Exam Vitals reviewed.  Constitutional:      Appearance: Normal appearance.  HENT:     Mouth/Throat:     Mouth: Mucous membranes are moist.  Eyes:     Pupils: Pupils are equal, round, and reactive to light.  Neck:     Vascular: No carotid bruit.  Cardiovascular:      Rate and Rhythm: Normal rate and regular rhythm.     Pulses: Normal pulses.     Heart sounds: Normal heart sounds.  Pulmonary:     Effort: Pulmonary effort is normal.     Breath sounds: Normal breath sounds. No wheezing or rales.  Abdominal:     General: Bowel sounds are normal.     Palpations: Abdomen is soft. There is no hepatomegaly, splenomegaly or mass.     Tenderness: There is no abdominal tenderness.     Hernia: No hernia is present.  Musculoskeletal:        General: Tenderness (mild crepitus) present. Normal range of motion.     Cervical back: Neck supple.     Right lower leg: No edema.     Left lower leg: No edema.  Skin:    Findings: No rash.  Neurological:     Mental Status: He is alert and oriented to person, place, and time.     Motor: No weakness.  Psychiatric:        Mood and Affect: Mood normal.        Behavior: Behavior normal.     BP (!) 146/85   Pulse 94   Ht 5' 9"  (1.753 m)   Wt 198 lb 1.6 oz (89.9 kg)   BMI 29.25 kg/m  Wt Readings from Last 3 Encounters:  05/09/20 198 lb 1.6 oz (89.9 kg)  02/07/20 201 lb (91.2 kg)    Health Maintenance Due  Topic Date Due  . Hepatitis C Screening  Never done  . COVID-19 Vaccine (1) Never done  . TETANUS/TDAP  Never done  . COLONOSCOPY  Never done  . PNA vac Low Risk Adult (1 of 2 - PCV13) Never done  . INFLUENZA VACCINE  04/15/2020    There are no preventive care reminders to display for this patient.  No flowsheet data found. No flowsheet data found.  No results found for: TSH No results found for: ALBUMIN, ANIONGAP, EGFR, GFR No results found for: CHOL, HDL, LDLCALC, CHOLHDL No results found for: TRIG No results found for: HGBA1C    ASSESSMENT & PLAN:   Problem List Items Addressed This Visit      Cardiovascular and Mediastinum   Essential hypertension - Primary    - Today, the patient's blood pressure is well managed on lisinopril. - The patient will continue the current treatment regimen.  -  I encouraged the patient to eat a low-sodium diet to help control blood pressure. - I encouraged the patient to live an active lifestyle and complete activities that increases heart rate to 85% target heart rate at least 5 times per week for one  hour.            Endocrine   Hypothyroidism due to Hashimoto's thyroiditis    Pt is taking synthroid 50 mcg PO daily.         Musculoskeletal and Integument   Primary osteoarthritis of right knee    Pt was advised to take Advil, 1-2 tablets BID. If pain does not improve, we will send him to orthopedic specialist.         Other   Dyslipidemia    - The patient's hyperlipidemia is stable on simvastatin. - The patient will continue the current treatment regimen.  - I encouraged the patient to eat more vegetables and whole wheat, and to avoid fatty foods like whole milk, hard cheese, egg yolks, margarine, baked sweets, and fried foods.  - I encouraged the patient to live an active lifestyle and complete activities for 40 minutes at least three times per week.  - I instructed the patient to go to the ER if they begin having chest pain.          No orders of the defined types were placed in this encounter.     Follow-up: No follow-ups on file.    Dr. Jane Canary [facility] [address], [place], [place] [address]   By signing my name below, I, [employer], attest that this documentation has been prepared under the direction of Cletis Athens, MD. Electronically Signed: Cletis Athens, MD 06/12/20, 11:59 AM   I personally performed the services described in this documentation, which was SCRIBED in my presence. The recorded information has been reviewed and considered accurate. It has been edited as necessary during review. Cletis Athens, MD

## 2020-06-12 ENCOUNTER — Encounter: Payer: Self-pay | Admitting: Internal Medicine

## 2020-06-12 DIAGNOSIS — M1711 Unilateral primary osteoarthritis, right knee: Secondary | ICD-10-CM | POA: Insufficient documentation

## 2020-06-12 NOTE — Assessment & Plan Note (Signed)
-   The patient's hyperlipidemia is stable on simvastatin. - The patient will continue the current treatment regimen.  - I encouraged the patient to eat more vegetables and whole wheat, and to avoid fatty foods like whole milk, hard cheese, egg yolks, margarine, baked sweets, and fried foods.  - I encouraged the patient to live an active lifestyle and complete activities for 40 minutes at least three times per week.  - I instructed the patient to go to the ER if they begin having chest pain.   

## 2020-06-12 NOTE — Assessment & Plan Note (Signed)
-   Today, the patient's blood pressure is well managed on lisinopril. - The patient will continue the current treatment regimen.  - I encouraged the patient to eat a low-sodium diet to help control blood pressure. - I encouraged the patient to live an active lifestyle and complete activities that increases heart rate to 85% target heart rate at least 5 times per week for one hour.     

## 2020-06-12 NOTE — Assessment & Plan Note (Signed)
Pt was advised to take Advil, 1-2 tablets BID. If pain does not improve, we will send him to orthopedic specialist.

## 2020-06-12 NOTE — Assessment & Plan Note (Signed)
Pt is taking synthroid 50 mcg PO daily.

## 2020-06-13 DIAGNOSIS — Z23 Encounter for immunization: Secondary | ICD-10-CM | POA: Diagnosis not present

## 2020-07-11 ENCOUNTER — Other Ambulatory Visit: Payer: Self-pay | Admitting: Internal Medicine

## 2020-08-02 ENCOUNTER — Encounter: Payer: Self-pay | Admitting: Family Medicine

## 2020-08-02 ENCOUNTER — Ambulatory Visit (INDEPENDENT_AMBULATORY_CARE_PROVIDER_SITE_OTHER): Payer: Medicare Other | Admitting: Family Medicine

## 2020-08-02 ENCOUNTER — Other Ambulatory Visit: Payer: Self-pay

## 2020-08-02 VITALS — BP 130/87 | HR 84

## 2020-08-02 DIAGNOSIS — Z23 Encounter for immunization: Secondary | ICD-10-CM | POA: Diagnosis not present

## 2020-08-02 DIAGNOSIS — Z0000 Encounter for general adult medical examination without abnormal findings: Secondary | ICD-10-CM | POA: Diagnosis not present

## 2020-08-02 DIAGNOSIS — I10 Essential (primary) hypertension: Secondary | ICD-10-CM | POA: Diagnosis not present

## 2020-08-02 DIAGNOSIS — E785 Hyperlipidemia, unspecified: Secondary | ICD-10-CM | POA: Diagnosis not present

## 2020-08-02 DIAGNOSIS — S60511A Abrasion of right hand, initial encounter: Secondary | ICD-10-CM | POA: Diagnosis not present

## 2020-08-02 NOTE — Progress Notes (Signed)
Established Patient Office Visit  SUBJECTIVE:  Subjective  Patient ID: Adam Castillo, male    DOB: March 04, 1946  Age: 74 y.o. MRN: [id number]  CC:  Chief Complaint  Patient presents with  . Annual Exam    HPI Adam Castillo is a 74 y.o. male presenting today for     History reviewed. No pertinent past medical history.  History reviewed. No pertinent surgical history.  History reviewed. No pertinent family history.  Social History   Socioeconomic History  . Marital status: Single    Spouse name: Not on file  . Number of children: Not on file  . Years of education: Not on file  . Highest education level: Not on file  Occupational History  . Not on file  Tobacco Use  . Smoking status: Never Smoker  . Smokeless tobacco: Never Used  Substance and Sexual Activity  . Alcohol use: Not on file  . Drug use: Not on file  . Sexual activity: Not on file  Other Topics Concern  . Not on file  Social History Narrative  . Not on file   Social Determinants of Health   Financial Resource Strain:   . Difficulty of Paying Living Expenses: Not on file  Food Insecurity:   . Worried About Charity fundraiser in the Last Year: Not on file  . Ran Out of Food in the Last Year: Not on file  Transportation Needs:   . Lack of Transportation (Medical): Not on file  . Lack of Transportation (Non-Medical): Not on file  Physical Activity:   . Days of Exercise per Week: Not on file  . Minutes of Exercise per Session: Not on file  Stress:   . Feeling of Stress : Not on file  Social Connections:   . Frequency of Communication with Friends and Family: Not on file  . Frequency of Social Gatherings with Friends and Family: Not on file  . Attends Religious Services: Not on file  . Active Member of Clubs or Organizations: Not on file  . Attends Archivist Meetings: Not on file  . Marital Status: Not on file  Intimate Partner Violence:   . Fear of Current or Ex-Partner: Not on file  .  Emotionally Abused: Not on file  . Physically Abused: Not on file  . Sexually Abused: Not on file     Current Outpatient Medications:  .  Alpha-Lipoic Acid 100 MG CAPS, Take 1 capsule (100 mg total) by mouth daily., Disp: 90 capsule, Rfl: 3 .  aspirin EC 81 MG tablet, Take 81 mg by mouth daily., Disp: , Rfl:  .  levothyroxine (SYNTHROID) 50 MCG tablet, TAKE 1 TABLET BY MOUTH EVERY DAY, Disp: 90 tablet, Rfl: 2 .  lisinopril (PRINIVIL,ZESTRIL) 40 MG tablet, Take 40 mg by mouth daily., Disp: , Rfl: 3 .  Multiple Vitamins-Minerals (CENTRUM SILVER 50+MEN PO), Take 1 tablet by mouth daily., Disp: , Rfl:  .  simvastatin (ZOCOR) 20 MG tablet, Take 20 mg by mouth daily., Disp: , Rfl: 3   No Known Allergies  ROS Review of Systems  Constitutional: Negative.   HENT: Negative.   Eyes: Negative.   Respiratory: Negative.   Cardiovascular: Negative.   Gastrointestinal: Negative.   Musculoskeletal: Positive for joint swelling.  Skin: Negative.   Neurological: Negative.   All other systems reviewed and are negative.    OBJECTIVE:    Physical Exam Vitals and nursing note reviewed.  Constitutional:      Appearance: He is  normal weight.  HENT:     Head: Normocephalic.     Mouth/Throat:     Mouth: Mucous membranes are moist.  Eyes:     Pupils: Pupils are equal, round, and reactive to light.  Cardiovascular:     Rate and Rhythm: Normal rate.     Heart sounds: Murmur heard.      Comments: 1/6 murmur had since he was an infant Pulmonary:     Effort: Pulmonary effort is normal.  Musculoskeletal:        General: Normal range of motion.  Skin:    Capillary Refill: Capillary refill takes less than 2 seconds.  Neurological:     Mental Status: He is alert.  Psychiatric:        Mood and Affect: Mood normal.     BP 130/87   Pulse 84  Wt Readings from Last 3 Encounters:  05/09/20 198 lb 1.6 oz (89.9 kg)  02/07/20 201 lb (91.2 kg)    Health Maintenance Due  Topic Date Due  .  Hepatitis C Screening  Never done  . COVID-19 Vaccine (1) Never done  . TETANUS/TDAP  Never done  . COLONOSCOPY  Never done  . PNA vac Low Risk Adult (1 of 2 - PCV13) Never done    There are no preventive care reminders to display for this patient.  No flowsheet data found. No flowsheet data found.  No results found for: TSH No results found for: ALBUMIN, ANIONGAP, EGFR, GFR No results found for: CHOL, HDL, LDLCALC, CHOLHDL No results found for: TRIG No results found for: HGBA1C    ASSESSMENT & PLAN:   Problem List Items Addressed This Visit      Cardiovascular and Mediastinum   Essential hypertension    Patient's blood pressure is within the desired range. Medication side effects include: no side effects noted Continue current treatment regimen.         Musculoskeletal and Integument   Abrasion of right hand    Self limiting abrasion, will update TDAP today.         Other   Dyslipidemia    On all meds as rx, diet is not doing well per pt, will draw fasting labs tomorrow.       Annual physical exam - Primary    Colonoscopy- 2014 He believes Eye Exam- Unknown Tdap- Will give today PSA- 1 year ago.  Has not had labs drawn yet and is not fasting, will come back tomorrow fasting.          No orders of the defined types were placed in this encounter.     Follow-up: No follow-ups on file.    Beckie Salts, [place] [address], [place], [place] [address]

## 2020-08-02 NOTE — Assessment & Plan Note (Addendum)
Colonoscopy- 2014 He believes Eye Exam- Unknown Tdap- Will give today PSA- 1 year ago.  Has not had labs drawn yet and is not fasting, will come back tomorrow fasting. PSA- due

## 2020-08-02 NOTE — Assessment & Plan Note (Signed)
Self limiting abrasion, will update TDAP today.

## 2020-08-02 NOTE — Assessment & Plan Note (Signed)
On all meds as rx, diet is not doing well per pt, will draw fasting labs tomorrow.

## 2020-08-02 NOTE — Assessment & Plan Note (Signed)
Patient's blood pressure is within the desired range. Medication side effects include: no side effects noted Continue current treatment regimen.  

## 2020-08-03 ENCOUNTER — Ambulatory Visit (INDEPENDENT_AMBULATORY_CARE_PROVIDER_SITE_OTHER): Payer: Medicare Other | Admitting: Family Medicine

## 2020-08-03 DIAGNOSIS — I10 Essential (primary) hypertension: Secondary | ICD-10-CM

## 2020-08-03 DIAGNOSIS — Z0000 Encounter for general adult medical examination without abnormal findings: Secondary | ICD-10-CM | POA: Diagnosis not present

## 2020-08-04 LAB — COMPLETE METABOLIC PANEL WITH GFR
AG Ratio: 1.6 (calc) (ref 1.0–2.5)
ALT: 14 U/L (ref 9–46)
AST: 15 U/L (ref 10–35)
Albumin: 4.4 g/dL (ref 3.6–5.1)
Alkaline phosphatase (APISO): 61 U/L (ref 35–144)
BUN/Creatinine Ratio: 11 (calc) (ref 6–22)
BUN: 17 mg/dL (ref 7–25)
CO2: 19 mmol/L — ABNORMAL LOW (ref 20–32)
Calcium: 10.4 mg/dL — ABNORMAL HIGH (ref 8.6–10.3)
Chloride: 112 mmol/L — ABNORMAL HIGH (ref 98–110)
Creat: 1.49 mg/dL — ABNORMAL HIGH (ref 0.70–1.18)
GFR, Est African American: 53 mL/min/{1.73_m2} — ABNORMAL LOW (ref >=60)
GFR, Est Non African American: 46 mL/min/{1.73_m2} — ABNORMAL LOW (ref >=60)
Globulin: 2.8 g/dL (calc) (ref 1.9–3.7)
Glucose, Bld: 146 mg/dL — ABNORMAL HIGH (ref 65–99)
Potassium: 4.8 mmol/L (ref 3.5–5.3)
Sodium: 151 mmol/L — ABNORMAL HIGH (ref 135–146)
Total Bilirubin: 0.5 mg/dL (ref 0.2–1.2)
Total Protein: 7.2 g/dL (ref 6.1–8.1)

## 2020-08-04 LAB — CBC WITH DIFFERENTIAL/PLATELET
Absolute Monocytes: 380 cells/uL (ref 200–950)
Basophils Absolute: 90 cells/uL (ref 0–200)
Basophils Relative: 1.8 %
Eosinophils Absolute: 410 cells/uL (ref 15–500)
Eosinophils Relative: 8.2 %
HCT: 47.2 % (ref 38.5–50.0)
Hemoglobin: 16 g/dL (ref 13.2–17.1)
Lymphs Abs: 2030 cells/uL (ref 850–3900)
MCH: 30.6 pg (ref 27.0–33.0)
MCHC: 33.9 g/dL (ref 32.0–36.0)
MCV: 90.2 fL (ref 80.0–100.0)
MPV: 11.1 fL (ref 7.5–12.5)
Monocytes Relative: 7.6 %
Neutro Abs: 2090 cells/uL (ref 1500–7800)
Neutrophils Relative %: 41.8 %
Platelets: 223 10*3/uL (ref 140–400)
RBC: 5.23 10*6/uL (ref 4.20–5.80)
RDW: 12.2 % (ref 11.0–15.0)
Total Lymphocyte: 40.6 %
WBC: 5 10*3/uL (ref 3.8–10.8)

## 2020-08-04 LAB — LIPID PANEL
Cholesterol: 193 mg/dL (ref <200)
HDL: 43 mg/dL (ref >=40)
LDL Cholesterol (Calc): 125 mg/dL (calc) — ABNORMAL HIGH
Non-HDL Cholesterol (Calc): 150 mg/dL (calc) — ABNORMAL HIGH (ref <130)
Total CHOL/HDL Ratio: 4.5 (calc) (ref <5.0)
Triglycerides: 140 mg/dL (ref <150)

## 2020-08-04 LAB — TSH: TSH: 2.61 mIU/L (ref 0.40–4.50)

## 2020-08-04 LAB — PSA: PSA: 1.74 ng/mL (ref <=4.0)

## 2020-08-14 ENCOUNTER — Other Ambulatory Visit: Payer: Self-pay

## 2020-08-14 ENCOUNTER — Ambulatory Visit (INDEPENDENT_AMBULATORY_CARE_PROVIDER_SITE_OTHER): Payer: Medicare Other | Admitting: Family Medicine

## 2020-08-14 ENCOUNTER — Encounter: Payer: Self-pay | Admitting: Family Medicine

## 2020-08-14 VITALS — BP 137/87 | HR 82 | Ht 69.0 in | Wt 198.0 lb

## 2020-08-14 DIAGNOSIS — E785 Hyperlipidemia, unspecified: Secondary | ICD-10-CM

## 2020-08-14 DIAGNOSIS — N1831 Chronic kidney disease, stage 3a: Secondary | ICD-10-CM | POA: Diagnosis not present

## 2020-08-14 NOTE — Assessment & Plan Note (Signed)
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 Plan- Discussed Tylenol for pain and to discuss any OTC meds with provider before starting.

## 2020-08-14 NOTE — Progress Notes (Signed)
Established Patient Office Visit  SUBJECTIVE:  Subjective  Patient ID: Adam Castillo, male    DOB: 07-18-1946  Age: 74 y.o. MRN: [id number]  CC:  Chief Complaint  Patient presents with   lab results    HPI Adam Castillo is a 74 y.o. male presenting today for     History reviewed. No pertinent past medical history.  History reviewed. No pertinent surgical history.  History reviewed. No pertinent family history.  Social History   Socioeconomic History   Marital status: Single    Spouse name: Not on file   Number of children: Not on file   Years of education: Not on file   Highest education level: Not on file  Occupational History   Not on file  Tobacco Use   Smoking status: Never Smoker   Smokeless tobacco: Never Used  Substance and Sexual Activity   Alcohol use: Not on file   Drug use: Not on file   Sexual activity: Not on file  Other Topics Concern   Not on file  Social History Narrative   Not on file   Social Determinants of Health   Financial Resource Strain:    Difficulty of Paying Living Expenses: Not on file  Food Insecurity:    Worried About [place] in the Last Year: Not on file   Ran Out of Food in the Last Year: Not on file  Transportation Needs:    Lack of Transportation (Medical): Not on file   Lack of Transportation (Non-Medical): Not on file  Physical Activity:    Days of Exercise per Week: Not on file   Minutes of Exercise per Session: Not on file  Stress:    Feeling of Stress : Not on file  Social Connections:    Frequency of Communication with Friends and Family: Not on file   Frequency of Social Gatherings with Friends and Family: Not on file   Attends Religious Services: Not on file   Active Member of Clubs or Organizations: Not on file   Attends Archivist Meetings: Not on file   Marital Status: Not on file  Intimate Partner Violence:    Fear of Current or Ex-Partner: Not on file    Emotionally Abused: Not on file   Physically Abused: Not on file   Sexually Abused: Not on file     Current Outpatient Medications:    Alpha-Lipoic Acid 100 MG CAPS, Take 1 capsule (100 mg total) by mouth daily., Disp: 90 capsule, Rfl: 3   aspirin EC 81 MG tablet, Take 81 mg by mouth daily., Disp: , Rfl:    levothyroxine (SYNTHROID) 50 MCG tablet, TAKE 1 TABLET BY MOUTH EVERY DAY, Disp: 90 tablet, Rfl: 2   lisinopril (PRINIVIL,ZESTRIL) 40 MG tablet, Take 40 mg by mouth daily., Disp: , Rfl: 3   Multiple Vitamins-Minerals (CENTRUM SILVER 50+MEN PO), Take 1 tablet by mouth daily., Disp: , Rfl:    simvastatin (ZOCOR) 20 MG tablet, Take 20 mg by mouth daily., Disp: , Rfl: 3   No Known Allergies  ROS Review of Systems  Constitutional: Negative.   HENT: Negative.   Respiratory: Negative.   Cardiovascular: Negative.   Musculoskeletal: Negative.   Psychiatric/Behavioral: Negative.      OBJECTIVE:    Physical Exam Constitutional:      Appearance: Normal appearance.  HENT:     Mouth/Throat:     Mouth: Mucous membranes are moist.  Eyes:     Pupils: Pupils are equal, round,  and reactive to light.  Pulmonary:     Effort: Pulmonary effort is normal.  Musculoskeletal:        General: Normal range of motion.  Skin:    General: Skin is warm.  Neurological:     Mental Status: He is alert.     BP 137/87    Pulse 82    Ht _0  (1.753 m)    Wt 198 lb (89.8 kg)    BMI 29.24 kg/m  Wt Readings from Last 3 Encounters:  08/14/20 198 lb (89.8 kg)  05/09/20 198 lb 1.6 oz (89.9 kg)  02/07/20 201 lb (91.2 kg)    Health Maintenance Due  Topic Date Due   Hepatitis C Screening  Never done   COVID-19 Vaccine (1) Never done   COLONOSCOPY  Never done   PNA vac Low Risk Adult (1 of 2 - PCV13) Never done    There are no preventive care reminders to display for this patient.  CBC Latest Ref Rng & Units 08/03/2020  WBC 3.8 - 10.8 Thousand/uL 5.0  Hemoglobin 13.2 - 17.1 g/dL  16.0  Hematocrit 38 - 50 % 47.2  Platelets 140 - 400 Thousand/uL 223   CMP Latest Ref Rng & Units 08/03/2020  Glucose 65 - 99 mg/dL 146(H)  BUN 7 - 25 mg/dL 17  Creatinine 0.70 - 1.18 mg/dL 1.49(H)  Sodium 135 - 146 mmol/L 151(H)  Potassium 3.5 - 5.3 mmol/L 4.8  Chloride 98 - 110 mmol/L 112(H)  CO2 20 - 32 mmol/L 19(L)  Calcium 8.6 - 10.3 mg/dL 10.4(H)  Total Protein 6.1 - 8.1 g/dL 7.2  Total Bilirubin 0.2 - 1.2 mg/dL 0.5  AST 10 - 35 U/L 15  ALT 9 - 46 U/L 14    Lab Results  Component Value Date   TSH 2.61 08/03/2020   No results found for: ALBUMIN, ANIONGAP, EGFR, GFR Lab Results  Component Value Date   CHOL 193 08/03/2020   HDL 43 08/03/2020   LDLCALC 125 (H) 08/03/2020   CHOLHDL 4.5 08/03/2020   Lab Results  Component Value Date   TRIG 140 08/03/2020   No results found for: HGBA1C    ASSESSMENT & PLAN:   Problem List Items Addressed This Visit      Genitourinary   Stage 3a chronic kidney disease ([place]) - Primary    Newly Diagnosed CKD with GFR of 46, not on any NSAIDS at this time. Plan- Discussed Tylenol for pain and to discuss any OTC meds with provider before starting.         Other   Dyslipidemia      No orders of the defined types were placed in this encounter.     Follow-up: No follow-ups on file.    Beckie Salts, [place] [address] [place], [place] [address]

## 2020-08-22 ENCOUNTER — Other Ambulatory Visit: Payer: Self-pay | Admitting: *Deleted

## 2020-08-22 MED ORDER — LISINOPRIL 40 MG PO TABS
40.0000 mg | ORAL_TABLET | Freq: Every day | ORAL | 3 refills | Status: DC
Start: 2020-08-22 — End: 2021-06-25

## 2020-09-28 ENCOUNTER — Other Ambulatory Visit: Payer: Self-pay | Admitting: Internal Medicine

## 2020-09-28 DIAGNOSIS — E039 Hypothyroidism, unspecified: Secondary | ICD-10-CM

## 2020-11-02 ENCOUNTER — Encounter: Payer: Self-pay | Admitting: Family Medicine

## 2020-11-02 ENCOUNTER — Other Ambulatory Visit: Payer: Self-pay

## 2020-11-02 ENCOUNTER — Ambulatory Visit (INDEPENDENT_AMBULATORY_CARE_PROVIDER_SITE_OTHER): Payer: Medicare Other | Admitting: Family Medicine

## 2020-11-02 VITALS — BP 152/77 | HR 90 | Ht 69.0 in | Wt 202.7 lb

## 2020-11-02 DIAGNOSIS — K4091 Unilateral inguinal hernia, without obstruction or gangrene, recurrent: Secondary | ICD-10-CM

## 2020-11-02 DIAGNOSIS — K4090 Unilateral inguinal hernia, without obstruction or gangrene, not specified as recurrent: Secondary | ICD-10-CM | POA: Insufficient documentation

## 2020-11-02 DIAGNOSIS — I10 Essential (primary) hypertension: Secondary | ICD-10-CM

## 2020-11-02 NOTE — Progress Notes (Signed)
Established Patient Office Visit  SUBJECTIVE:  Subjective  Patient ID: Adam Castillo, male    DOB: [birth year]  Age: 75 y.o. MRN: [id number]  CC:  Chief Complaint  Patient presents with  . Hypertension    HPI Adam Castillo is a 75 y.o. male presenting today for     History reviewed. No pertinent past medical history.  History reviewed. No pertinent surgical history.  History reviewed. No pertinent family history.  Social History   Socioeconomic History  . Marital status: Single    Spouse name: Not on file  . Number of children: Not on file  . Years of education: Not on file  . Highest education level: Not on file  Occupational History  . Not on file  Tobacco Use  . Smoking status: Never Smoker  . Smokeless tobacco: Never Used  Substance and Sexual Activity  . Alcohol use: Not on file  . Drug use: Not on file  . Sexual activity: Not on file  Other Topics Concern  . Not on file  Social History Narrative  . Not on file   Social Determinants of Health   Financial Resource Strain: Not on file  Food Insecurity: Not on file  Transportation Needs: Not on file  Physical Activity: Not on file  Stress: Not on file  Social Connections: Not on file  Intimate Partner Violence: Not on file     Current Outpatient Medications:  .  Alpha-Lipoic Acid 100 MG CAPS, Take 1 capsule (100 mg total) by mouth daily., Disp: 90 capsule, Rfl: 3 .  aspirin EC 81 MG tablet, Take 81 mg by mouth daily., Disp: , Rfl:  .  levothyroxine (SYNTHROID) 50 MCG tablet, TAKE 1 TABLET BY MOUTH EVERY DAY, Disp: 90 tablet, Rfl: 2 .  lisinopril (ZESTRIL) 40 MG tablet, Take 1 tablet (40 mg total) by mouth daily., Disp: 90 tablet, Rfl: 3 .  Multiple Vitamins-Minerals (CENTRUM SILVER 50+MEN PO), Take 1 tablet by mouth daily., Disp: , Rfl:  .  simvastatin (ZOCOR) 20 MG tablet, TAKE 1 TABLET BY MOUTH EVERY DAY, Disp: 90 tablet, Rfl: 3   No Known Allergies  ROS Review of Systems  HENT: Negative.    Respiratory: Negative.   Gastrointestinal: Negative.   Genitourinary: Positive for scrotal swelling.  Musculoskeletal: Negative.   Neurological: Negative.   Psychiatric/Behavioral: Negative.      OBJECTIVE:    Physical Exam Vitals and nursing note reviewed.  Constitutional:      Appearance: Normal appearance.  HENT:     Right Ear: Tympanic membrane normal.     Left Ear: Tympanic membrane normal.  Eyes:     Pupils: Pupils are equal, round, and reactive to light.  Cardiovascular:     Rate and Rhythm: Normal rate and regular rhythm.  Pulmonary:     Effort: Pulmonary effort is normal.  Abdominal:     General: Abdomen is flat.  Genitourinary:    Penis: Normal.      Testes: Normal.     Comments: Small inguinal hernia on R side.  Musculoskeletal:     Cervical back: Normal range of motion.  Skin:    Capillary Refill: Capillary refill takes less than 2 seconds.  Neurological:     Mental Status: He is alert.  Psychiatric:        Mood and Affect: Mood normal.     BP (!) 152/77   Pulse 90   Ht 5' 9" (1.753 m)   Wt 202 lb 11.2 oz (91.9 kg)  BMI 29.93 kg/m  Wt Readings from Last 3 Encounters:  11/02/20 202 lb 11.2 oz (91.9 kg)  08/14/20 198 lb (89.8 kg)  05/09/20 198 lb 1.6 oz (89.9 kg)    Health Maintenance Due  Topic Date Due  . Hepatitis C Screening  Never done  . COVID-19 Vaccine (1) Never done  . COLONOSCOPY (Pts 45-52yr Insurance coverage will need to be confirmed)  Never done  . PNA vac Low Risk Adult (1 of 2 - PCV13) Never done    There are no preventive care reminders to display for this patient.  CBC Latest Ref Rng & Units 08/03/2020  WBC 3.8 - 10.8 Thousand/uL 5.0  Hemoglobin 13.2 - 17.1 g/dL 16.0  Hematocrit 38.5 - 50.0 % 47.2  Platelets 140 - 400 Thousand/uL 223   CMP Latest Ref Rng & Units 08/03/2020  Glucose 65 - 99 mg/dL 146(H)  BUN 7 - 25 mg/dL 17  Creatinine 0.70 - 1.18 mg/dL 1.49(H)  Sodium 135 - 146 mmol/L 151(H)  Potassium 3.5 - 5.3  mmol/L 4.8  Chloride 98 - 110 mmol/L 112(H)  CO2 20 - 32 mmol/L 19(L)  Calcium 8.6 - 10.3 mg/dL 10.4(H)  Total Protein 6.1 - 8.1 g/dL 7.2  Total Bilirubin 0.2 - 1.2 mg/dL 0.5  AST 10 - 35 U/L 15  ALT 9 - 46 U/L 14    Lab Results  Component Value Date   TSH 2.61 08/03/2020   No results found for: ALBUMIN, ANIONGAP, EGFR, GFR Lab Results  Component Value Date   CHOL 193 08/03/2020   HDL 43 08/03/2020   LDLCALC 125 (H) 08/03/2020   CHOLHDL 4.5 08/03/2020   Lab Results  Component Value Date   TRIG 140 08/03/2020   No results found for: HGBA1C    ASSESSMENT & PLAN:   Problem List Items Addressed This Visit      Cardiovascular and Mediastinum   Essential hypertension - Primary    Patient with BP not quite at goal but it has been wnl the last few visits. Plan- Continue same meds for now f/u 3 months.           Other   Unilateral recurrent inguinal hernia without obstruction or gangrene    R side groin swelling in inguinal region. Pain subsides with rest, no redness or tenderness at the area. Plan- Monitor for now, patient educated on when to seek emergency care, will ref to surgery if patient condition worsens or pain worsens.          No orders of the defined types were placed in this encounter.     Follow-up: No follow-ups on file.    Adam Castillo [place] [place] [id number]

## 2020-11-02 NOTE — Assessment & Plan Note (Signed)
Patient with BP not quite at goal but it has been wnl the last few visits. Plan- Continue same meds for now f/u 3 months.

## 2020-11-02 NOTE — Assessment & Plan Note (Signed)
R side groin swelling in inguinal region. Pain subsides with rest, no redness or tenderness at the area. Plan- Monitor for now, patient educated on when to seek emergency care, will ref to surgery if patient condition worsens or pain worsens.

## 2020-12-13 DIAGNOSIS — H25813 Combined forms of age-related cataract, bilateral: Secondary | ICD-10-CM | POA: Diagnosis not present

## 2021-01-30 ENCOUNTER — Encounter: Payer: Self-pay | Admitting: Internal Medicine

## 2021-01-30 ENCOUNTER — Ambulatory Visit (INDEPENDENT_AMBULATORY_CARE_PROVIDER_SITE_OTHER): Payer: Medicare Other | Admitting: Internal Medicine

## 2021-01-30 ENCOUNTER — Other Ambulatory Visit: Payer: Self-pay

## 2021-01-30 VITALS — BP 125/80 | HR 81 | Ht 69.0 in | Wt 205.6 lb

## 2021-01-30 DIAGNOSIS — M1711 Unilateral primary osteoarthritis, right knee: Secondary | ICD-10-CM

## 2021-01-30 DIAGNOSIS — E063 Autoimmune thyroiditis: Secondary | ICD-10-CM | POA: Diagnosis not present

## 2021-01-30 DIAGNOSIS — E785 Hyperlipidemia, unspecified: Secondary | ICD-10-CM

## 2021-01-30 DIAGNOSIS — E038 Other specified hypothyroidism: Secondary | ICD-10-CM

## 2021-01-30 DIAGNOSIS — I10 Essential (primary) hypertension: Secondary | ICD-10-CM | POA: Diagnosis not present

## 2021-01-30 NOTE — Assessment & Plan Note (Signed)
Osteoarthritis of the knee is stable at the present time.

## 2021-01-30 NOTE — Assessment & Plan Note (Signed)
Stable at the present time.  We will check a TSH level.

## 2021-01-30 NOTE — Assessment & Plan Note (Signed)
Hypercholesterolemia  I advised the patient to follow Mediterranean diet This diet is rich in fruits vegetables and whole grain, and This diet is also rich in fish and lean meat Patient should also eat a handful of almonds or walnuts daily Recent heart study indicated that average follow-up on this kind of diet reduces the cardiovascular mortality by 50 to 70%== 

## 2021-01-30 NOTE — Assessment & Plan Note (Signed)
Patient blood pressure is normal patient denies any chest pain or shortness of breath there is no history of palpitation or paroxysmal nocturnal dyspnea   patient was advised to follow low-salt low-cholesterol diet    ideally I want to keep systolic blood pressure below 130 mmHg, patient was asked to check blood pressure one times a week and give me a report on that.  Patient will be follow-up in 3 months  or earlier as needed, patient will call me back for any change in the cardiovascular symptoms    

## 2021-01-30 NOTE — Progress Notes (Signed)
Established Patient Office Visit  Subjective:  Patient ID: Adam Castillo, male    DOB: 11/29/1945  Age: 75 y.o. MRN: [id number]  CC:  Chief Complaint  Patient presents with  . Hypertension    HPI  Draydon Clair[place] presents for regular checkup.  He denies any chest pain or shortness of breath.  He does not smoke does not drink.  He says he has been taking his medicine regularly.  He is working full-time.  His osteoarthritis of the knee is better.  I will perform an electrolyte on him and hemoglobin AIC is a blood sugar was elevated in the past.  We will also check his TSH  History reviewed. No pertinent past medical history.  History reviewed. No pertinent surgical history.  History reviewed. No pertinent family history.  Social History   Socioeconomic History  . Marital status: Single    Spouse name: Not on file  . Number of children: Not on file  . Years of education: Not on file  . Highest education level: Not on file  Occupational History  . Not on file  Tobacco Use  . Smoking status: Never Smoker  . Smokeless tobacco: Never Used  Substance and Sexual Activity  . Alcohol use: Not on file  . Drug use: Not on file  . Sexual activity: Not on file  Other Topics Concern  . Not on file  Social History Narrative  . Not on file   Social Determinants of Health   Financial Resource Strain: Not on file  Food Insecurity: Not on file  Transportation Needs: Not on file  Physical Activity: Not on file  Stress: Not on file  Social Connections: Not on file  Intimate Partner Violence: Not on file     Current Outpatient Medications:  .  Alpha-Lipoic Acid 100 MG CAPS, Take 1 capsule (100 mg total) by mouth daily., Disp: 90 capsule, Rfl: 3 .  aspirin EC 81 MG tablet, Take 81 mg by mouth daily., Disp: , Rfl:  .  levothyroxine (SYNTHROID) 50 MCG tablet, TAKE 1 TABLET BY MOUTH EVERY DAY, Disp: 90 tablet, Rfl: 2 .  lisinopril (ZESTRIL) 40 MG tablet, Take 1 tablet (40 mg total) by  mouth daily., Disp: 90 tablet, Rfl: 3 .  Multiple Vitamins-Minerals (CENTRUM SILVER 50+MEN PO), Take 1 tablet by mouth daily., Disp: , Rfl:  .  simvastatin (ZOCOR) 20 MG tablet, TAKE 1 TABLET BY MOUTH EVERY DAY, Disp: 90 tablet, Rfl: 3   No Known Allergies  ROS Review of Systems  Constitutional: Negative.   HENT: Negative.   Eyes: Negative.   Respiratory: Negative.   Cardiovascular: Negative.   Gastrointestinal: Negative.   Endocrine: Negative.   Genitourinary: Negative.   Musculoskeletal: Negative.   Skin: Negative.   Allergic/Immunologic: Negative.   Neurological: Negative.   Hematological: Negative.   Psychiatric/Behavioral: Negative.   All other systems reviewed and are negative.     Objective:    Physical Exam Vitals reviewed.  Constitutional:      Appearance: Normal appearance.  HENT:     Mouth/Throat:     Mouth: Mucous membranes are moist.  Eyes:     Pupils: Pupils are equal, round, and reactive to light.  Neck:     Vascular: No carotid bruit.  Cardiovascular:     Rate and Rhythm: Normal rate and regular rhythm.     Pulses: Normal pulses.     Heart sounds: Normal heart sounds.  Pulmonary:     Effort: Pulmonary effort is normal.  Breath sounds: Normal breath sounds.  Abdominal:     General: Bowel sounds are normal.     Palpations: Abdomen is soft. There is no hepatomegaly, splenomegaly or mass.     Tenderness: There is no abdominal tenderness.     Hernia: No hernia is present.  Musculoskeletal:     Cervical back: Neck supple.     Right lower leg: No edema.     Left lower leg: No edema.  Skin:    Findings: No rash.  Neurological:     Mental Status: He is alert and oriented to person, place, and time.     Motor: No weakness.  Psychiatric:        Mood and Affect: Mood normal.        Behavior: Behavior normal.     BP 125/80   Pulse 81   Ht 5\' 9"  (1.753 m)   Wt 205 lb 9.6 oz (93.3 kg)   BMI 30.36 kg/m  Wt Readings from Last 3 Encounters:   01/30/21 205 lb 9.6 oz (93.3 kg)  11/02/20 202 lb 11.2 oz (91.9 kg)  08/14/20 198 lb (89.8 kg)     Health Maintenance Due  Topic Date Due  . COVID-19 Vaccine (1) Never done  . Hepatitis C Screening  Never done  . COLONOSCOPY (Pts 45-29yrs Insurance coverage will need to be confirmed)  Never done  . PNA vac Low Risk Adult (1 of 2 - PCV13) Never done    There are no preventive care reminders to display for this patient.  Lab Results  Component Value Date   TSH 2.61 08/03/2020   Lab Results  Component Value Date   WBC 5.0 08/03/2020   HGB 16.0 08/03/2020   HCT 47.2 08/03/2020   MCV 90.2 08/03/2020   PLT 223 08/03/2020   Lab Results  Component Value Date   NA 151 (H) 08/03/2020   K 4.8 08/03/2020   CO2 19 (L) 08/03/2020   GLUCOSE 146 (H) 08/03/2020   BUN 17 08/03/2020   CREATININE 1.49 (H) 08/03/2020   BILITOT 0.5 08/03/2020   AST 15 08/03/2020   ALT 14 08/03/2020   PROT 7.2 08/03/2020   CALCIUM 10.4 (H) 08/03/2020   Lab Results  Component Value Date   CHOL 193 08/03/2020   Lab Results  Component Value Date   HDL 43 08/03/2020   Lab Results  Component Value Date   LDLCALC 125 (H) 08/03/2020   Lab Results  Component Value Date   TRIG 140 08/03/2020   Lab Results  Component Value Date   CHOLHDL 4.5 08/03/2020   No results found for: HGBA1C    Assessment & Plan:   Problem List Items Addressed This Visit      Cardiovascular and Mediastinum   Essential hypertension - Primary    Patient blood pressure is normal patient denies any chest pain or shortness of breath there is no history of palpitation or paroxysmal nocturnal dyspnea   patient was advised to follow low-salt low-cholesterol diet    ideally I want to keep systolic blood pressure below 130 mmHg, patient was asked to check blood pressure one times a week and give me a report on that.  Patient will be follow-up in 3 months  or earlier as needed, patient will call me back for any change in the  cardiovascular symptoms           Endocrine   Hypothyroidism due to Hashimoto's thyroiditis    Stable at the present time.  We will check a TSH level.        Musculoskeletal and Integument   Primary osteoarthritis of right knee    Osteoarthritis of the knee is stable at the present time.        Other   Dyslipidemia    Hypercholesterolemia  I advised the patient to follow Mediterranean diet This diet is rich in fruits vegetables and whole grain, and This diet is also rich in fish and lean meat Patient should also eat a handful of almonds or walnuts daily Recent heart study indicated that average follow-up on this kind of diet reduces the cardiovascular mortality by 50 to 70%==         No orders of the defined types were placed in this encounter.   Follow-up: No follow-ups on file.    Cletis [place], MD

## 2021-01-31 ENCOUNTER — Other Ambulatory Visit (INDEPENDENT_AMBULATORY_CARE_PROVIDER_SITE_OTHER): Payer: Medicare Other

## 2021-01-31 ENCOUNTER — Other Ambulatory Visit: Payer: Self-pay

## 2021-01-31 DIAGNOSIS — E063 Autoimmune thyroiditis: Secondary | ICD-10-CM

## 2021-01-31 DIAGNOSIS — N1831 Chronic kidney disease, stage 3a: Secondary | ICD-10-CM

## 2021-01-31 DIAGNOSIS — E785 Hyperlipidemia, unspecified: Secondary | ICD-10-CM | POA: Diagnosis not present

## 2021-01-31 DIAGNOSIS — I10 Essential (primary) hypertension: Secondary | ICD-10-CM

## 2021-01-31 DIAGNOSIS — E038 Other specified hypothyroidism: Secondary | ICD-10-CM

## 2021-02-01 LAB — TSH: TSH: 2.92 mIU/L (ref 0.40–4.50)

## 2021-02-01 LAB — CBC WITH DIFFERENTIAL/PLATELET
Absolute Monocytes: 401 cells/uL (ref 200–950)
Basophils Absolute: 50 cells/uL (ref 0–200)
Basophils Relative: 1.1 %
Eosinophils Absolute: 369 cells/uL (ref 15–500)
Eosinophils Relative: 8.2 %
HCT: 46.6 % (ref 38.5–50.0)
Hemoglobin: 15.7 g/dL (ref 13.2–17.1)
Lymphs Abs: 1692 cells/uL (ref 850–3900)
MCH: 30.6 pg (ref 27.0–33.0)
MCHC: 33.7 g/dL (ref 32.0–36.0)
MCV: 90.8 fL (ref 80.0–100.0)
MPV: 10.2 fL (ref 7.5–12.5)
Monocytes Relative: 8.9 %
Neutro Abs: 1989 cells/uL (ref 1500–7800)
Neutrophils Relative %: 44.2 %
Platelets: 230 10*3/uL (ref 140–400)
RBC: 5.13 10*6/uL (ref 4.20–5.80)
RDW: 12.9 % (ref 11.0–15.0)
Total Lymphocyte: 37.6 %
WBC: 4.5 10*3/uL (ref 3.8–10.8)

## 2021-02-01 LAB — COMPLETE METABOLIC PANEL WITH GFR
AG Ratio: 1.6 (calc) (ref 1.0–2.5)
ALT: 25 U/L (ref 9–46)
AST: 20 U/L (ref 10–35)
Albumin: 4.1 g/dL (ref 3.6–5.1)
Alkaline phosphatase (APISO): 68 U/L (ref 35–144)
BUN/Creatinine Ratio: 16 (calc) (ref 6–22)
BUN: 21 mg/dL (ref 7–25)
CO2: 24 mmol/L (ref 20–32)
Calcium: 9.6 mg/dL (ref 8.6–10.3)
Chloride: 106 mmol/L (ref 98–110)
Creat: 1.32 mg/dL — ABNORMAL HIGH (ref 0.70–1.18)
GFR, Est African American: 62 mL/min/{1.73_m2} (ref >=60)
GFR, Est Non African American: 53 mL/min/{1.73_m2} — ABNORMAL LOW (ref >=60)
Globulin: 2.5 g/dL (calc) (ref 1.9–3.7)
Glucose, Bld: 157 mg/dL — ABNORMAL HIGH (ref 65–99)
Potassium: 4 mmol/L (ref 3.5–5.3)
Sodium: 140 mmol/L (ref 135–146)
Total Bilirubin: 0.8 mg/dL (ref 0.2–1.2)
Total Protein: 6.6 g/dL (ref 6.1–8.1)

## 2021-02-01 LAB — LIPID PANEL
Cholesterol: 179 mg/dL (ref <200)
HDL: 46 mg/dL (ref >=40)
LDL Cholesterol (Calc): 114 mg/dL (calc) — ABNORMAL HIGH
Non-HDL Cholesterol (Calc): 133 mg/dL (calc) — ABNORMAL HIGH (ref <130)
Total CHOL/HDL Ratio: 3.9 (calc) (ref <5.0)
Triglycerides: 89 mg/dL (ref <150)

## 2021-02-18 ENCOUNTER — Ambulatory Visit (INDEPENDENT_AMBULATORY_CARE_PROVIDER_SITE_OTHER): Payer: Medicare Other | Admitting: Internal Medicine

## 2021-02-18 ENCOUNTER — Encounter: Payer: Self-pay | Admitting: Internal Medicine

## 2021-02-18 ENCOUNTER — Other Ambulatory Visit: Payer: Self-pay

## 2021-02-18 VITALS — BP 145/80 | HR 88 | Ht 69.0 in | Wt 204.4 lb

## 2021-02-18 DIAGNOSIS — E038 Other specified hypothyroidism: Secondary | ICD-10-CM

## 2021-02-18 DIAGNOSIS — E11610 Type 2 diabetes mellitus with diabetic neuropathic arthropathy: Secondary | ICD-10-CM | POA: Diagnosis not present

## 2021-02-18 DIAGNOSIS — E063 Autoimmune thyroiditis: Secondary | ICD-10-CM | POA: Diagnosis not present

## 2021-02-18 DIAGNOSIS — E785 Hyperlipidemia, unspecified: Secondary | ICD-10-CM | POA: Diagnosis not present

## 2021-02-18 DIAGNOSIS — I10 Essential (primary) hypertension: Secondary | ICD-10-CM | POA: Diagnosis not present

## 2021-02-18 DIAGNOSIS — Z794 Long term (current) use of insulin: Secondary | ICD-10-CM

## 2021-02-18 DIAGNOSIS — M1711 Unilateral primary osteoarthritis, right knee: Secondary | ICD-10-CM | POA: Diagnosis not present

## 2021-02-18 MED ORDER — METFORMIN HCL 500 MG PO TABS: 500.0000 mg | ORAL_TABLET | Freq: Two times a day (BID) | ORAL | 3 refills | Status: DC

## 2021-02-18 NOTE — Assessment & Plan Note (Signed)
Patient takes his levothyroxine on a regular basis

## 2021-02-18 NOTE — Assessment & Plan Note (Signed)

## 2021-02-18 NOTE — Progress Notes (Signed)
Established Patient Office Visit  Subjective:  Patient ID: Adam Castillo, male    DOB: 1946-01-15  Age: 75 y.o. MRN: [id number]  CC:  Chief Complaint  Patient presents with  . Lab Results    HPI  Adam Castillo presents for checkup.  Patient blood sugar was found to be elevated, he denies any chest pain or shortness of breath, does not smoke does not drink, there is no history of recent upper respiratory infection, History reviewed. No pertinent past medical history.  History reviewed. No pertinent surgical history.  History reviewed. No pertinent family history.  Social History   Socioeconomic History  . Marital status: Single    Spouse name: Not on file  . Number of children: Not on file  . Years of education: Not on file  . Highest education level: Not on file  Occupational History  . Not on file  Tobacco Use  . Smoking status: Never Smoker  . Smokeless tobacco: Never Used  Substance and Sexual Activity  . Alcohol use: Not on file  . Drug use: Not on file  . Sexual activity: Not on file  Other Topics Concern  . Not on file  Social History Narrative  . Not on file   Social Determinants of Health   Financial Resource Strain: Not on file  Food Insecurity: Not on file  Transportation Needs: Not on file  Physical Activity: Not on file  Stress: Not on file  Social Connections: Not on file  Intimate Partner Violence: Not on file     Current Outpatient Medications:  .  metFORMIN (GLUCOPHAGE) 500 MG tablet, Take 1 tablet (500 mg total) by mouth 2 (two) times daily with a meal., Disp: 180 tablet, Rfl: 3 .  Alpha-Lipoic Acid 100 MG CAPS, Take 1 capsule (100 mg total) by mouth daily., Disp: 90 capsule, Rfl: 3 .  aspirin EC 81 MG tablet, Take 81 mg by mouth daily., Disp: , Rfl:  .  levothyroxine (SYNTHROID) 50 MCG tablet, TAKE 1 TABLET BY MOUTH EVERY DAY, Disp: 90 tablet, Rfl: 2 .  lisinopril (ZESTRIL) 40 MG tablet, Take 1 tablet (40 mg total) by mouth daily., Disp: 90  tablet, Rfl: 3 .  Multiple Vitamins-Minerals (CENTRUM SILVER 50+MEN PO), Take 1 tablet by mouth daily., Disp: , Rfl:  .  simvastatin (ZOCOR) 20 MG tablet, TAKE 1 TABLET BY MOUTH EVERY DAY, Disp: 90 tablet, Rfl: 3   No Known Allergies  ROS Review of Systems  Constitutional: Negative.   HENT: Negative.   Eyes: Negative.   Respiratory: Negative.  Negative for cough and chest tightness.   Cardiovascular: Negative.   Gastrointestinal: Negative.   Endocrine: Negative.   Genitourinary: Negative.  Negative for hematuria.  Musculoskeletal: Negative.   Skin: Negative.   Allergic/Immunologic: Negative.   Neurological: Negative.   Hematological: Negative.   Psychiatric/Behavioral: Negative.  Negative for agitation and behavioral problems.  All other systems reviewed and are negative.     Objective:    Physical Exam Vitals reviewed.  Constitutional:      Appearance: Normal appearance.  HENT:     Mouth/Throat:     Mouth: Mucous membranes are moist.  Eyes:     Pupils: Pupils are equal, round, and reactive to light.  Neck:     Vascular: No carotid bruit.  Cardiovascular:     Rate and Rhythm: Normal rate and regular rhythm.     Pulses: Normal pulses.     Heart sounds: Normal heart sounds.  Pulmonary:  Effort: Pulmonary effort is normal.     Breath sounds: Normal breath sounds.  Abdominal:     General: Bowel sounds are normal.     Palpations: Abdomen is soft. There is no hepatomegaly, splenomegaly or mass.     Tenderness: There is no abdominal tenderness.     Hernia: No hernia is present.  Musculoskeletal:     Cervical back: Neck supple.     Right lower leg: No tenderness. No edema.     Left lower leg: No tenderness. No edema.       Legs:     Comments: Arthritis of both knees.  Skin:    Findings: No rash.  Neurological:     Mental Status: He is alert and oriented to person, place, and time.     Motor: No weakness.  Psychiatric:        Mood and Affect: Mood normal.         Behavior: Behavior normal.     BP (!) 145/80   Pulse 88   Ht 5\' 9"  (1.753 m)   Wt 204 lb 6.4 oz (92.7 kg)   BMI 30.18 kg/m  Wt Readings from Last 3 Encounters:  02/18/21 204 lb 6.4 oz (92.7 kg)  01/30/21 205 lb 9.6 oz (93.3 kg)  11/02/20 202 lb 11.2 oz (91.9 kg)     Health Maintenance Due  Topic Date Due  . COVID-19 Vaccine (1) Never done  . Hepatitis C Screening  Never done  . COLONOSCOPY (Pts 45-69yrs Insurance coverage will need to be confirmed)  Never done  . Zoster Vaccines- Shingrix (1 of 2) Never done  . PNA vac Low Risk Adult (1 of 2 - PCV13) Never done    There are no preventive care reminders to display for this patient.  Lab Results  Component Value Date   TSH 2.92 01/31/2021   Lab Results  Component Value Date   WBC 4.5 01/31/2021   HGB 15.7 01/31/2021   HCT 46.6 01/31/2021   MCV 90.8 01/31/2021   PLT 230 01/31/2021   Lab Results  Component Value Date   NA 140 01/31/2021   K 4.0 01/31/2021   CO2 24 01/31/2021   GLUCOSE 157 (H) 01/31/2021   BUN 21 01/31/2021   CREATININE 1.32 (H) 01/31/2021   BILITOT 0.8 01/31/2021   AST 20 01/31/2021   ALT 25 01/31/2021   PROT 6.6 01/31/2021   CALCIUM 9.6 01/31/2021   Lab Results  Component Value Date   CHOL 179 01/31/2021   Lab Results  Component Value Date   HDL 46 01/31/2021   Lab Results  Component Value Date   LDLCALC 114 (H) 01/31/2021   Lab Results  Component Value Date   TRIG 89 01/31/2021   Lab Results  Component Value Date   CHOLHDL 3.9 01/31/2021   No results found for: HGBA1C    Assessment & Plan:   Problem List Items Addressed This Visit      Cardiovascular and Mediastinum   Essential hypertension - Primary    Patient blood pressure is normal patient denies any chest pain or shortness of breath there is no history of palpitation or paroxysmal nocturnal dyspnea   patient was advised to follow low-salt low-cholesterol diet    ideally I want to keep systolic blood  pressure below 130 mmHg, patient was asked to check blood pressure one times a week and give me a report on that.  Patient will be follow-up in 3 months  or earlier as  needed, patient will call me back for any change in the cardiovascular symptoms Patient was advised to buy a book from local bookstore concerning blood pressure and read several chapters  every day.  This will be supplemented by some of the material we will give him from the office.  Patient should also utilize other resources like YouTube and Internet to learn more about the blood pressure and the diet.        Endocrine   Hypothyroidism due to Hashimoto's thyroiditis    Patient takes his levothyroxine on a regular basis        Musculoskeletal and Integument   Primary osteoarthritis of right knee    Patient was advised to lose weight.        Other   Dyslipidemia    Hypercholesterolemia  I advised the patient to follow Mediterranean diet This diet is rich in fruits vegetables and whole grain, and This diet is also rich in fish and lean meat Patient should also eat a handful of almonds or walnuts daily Recent heart study indicated that average follow-up on this kind of diet reduces the cardiovascular mortality by 50 to 70%==       Other Visit Diagnoses    Type 2 diabetes mellitus with diabetic neuropathic arthropathy, with long-term current use of insulin (HCC)       Relevant Medications   metFORMIN (GLUCOPHAGE) 500 MG tablet      Meds ordered this encounter  Medications  . metFORMIN (GLUCOPHAGE) 500 MG tablet    Sig: Take 1 tablet (500 mg total) by mouth 2 (two) times daily with a meal.    Dispense:  180 tablet    Refill:  3    Follow-up: No follow-ups on file.    Cletis [place], MD

## 2021-02-18 NOTE — Assessment & Plan Note (Signed)
Hypercholesterolemia  I advised the patient to follow Mediterranean diet This diet is rich in fruits vegetables and whole grain, and This diet is also rich in fish and lean meat Patient should also eat a handful of almonds or walnuts daily Recent heart study indicated that average follow-up on this kind of diet reduces the cardiovascular mortality by 50 to 70%== 

## 2021-02-18 NOTE — Assessment & Plan Note (Signed)
Patient was advised to lose weight 

## 2021-03-25 ENCOUNTER — Encounter: Payer: Self-pay | Admitting: Internal Medicine

## 2021-03-25 ENCOUNTER — Ambulatory Visit (INDEPENDENT_AMBULATORY_CARE_PROVIDER_SITE_OTHER): Payer: Medicare Other | Admitting: Internal Medicine

## 2021-03-25 ENCOUNTER — Other Ambulatory Visit: Payer: Self-pay

## 2021-03-25 VITALS — BP 140/80 | HR 77 | Ht 69.0 in | Wt 201.3 lb

## 2021-03-25 DIAGNOSIS — E11610 Type 2 diabetes mellitus with diabetic neuropathic arthropathy: Secondary | ICD-10-CM | POA: Diagnosis not present

## 2021-03-25 DIAGNOSIS — E785 Hyperlipidemia, unspecified: Secondary | ICD-10-CM

## 2021-03-25 DIAGNOSIS — I10 Essential (primary) hypertension: Secondary | ICD-10-CM | POA: Diagnosis not present

## 2021-03-25 DIAGNOSIS — K4090 Unilateral inguinal hernia, without obstruction or gangrene, not specified as recurrent: Secondary | ICD-10-CM

## 2021-03-25 DIAGNOSIS — M1711 Unilateral primary osteoarthritis, right knee: Secondary | ICD-10-CM

## 2021-03-25 DIAGNOSIS — E038 Other specified hypothyroidism: Secondary | ICD-10-CM

## 2021-03-25 DIAGNOSIS — Z794 Long term (current) use of insulin: Secondary | ICD-10-CM

## 2021-03-25 DIAGNOSIS — N1831 Chronic kidney disease, stage 3a: Secondary | ICD-10-CM | POA: Diagnosis not present

## 2021-03-25 DIAGNOSIS — E063 Autoimmune thyroiditis: Secondary | ICD-10-CM | POA: Diagnosis not present

## 2021-03-25 LAB — GLUCOSE, POCT (MANUAL RESULT ENTRY): POC Glucose: 121 mg/dl — AB (ref 70–99)

## 2021-03-25 NOTE — Progress Notes (Signed)
Established Patient Office Visit  Subjective:  Patient ID: Adam Castillo, male    DOB: 1946-01-24  Age: 75 y.o. MRN: [id number]  CC:  Chief Complaint  Patient presents with   Hypertension    Hypertension   Adam Castillo presents for check up  History reviewed. No pertinent past medical history.  History reviewed. No pertinent surgical history.  History reviewed. No pertinent family history.  Social History   Socioeconomic History   Marital status: Single    Spouse name: Not on file   Number of children: Not on file   Years of education: Not on file   Highest education level: Not on file  Occupational History   Not on file  Tobacco Use   Smoking status: Never   Smokeless tobacco: Never  Substance and Sexual Activity   Alcohol use: Not on file   Drug use: Not on file   Sexual activity: Not on file  Other Topics Concern   Not on file  Social History Narrative   Not on file   Social Determinants of Health   Financial Resource Strain: Not on file  Food Insecurity: Not on file  Transportation Needs: Not on file  Physical Activity: Not on file  Stress: Not on file  Social Connections: Not on file  Intimate Partner Violence: Not on file     Current Outpatient Medications:    Alpha-Lipoic Acid 100 MG CAPS, Take 1 capsule (100 mg total) by mouth daily., Disp: 90 capsule, Rfl: 3   aspirin EC 81 MG tablet, Take 81 mg by mouth daily., Disp: , Rfl:    levothyroxine (SYNTHROID) 50 MCG tablet, TAKE 1 TABLET BY MOUTH EVERY DAY, Disp: 90 tablet, Rfl: 2   lisinopril (ZESTRIL) 40 MG tablet, Take 1 tablet (40 mg total) by mouth daily., Disp: 90 tablet, Rfl: 3   metFORMIN (GLUCOPHAGE) 500 MG tablet, Take 1 tablet (500 mg total) by mouth 2 (two) times daily with a meal., Disp: 180 tablet, Rfl: 3   Multiple Vitamins-Minerals (CENTRUM SILVER 50+MEN PO), Take 1 tablet by mouth daily., Disp: , Rfl:    simvastatin (ZOCOR) 20 MG tablet, TAKE 1 TABLET BY MOUTH EVERY DAY, Disp: 90  tablet, Rfl: 3   No Known Allergies  ROS Review of Systems  Constitutional: Negative.   HENT: Negative.    Eyes: Negative.   Respiratory: Negative.    Cardiovascular: Negative.   Gastrointestinal: Negative.   Endocrine: Negative.   Genitourinary: Negative.   Musculoskeletal: Negative.   Skin: Negative.   Allergic/Immunologic: Negative.   Neurological: Negative.   Hematological: Negative.   Psychiatric/Behavioral: Negative.    All other systems reviewed and are negative.    Objective:    Physical Exam Vitals reviewed.  Constitutional:      Appearance: Normal appearance.  HENT:     Mouth/Throat:     Mouth: Mucous membranes are moist.  Eyes:     Pupils: Pupils are equal, round, and reactive to light.  Neck:     Vascular: No carotid bruit.  Cardiovascular:     Rate and Rhythm: Normal rate and regular rhythm.     Pulses: Normal pulses.     Heart sounds: Normal heart sounds.  Pulmonary:     Effort: Pulmonary effort is normal.     Breath sounds: Normal breath sounds.  Abdominal:     General: Bowel sounds are normal.     Palpations: Abdomen is soft. There is no hepatomegaly, splenomegaly or mass.     Tenderness:  There is no abdominal tenderness.     Hernia: No hernia is present.  Musculoskeletal:     Cervical back: Neck supple.     Right lower leg: No edema.     Left lower leg: No edema.  Skin:    Findings: No rash.  Neurological:     Mental Status: He is alert and oriented to person, place, and time.     Motor: No weakness.  Psychiatric:        Mood and Affect: Mood normal.        Behavior: Behavior normal.    BP 140/80   Pulse 77   Ht 5\' 9"  (1.753 m)   Wt 201 lb 4.8 oz (91.3 kg)   BMI 29.73 kg/m  Wt Readings from Last 3 Encounters:  03/25/21 201 lb 4.8 oz (91.3 kg)  02/18/21 204 lb 6.4 oz (92.7 kg)  01/30/21 205 lb 9.6 oz (93.3 kg)     Health Maintenance Due  Topic Date Due   COVID-19 Vaccine (1) Never done   Hepatitis C Screening  Never done    COLONOSCOPY (Pts 45-37yrs Insurance coverage will need to be confirmed)  Never done   Zoster Vaccines- Shingrix (1 of 2) Never done   PNA vac Low Risk Adult (1 of 2 - PCV13) Never done    There are no preventive care reminders to display for this patient.  Lab Results  Component Value Date   TSH 2.92 01/31/2021   Lab Results  Component Value Date   WBC 4.5 01/31/2021   HGB 15.7 01/31/2021   HCT 46.6 01/31/2021   MCV 90.8 01/31/2021   PLT 230 01/31/2021   Lab Results  Component Value Date   NA 140 01/31/2021   K 4.0 01/31/2021   CO2 24 01/31/2021   GLUCOSE 157 (H) 01/31/2021   BUN 21 01/31/2021   CREATININE 1.32 (H) 01/31/2021   BILITOT 0.8 01/31/2021   AST 20 01/31/2021   ALT 25 01/31/2021   PROT 6.6 01/31/2021   CALCIUM 9.6 01/31/2021   Lab Results  Component Value Date   CHOL 179 01/31/2021   Lab Results  Component Value Date   HDL 46 01/31/2021   Lab Results  Component Value Date   LDLCALC 114 (H) 01/31/2021   Lab Results  Component Value Date   TRIG 89 01/31/2021   Lab Results  Component Value Date   CHOLHDL 3.9 01/31/2021   No results found for: HGBA1C    Assessment & Plan:   Problem List Items Addressed This Visit       Cardiovascular and Mediastinum   Essential hypertension     Patient denies any chest pain or shortness of breath there is no history of palpitation or paroxysmal nocturnal dyspnea   patient was advised to follow low-salt low-cholesterol diet    ideally I want to keep systolic blood pressure below 130 mmHg, patient was asked to check blood pressure one times a week and give me a report on that.  Patient will be follow-up in 3 months  or earlier as needed, patient will call me back for any change in the cardiovascular symptoms Patient was advised to buy a book from local bookstore concerning blood pressure and read several chapters  every day.  This will be supplemented by some of the material we will give him from the office.   Patient should also utilize other resources like YouTube and Internet to learn more about the blood pressure and the diet.  Endocrine   Hypothyroidism due to Hashimoto's thyroiditis    Stable at the present time         Musculoskeletal and Integument   Primary osteoarthritis of right knee    Patient is doing well, Pain is stable         Genitourinary   Stage 3a chronic kidney disease ([place])    Patient was advised to not to take ibuprofen or Motrin         Other   Dyslipidemia    Hypercholesterolemia  I advised the patient to follow Mediterranean diet This diet is rich in fruits vegetables and whole grain, and This diet is also rich in fish and lean meat Patient should also eat a handful of almonds or walnuts daily Recent heart study indicated that average follow-up on this kind of diet reduces the cardiovascular mortality by 50 to 70%==       Hernia of scrotum   Other Visit Diagnoses     Type 2 diabetes mellitus with diabetic neuropathic arthropathy, with long-term current use of insulin ([place])    -  Primary   Relevant Orders   POCT glucose (manual entry) (Completed)       No orders of the defined types were placed in this encounter.   Follow-up: No follow-ups on file.    Cletis [place], MD

## 2021-03-26 NOTE — Assessment & Plan Note (Signed)
Patient is doing well, Pain is stable

## 2021-03-26 NOTE — Assessment & Plan Note (Signed)
Patient was advised to not to take ibuprofen or Motrin

## 2021-03-26 NOTE — Assessment & Plan Note (Signed)

## 2021-03-26 NOTE — Assessment & Plan Note (Signed)
Stable at the present time. 

## 2021-03-26 NOTE — Assessment & Plan Note (Signed)
Hypercholesterolemia  I advised the patient to follow Mediterranean diet This diet is rich in fruits vegetables and whole grain, and This diet is also rich in fish and lean meat Patient should also eat a handful of almonds or walnuts daily Recent heart study indicated that average follow-up on this kind of diet reduces the cardiovascular mortality by 50 to 70%== 

## 2021-03-27 ENCOUNTER — Other Ambulatory Visit: Payer: Self-pay | Admitting: Internal Medicine

## 2021-06-15 DIAGNOSIS — Z23 Encounter for immunization: Secondary | ICD-10-CM | POA: Diagnosis not present

## 2021-06-25 ENCOUNTER — Other Ambulatory Visit: Payer: Self-pay | Admitting: Internal Medicine

## 2021-08-05 ENCOUNTER — Encounter: Payer: Self-pay | Admitting: Internal Medicine

## 2021-08-05 ENCOUNTER — Other Ambulatory Visit: Payer: Self-pay

## 2021-08-05 ENCOUNTER — Ambulatory Visit (INDEPENDENT_AMBULATORY_CARE_PROVIDER_SITE_OTHER): Payer: Medicare Other | Admitting: Internal Medicine

## 2021-08-05 VITALS — BP 148/88 | HR 86 | Ht 69.0 in | Wt 201.3 lb

## 2021-08-05 DIAGNOSIS — M1711 Unilateral primary osteoarthritis, right knee: Secondary | ICD-10-CM

## 2021-08-05 DIAGNOSIS — E785 Hyperlipidemia, unspecified: Secondary | ICD-10-CM

## 2021-08-05 DIAGNOSIS — Z1211 Encounter for screening for malignant neoplasm of colon: Secondary | ICD-10-CM | POA: Diagnosis not present

## 2021-08-05 DIAGNOSIS — E063 Autoimmune thyroiditis: Secondary | ICD-10-CM

## 2021-08-05 DIAGNOSIS — K4090 Unilateral inguinal hernia, without obstruction or gangrene, not specified as recurrent: Secondary | ICD-10-CM | POA: Diagnosis not present

## 2021-08-05 DIAGNOSIS — N1831 Chronic kidney disease, stage 3a: Secondary | ICD-10-CM

## 2021-08-05 DIAGNOSIS — E038 Other specified hypothyroidism: Secondary | ICD-10-CM

## 2021-08-05 DIAGNOSIS — I10 Essential (primary) hypertension: Secondary | ICD-10-CM

## 2021-08-05 DIAGNOSIS — Z0000 Encounter for general adult medical examination without abnormal findings: Secondary | ICD-10-CM

## 2021-08-05 NOTE — Assessment & Plan Note (Signed)
Stable

## 2021-08-05 NOTE — Assessment & Plan Note (Signed)
General physical exam is normal patient was advised to lose weight walk on a daily basis .  Heart is regular chest is clear abdominal soft nontender he has a large right inguinal hernia and will be referred to a surgeon for further evaluation.  There is no pedal edema or calf tenderness.  Neurological examination is nonfocal.  Prostate examination is found to be normal.

## 2021-08-05 NOTE — Assessment & Plan Note (Signed)
We will follow the lab test and urine analysis

## 2021-08-05 NOTE — Assessment & Plan Note (Signed)
Patient was referred to the surgeons of the evaluation of the right inguinal hernia which is going down to the scrotal area

## 2021-08-05 NOTE — Assessment & Plan Note (Signed)

## 2021-08-05 NOTE — Assessment & Plan Note (Signed)
Hypercholesterolemia  I advised the patient to follow Mediterranean diet This diet is rich in fruits vegetables and whole grain, and This diet is also rich in fish and lean meat Patient should also eat a handful of almonds or walnuts daily Recent heart study indicated that average follow-up on this kind of diet reduces the cardiovascular mortality by 50 to 70%== 

## 2021-08-05 NOTE — Assessment & Plan Note (Signed)
Stable at the present time. 

## 2021-08-05 NOTE — Progress Notes (Signed)
Established Patient Office Visit  Subjective:  Patient ID: Adam Castillo, male    DOB: 1946/06/30  Age: 75 y.o. MRN: [id number]  CC:  Chief Complaint  Patient presents with   Annual Exam    HPI  Adam Castillo presents for rt inguinal hernia evaluation and physical  History reviewed. No pertinent past medical history.  History reviewed. No pertinent surgical history.  History reviewed. No pertinent family history.  Social History   Socioeconomic History   Marital status: Single    Spouse name: Not on file   Number of children: Not on file   Years of education: Not on file   Highest education level: Not on file  Occupational History   Not on file  Tobacco Use   Smoking status: Never   Smokeless tobacco: Never  Substance and Sexual Activity   Alcohol use: Not on file   Drug use: Not on file   Sexual activity: Not on file  Other Topics Concern   Not on file  Social History Narrative   Not on file   Social Determinants of Health   Financial Resource Strain: Not on file  Food Insecurity: Not on file  Transportation Needs: Not on file  Physical Activity: Not on file  Stress: Not on file  Social Connections: Not on file  Intimate Partner Violence: Not on file     Current Outpatient Medications:    Alpha-Lipoic Acid 100 MG CAPS, Take 1 capsule (100 mg total) by mouth daily., Disp: 90 capsule, Rfl: 3   aspirin EC 81 MG tablet, Take 81 mg by mouth daily., Disp: , Rfl:    levothyroxine (SYNTHROID) 50 MCG tablet, TAKE 1 TABLET BY MOUTH EVERY DAY, Disp: 90 tablet, Rfl: 2   lisinopril (ZESTRIL) 40 MG tablet, TAKE 1 TABLET BY MOUTH EVERY DAY, Disp: 90 tablet, Rfl: 3   metFORMIN (GLUCOPHAGE) 500 MG tablet, Take 1 tablet (500 mg total) by mouth 2 (two) times daily with a meal., Disp: 180 tablet, Rfl: 3   Multiple Vitamins-Minerals (CENTRUM SILVER 50+MEN PO), Take 1 tablet by mouth daily., Disp: , Rfl:    simvastatin (ZOCOR) 20 MG tablet, TAKE 1 TABLET BY MOUTH EVERY DAY,  Disp: 90 tablet, Rfl: 3   No Known Allergies  ROS Review of Systems  Constitutional: Negative.   HENT: Negative.    Eyes: Negative.   Respiratory: Negative.    Cardiovascular: Negative.   Gastrointestinal: Negative.   Endocrine: Negative.   Genitourinary: Negative.   Musculoskeletal: Negative.   Skin: Negative.   Allergic/Immunologic: Negative.   Neurological: Negative.   Hematological: Negative.   Psychiatric/Behavioral: Negative.    All other systems reviewed and are negative.    Objective:    Physical Exam Vitals reviewed.  Constitutional:      Appearance: Normal appearance.  HENT:     Mouth/Throat:     Mouth: Mucous membranes are moist.  Eyes:     Pupils: Pupils are equal, round, and reactive to light.  Neck:     Vascular: No carotid bruit.  Cardiovascular:     Rate and Rhythm: Normal rate and regular rhythm.     Pulses: Normal pulses.     Heart sounds: Normal heart sounds.  Pulmonary:     Effort: Pulmonary effort is normal.     Breath sounds: Normal breath sounds.  Abdominal:     General: Bowel sounds are normal.     Palpations: Abdomen is soft. There is no hepatomegaly, splenomegaly or mass.  Tenderness: There is no abdominal tenderness.     Hernia: No hernia is present.       Comments: Right inguinal hernia  Genitourinary:    Comments: Prostate size is normal Musculoskeletal:     Cervical back: Neck supple.     Right lower leg: No edema.     Left lower leg: No edema.  Skin:    Findings: No rash.  Neurological:     Mental Status: He is alert and oriented to person, place, and time.     Motor: No weakness.  Psychiatric:        Mood and Affect: Mood normal.        Behavior: Behavior normal.    BP (!) 148/88   Pulse 86   Ht 5\' 9"  (1.753 m)   Wt 201 lb 4.8 oz (91.3 kg)   BMI 29.73 kg/m  Wt Readings from Last 3 Encounters:  08/05/21 201 lb 4.8 oz (91.3 kg)  03/25/21 201 lb 4.8 oz (91.3 kg)  02/18/21 204 lb 6.4 oz (92.7 kg)     Health  Maintenance Due  Topic Date Due   Hepatitis C Screening  Never done   COLONOSCOPY (Pts 45-43yrs Insurance coverage will need to be confirmed)  Never done   COVID-19 Vaccine (3 - Booster for [place] series) 01/12/2020    There are no preventive care reminders to display for this patient.  Lab Results  Component Value Date   TSH 2.92 01/31/2021   Lab Results  Component Value Date   WBC 4.5 01/31/2021   HGB 15.7 01/31/2021   HCT 46.6 01/31/2021   MCV 90.8 01/31/2021   PLT 230 01/31/2021   Lab Results  Component Value Date   NA 140 01/31/2021   K 4.0 01/31/2021   CO2 24 01/31/2021   GLUCOSE 157 (H) 01/31/2021   BUN 21 01/31/2021   CREATININE 1.32 (H) 01/31/2021   BILITOT 0.8 01/31/2021   AST 20 01/31/2021   ALT 25 01/31/2021   PROT 6.6 01/31/2021   CALCIUM 9.6 01/31/2021   Lab Results  Component Value Date   CHOL 179 01/31/2021   Lab Results  Component Value Date   HDL 46 01/31/2021   Lab Results  Component Value Date   LDLCALC 114 (H) 01/31/2021   Lab Results  Component Value Date   TRIG 89 01/31/2021   Lab Results  Component Value Date   CHOLHDL 3.9 01/31/2021   No results found for: HGBA1C    Assessment & Plan:   Problem List Items Addressed This Visit       Cardiovascular and Mediastinum   Essential hypertension     Patient denies any chest pain or shortness of breath there is no history of palpitation or paroxysmal nocturnal dyspnea   patient was advised to follow low-salt low-cholesterol diet    ideally I want to keep systolic blood pressure below 130 mmHg, patient was asked to check blood pressure one times a week and give me a report on that.  Patient will be follow-up in 3 months  or earlier as needed, patient will call me back for any change in the cardiovascular symptoms Patient was advised to buy a book from local bookstore concerning blood pressure and read several chapters  every day.  This will be supplemented by some of the material we  will give him from the office.  Patient should also utilize other resources like YouTube and Internet to learn more about the blood pressure and the diet.  Endocrine   Hypothyroidism due to Hashimoto's thyroiditis    Stable at the present time        Musculoskeletal and Integument   Primary osteoarthritis of right knee    Stable        Genitourinary   Stage 3a chronic kidney disease (HCC)    We will follow the lab test and urine analysis        Other   Dyslipidemia    Hypercholesterolemia  I advised the patient to follow Mediterranean diet This diet is rich in fruits vegetables and whole grain, and This diet is also rich in fish and lean meat Patient should also eat a handful of almonds or walnuts daily Recent heart study indicated that average follow-up on this kind of diet reduces the cardiovascular mortality by 50 to 70%==      Annual physical exam    General physical exam is normal patient was advised to lose weight walk on a daily basis .  Heart is regular chest is clear abdominal soft nontender he has a large right inguinal hernia and will be referred to a surgeon for further evaluation.  There is no pedal edema or calf tenderness.  Neurological examination is nonfocal.  Prostate examination is found to be normal.      Hernia of scrotum    Patient was referred to the surgeons of the evaluation of the right inguinal hernia which is going down to the scrotal area      Other Visit Diagnoses     Screening for colon cancer    -  Primary   Relevant Orders   Ambulatory referral to Gastroenterology       No orders of the defined types were placed in this encounter.   Follow-up: No follow-ups on file.    Cletis [place], MD

## 2021-08-05 NOTE — Addendum Note (Signed)
Addended by: Alois Cliche on: 08/05/2021 10:37 AM   Modules accepted: Orders

## 2021-08-26 ENCOUNTER — Telehealth: Payer: Self-pay

## 2021-08-26 ENCOUNTER — Other Ambulatory Visit: Payer: Self-pay

## 2021-08-26 ENCOUNTER — Telehealth: Payer: Self-pay | Admitting: Surgery

## 2021-08-26 ENCOUNTER — Ambulatory Visit (INDEPENDENT_AMBULATORY_CARE_PROVIDER_SITE_OTHER): Payer: Medicare Other | Admitting: Surgery

## 2021-08-26 ENCOUNTER — Encounter: Payer: Self-pay | Admitting: Surgery

## 2021-08-26 VITALS — BP 182/88 | HR 90 | Temp 97.8°F | Ht 69.0 in | Wt 200.6 lb

## 2021-08-26 DIAGNOSIS — K4090 Unilateral inguinal hernia, without obstruction or gangrene, not specified as recurrent: Secondary | ICD-10-CM

## 2021-08-26 NOTE — Progress Notes (Signed)
Patient ID: Adam Castillo, male   DOB: 1946-04-26, 75 y.o.   MRN: [id number]  HPI Adam Castillo is a 75 y.o. male seen in consultation for a symptomatic right inguinal hernia.  He reports that he has had this for about a year or so he experiences some intermittent pain.  No fevers no chills.  Pain seems to be worsening when he wears tight pants and belt.  Pain is intermittent sharp moderate intensity.  No specific aggravating factors.  He is able to perform more than 4 METS of activity without any shortness of breath or chest pain.  He is a diabetic.  His glucose is 157 last time.  Creatinine is 1.32.  TSH was normal.  CBC completely normal He takes a baby aspirin a day. No prior abdominal operations. No strokes or heart attacks   HPI  Past Medical History:  Diagnosis Date   Diabetes mellitus without complication ([place])    Hypertension    Thyroid disease     History reviewed. No pertinent surgical history.  Family History  Problem Relation Age of Onset   COPD Mother    Heart disease Father     Social History Social History   Tobacco Use   Smoking status: Never   Smokeless tobacco: Never  Substance Use Topics   Alcohol use: Never   Drug use: Never    No Known Allergies  Current Outpatient Medications  Medication Sig Dispense Refill   Alpha-Lipoic Acid 100 MG CAPS Take 1 capsule (100 mg total) by mouth daily. 90 capsule 3   aspirin EC 81 MG tablet Take 81 mg by mouth daily.     levothyroxine (SYNTHROID) 50 MCG tablet TAKE 1 TABLET BY MOUTH EVERY DAY 90 tablet 2   lisinopril (ZESTRIL) 40 MG tablet TAKE 1 TABLET BY MOUTH EVERY DAY 90 tablet 3   metFORMIN (GLUCOPHAGE) 500 MG tablet Take 1 tablet (500 mg total) by mouth 2 (two) times daily with a meal. 180 tablet 3   Multiple Vitamins-Minerals (CENTRUM SILVER 50+MEN PO) Take 1 tablet by mouth daily.     simvastatin (ZOCOR) 20 MG tablet TAKE 1 TABLET BY MOUTH EVERY DAY 90 tablet 3   No current facility-administered medications for  this visit.     Review of Systems Full ROS  was asked and was negative except for the information on the HPI  Physical Exam Blood pressure (!) 182/88, pulse 90, temperature 97.8 F (36.6 C), temperature source Oral, height 5\' 9"  (1.753 m), weight 200 lb 9.6 oz (91 kg), SpO2 97 %. CONSTITUTIONAL: NAD. EYES: Pupils are equal, round, Sclera are non-icteric. EARS, NOSE, MOUTH AND THROAT: He is wearing a mask. Hearing is intact to voice. LYMPH NODES:  Lymph nodes in the neck are normal. RESPIRATORY:  Lungs are clear. There is normal respiratory effort, with equal breath sounds bilaterally, and without pathologic use of accessory muscles. CARDIOVASCULAR: Heart is regular without murmurs, gallops, or rubs. GI: The abdomen is  soft, nontender, and nondistended. There are no palpable masses. There is no hepatosplenomegaly. There are normal bowel sounds  He does have a large reducible right inguinal hernia mildly tender to palpation.  He has some hydrocele on the left side and its uncertain whether or not there is also component of an inguinal hernia in the left side GU: Rectal deferred.   MUSCULOSKELETAL: Normal muscle strength and tone. No cyanosis or edema.   SKIN: Turgor is good and there are no pathologic skin lesions or ulcers. NEUROLOGIC: Motor  and sensation is grossly normal. Cranial nerves are grossly intact. PSYCH:  Oriented to person, place and time. Affect is normal.  Data Reviewed  I have personally reviewed the patient's imaging, laboratory findings and medical records.    Assessment/Plan 75 year old male with a symptomatic right inguinal hernia.  Discussed with the patient in detail and I do recommend repair.  It is not Hund percent certain whether or not he has an inguinal hernia on the left side.  Discussed with him that we will proceed with repair robotically of the right side and if we were to observe an inguinal hernia in the left side we will go ahead and take care of it  during the same operative setting.  Procedure discussed with the patient in detail.  Risks, benefits and possible complications including but not limited to: Bleeding, infection injury to adjacent organs, chronic pain, recurrence.  I do think he is a good candidate for robotic approach. Please note that I spent total of 60 minutes including personally reviewing records, coordinating his care, placing orders, counseling the patient and performing appropriate documentation   Caroleen Hamman, MD FACS General Surgeon 08/26/2021, 9:37 AM

## 2021-08-26 NOTE — Telephone Encounter (Signed)
Patient in person, has been advised of Pre-Admission date/time, COVID Testing date and Surgery date.  Surgery Date: 08/27/21 arrive @ 8:30 am, patient informed not to eat or drink after midnight tonight  Preadmission Testing Date: 08/27/21 arrive 8:30 am.   Covid Testing Date: Not needed.    This was an add on for next day surgery.  Patient given all instructions and verbalized understanding.

## 2021-08-26 NOTE — Patient Instructions (Addendum)
Medical Clearance faxed to [facility] today. Our surgery scheduler will call you within 24-48 hours to schedule your surgery. Please have the Blue have the [facility] surgery sheet available when speaking with her.    Hernia, Adult   A hernia is the bulging of an organ or tissue through a weak spot in the muscles of the abdomen. Hernias develop most often near the belly button (navel) or the area where the leg meets the lower abdomen (groin). Common types of hernias include: Incisional hernia. This type bulges through a scar from an abdominal surgery. Umbilical hernia. This type develops near the navel. Inguinal hernia. This type develops in the groin or scrotum. Femoral hernia. This type develops below the groin, in the upper thigh area. Hiatal hernia. This type occurs when part of the stomach slides above the muscle that separates the abdomen from the chest (diaphragm). What are the causes? This condition may be caused by: Heavy lifting. Coughing over a long period of time. Straining to have a bowel movement. Constipation can lead to straining. An incision made during abdominal surgery. A physical problem that is present at birth (congenital defect). Being overweight or obese. Smoking. Excess fluid in the abdomen. Undescended testicles in males. What are the signs or symptoms? The main symptom is a skin-colored, rounded bulge in the area of the hernia. However, a bulge may not always be present. It may grow bigger or be more visible when you cough or strain (such as when lifting something heavy). A hernia that can be pushed back into the abdomen (is reducible) rarely causes pain. A hernia that cannot be pushed back into the abdomen (is incarcerated) may lose its blood supply (become strangulated). A hernia that is incarcerated may cause: Pain. Fever. Nausea and vomiting. Swelling. Constipation. How is this diagnosed? A hernia may be diagnosed based on: Your symptoms and medical  history. A physical exam. Your health care provider may ask you to cough or move in certain ways to see if the hernia becomes visible. Imaging tests, such as: X-rays. Ultrasound. CT scan. How is this treated? A hernia that is small and painless may not need to be treated. A hernia that is large or painful may be treated with surgery. Inguinal hernias may be treated with surgery to prevent incarceration or strangulation. Strangulated hernias are always treated with surgery because the strangulation causes a lack of blood supply to the trapped organ or tissue. Surgery to treat a hernia involves pushing the bulge back into place and repairing the weak area of the muscle or abdominal wall. Follow these instructions at home: Activity Avoid straining. Do not lift anything that is heavier than 10 lb (4.5 kg), or the limit that you are told, until your health care provider says that it is safe. When lifting heavy objects, lift with your leg muscles, not your back muscles. Preventing constipation Take actions to prevent constipation. Constipation leads to straining with bowel movements, which can make a hernia worse or cause a hernia repair to break down. Your health care provider may recommend that you take these actions to prevent or treat constipation: Drink enough fluid to keep your urine pale yellow. Take over-the-counter or prescription medicines. Eat foods that are high in fiber, such as beans, whole grains, and fresh fruits and vegetables. Limit foods that are high in fat and processed sugars, such as fried or sweet foods. General instructions When coughing, try to cough gently. You may try to push the hernia back in place  by very gently pressing on it while lying down. Do not try to force the bulge back in if it will not push in easily. If you are overweight, work with your health care provider to lose weight safely. Do not use any products that contain nicotine or tobacco. These products  include cigarettes, chewing tobacco, and vaping devices, such as e-cigarettes. If you need help quitting, ask your health care provider. If you are scheduled for hernia repair, watch your hernia for any changes in shape, size, or color. Tell your health care provider about any changes or new symptoms. Take over-the-counter and prescription medicines only as told by your health care provider. Keep all follow-up visits. This is important. Contact a health care provider if: You develop new pain, swelling, or redness around your hernia. You have signs of constipation, such as: Fewer bowel movements in a week than normal. Difficulty having a bowel movement. Stools that are dry, hard, or larger than normal. Get help right away if: You have a fever or chills. You have abdominal pain that gets worse. You feel nauseous or you vomit. You cannot push the hernia back in place by very gently pressing on it while lying down. Do not try to force the bulge back in if it will not go in easily. The hernia: Changes in shape, size, or color. Feels hard or tender. These symptoms may represent a serious problem that is an emergency. Do not wait to see if the symptoms will go away. Get medical help right away. Call your local emergency services (911 in the [place] Do not drive yourself to the hospital. Summary A hernia is the bulging of an organ or tissue through a weak spot in the muscles of the abdomen. The main symptom is a skin-colored bulge in the hernia area. However, a bulge may not always be present. It may grow bigger or more visible when you cough or strain (such as when having a bowel movement). A hernia that is small and painless may not need to be treated. A hernia that is large or painful may be treated with surgery. Surgery to treat a hernia involves pushing the bulge back into place and repairing the weak part of the abdomen. This information is not intended to replace advice given to you by your  health care provider. Make sure you discuss any questions you have with your health care provider. Document Revised: 04/09/2020 Document Reviewed: 04/09/2020 Elsevier Patient Education  [place].

## 2021-08-26 NOTE — H&P (View-Only) (Signed)
Patient ID: Adam Castillo, male   DOB: 1945-12-09, 75 y.o.   MRN: [id number]  HPI Adam Castillo is a 74 y.o. male seen in consultation for a symptomatic right inguinal hernia.  He reports that he has had this for about a year or so he experiences some intermittent pain.  No fevers no chills.  Pain seems to be worsening when he wears tight pants and belt.  Pain is intermittent sharp moderate intensity.  No specific aggravating factors.  He is able to perform more than 4 METS of activity without any shortness of breath or chest pain.  He is a diabetic.  His glucose is 157 last time.  Creatinine is 1.32.  TSH was normal.  CBC completely normal He takes a baby aspirin a day. No prior abdominal operations. No strokes or heart attacks   HPI  Past Medical History:  Diagnosis Date   Diabetes mellitus without complication ([place])    Hypertension    Thyroid disease     History reviewed. No pertinent surgical history.  Family History  Problem Relation Age of Onset   COPD Mother    Heart disease Father     Social History Social History   Tobacco Use   Smoking status: Never   Smokeless tobacco: Never  Substance Use Topics   Alcohol use: Never   Drug use: Never    No Known Allergies  Current Outpatient Medications  Medication Sig Dispense Refill   Alpha-Lipoic Acid 100 MG CAPS Take 1 capsule (100 mg total) by mouth daily. 90 capsule 3   aspirin EC 81 MG tablet Take 81 mg by mouth daily.     levothyroxine (SYNTHROID) 50 MCG tablet TAKE 1 TABLET BY MOUTH EVERY DAY 90 tablet 2   lisinopril (ZESTRIL) 40 MG tablet TAKE 1 TABLET BY MOUTH EVERY DAY 90 tablet 3   metFORMIN (GLUCOPHAGE) 500 MG tablet Take 1 tablet (500 mg total) by mouth 2 (two) times daily with a meal. 180 tablet 3   Multiple Vitamins-Minerals (CENTRUM SILVER 50+MEN PO) Take 1 tablet by mouth daily.     simvastatin (ZOCOR) 20 MG tablet TAKE 1 TABLET BY MOUTH EVERY DAY 90 tablet 3   No current facility-administered medications for  this visit.     Review of Systems Full ROS  was asked and was negative except for the information on the HPI  Physical Exam Blood pressure (!) 182/88, pulse 90, temperature 97.8 F (36.6 C), temperature source Oral, height 5\' 9"  (1.753 m), weight 200 lb 9.6 oz (91 kg), SpO2 97 %. CONSTITUTIONAL: NAD. EYES: Pupils are equal, round, Sclera are non-icteric. EARS, NOSE, MOUTH AND THROAT: He is wearing a mask. Hearing is intact to voice. LYMPH NODES:  Lymph nodes in the neck are normal. RESPIRATORY:  Lungs are clear. There is normal respiratory effort, with equal breath sounds bilaterally, and without pathologic use of accessory muscles. CARDIOVASCULAR: Heart is regular without murmurs, gallops, or rubs. GI: The abdomen is  soft, nontender, and nondistended. There are no palpable masses. There is no hepatosplenomegaly. There are normal bowel sounds  He does have a large reducible right inguinal hernia mildly tender to palpation.  He has some hydrocele on the left side and its uncertain whether or not there is also component of an inguinal hernia in the left side GU: Rectal deferred.   MUSCULOSKELETAL: Normal muscle strength and tone. No cyanosis or edema.   SKIN: Turgor is good and there are no pathologic skin lesions or ulcers. NEUROLOGIC: Motor  and sensation is grossly normal. Cranial nerves are grossly intact. PSYCH:  Oriented to person, place and time. Affect is normal.  Data Reviewed  I have personally reviewed the patient's imaging, laboratory findings and medical records.    Assessment/Plan 75 year old male with a symptomatic right inguinal hernia.  Discussed with the patient in detail and I do recommend repair.  It is not Hund percent certain whether or not he has an inguinal hernia on the left side.  Discussed with him that we will proceed with repair robotically of the right side and if we were to observe an inguinal hernia in the left side we will go ahead and take care of it  during the same operative setting.  Procedure discussed with the patient in detail.  Risks, benefits and possible complications including but not limited to: Bleeding, infection injury to adjacent organs, chronic pain, recurrence.  I do think he is a good candidate for robotic approach. Please note that I spent total of 60 minutes including personally reviewing records, coordinating his care, placing orders, counseling the patient and performing appropriate documentation   Caroleen Hamman, MD FACS General Surgeon 08/26/2021, 9:37 AM

## 2021-08-26 NOTE — Telephone Encounter (Signed)
Medical Clearance faxed to [facility].

## 2021-08-26 NOTE — Telephone Encounter (Signed)
Received medical clearance from Dr. Cletis [place] Pt's procedure risk assessment is low. He is optimized for surgery.

## 2021-08-27 ENCOUNTER — Other Ambulatory Visit: Payer: Self-pay

## 2021-08-27 ENCOUNTER — Ambulatory Visit
Admission: RE | Admit: 2021-08-27 | Discharge: 2021-08-27 | Disposition: A | Discharge status: Home / Self Care | Payer: Medicare Other | Attending: Surgery | Admitting: Surgery

## 2021-08-27 ENCOUNTER — Ambulatory Visit: Payer: Medicare Other | Admitting: Certified Registered Nurse Anesthetist

## 2021-08-27 ENCOUNTER — Encounter: Payer: Self-pay | Admitting: Surgery

## 2021-08-27 ENCOUNTER — Encounter
Admission: RE | Disposition: A | Discharge status: Home / Self Care | Payer: Self-pay | Source: Home / Self Care | Attending: Surgery

## 2021-08-27 DIAGNOSIS — I10 Essential (primary) hypertension: Secondary | ICD-10-CM | POA: Insufficient documentation

## 2021-08-27 DIAGNOSIS — Z7984 Long term (current) use of oral hypoglycemic drugs: Secondary | ICD-10-CM | POA: Insufficient documentation

## 2021-08-27 DIAGNOSIS — E119 Type 2 diabetes mellitus without complications: Secondary | ICD-10-CM | POA: Insufficient documentation

## 2021-08-27 DIAGNOSIS — K4020 Bilateral inguinal hernia, without obstruction or gangrene, not specified as recurrent: Secondary | ICD-10-CM | POA: Diagnosis not present

## 2021-08-27 DIAGNOSIS — Z01818 Encounter for other preprocedural examination: Secondary | ICD-10-CM | POA: Diagnosis not present

## 2021-08-27 DIAGNOSIS — E785 Hyperlipidemia, unspecified: Secondary | ICD-10-CM | POA: Diagnosis not present

## 2021-08-27 DIAGNOSIS — Z79899 Other long term (current) drug therapy: Secondary | ICD-10-CM | POA: Insufficient documentation

## 2021-08-27 DIAGNOSIS — K4090 Unilateral inguinal hernia, without obstruction or gangrene, not specified as recurrent: Secondary | ICD-10-CM | POA: Diagnosis not present

## 2021-08-27 DIAGNOSIS — K4091 Unilateral inguinal hernia, without obstruction or gangrene, recurrent: Secondary | ICD-10-CM

## 2021-08-27 HISTORY — PX: INSERTION OF MESH: SHX5868

## 2021-08-27 HISTORY — PX: HERNIORRHAPHY, INGUINAL, ROBOT-ASSISTED, LAPAROSCOPIC: SHX7585

## 2021-08-27 LAB — BASIC METABOLIC PANEL
Anion gap: 5 (ref 5–15)
BUN: 21 mg/dL (ref 8–23)
CO2: 27 mmol/L (ref 22–32)
Calcium: 9.4 mg/dL (ref 8.9–10.3)
Chloride: 106 mmol/L (ref 98–111)
Creatinine, Ser: 1.17 mg/dL (ref 0.61–1.24)
GFR, Estimated: >60 mL/min (ref >60)
Glucose, Bld: 144 mg/dL — ABNORMAL HIGH (ref 70–99)
Potassium: 4.2 mmol/L (ref 3.5–5.1)
Sodium: 138 mmol/L (ref 135–145)

## 2021-08-27 LAB — CBC
HCT: 43.8 % (ref 39.0–52.0)
Hemoglobin: 15.6 g/dL (ref 13.0–17.0)
MCH: 31 pg (ref 26.0–34.0)
MCHC: 35.6 g/dL (ref 30.0–36.0)
MCV: 86.9 fL (ref 80.0–100.0)
Platelets: 225 10*3/uL (ref 150–400)
RBC: 5.04 MIL/uL (ref 4.22–5.81)
RDW: 12.8 % (ref 11.5–15.5)
WBC: 5.2 10*3/uL (ref 4.0–10.5)
nRBC: 0 % (ref 0.0–0.2)

## 2021-08-27 LAB — GLUCOSE, CAPILLARY
Glucose-Capillary: 132 mg/dL — ABNORMAL HIGH (ref 70–99)
Glucose-Capillary: 155 mg/dL — ABNORMAL HIGH (ref 70–99)

## 2021-08-27 SURGERY — HERNIORRHAPHY, INGUINAL, ROBOT-ASSISTED, LAPAROSCOPIC
Anesthesia: General | Laterality: Bilateral

## 2021-08-27 MED ORDER — GABAPENTIN 300 MG PO CAPS
ORAL_CAPSULE | ORAL | Status: AC
  Administered 2021-08-27: 300 mg via ORAL
  Filled 2021-08-27: qty 1

## 2021-08-27 MED ORDER — BUPIVACAINE-EPINEPHRINE 0.25% -1:200000 IJ SOLN
INTRAMUSCULAR | Status: DC | PRN
  Administered 2021-08-27: 30 mL

## 2021-08-27 MED ORDER — ESMOLOL HCL 100 MG/10ML IV SOLN
INTRAVENOUS | Status: AC
  Filled 2021-08-27: qty 20

## 2021-08-27 MED ORDER — CEFAZOLIN SODIUM-DEXTROSE 2-4 GM/100ML-% IV SOLN
2.0000 g | INTRAVENOUS | Status: AC
  Administered 2021-08-27: 2 g via INTRAVENOUS

## 2021-08-27 MED ORDER — CHLORHEXIDINE GLUCONATE CLOTH 2 % EX PADS
6.0000 | MEDICATED_PAD | Freq: Once | CUTANEOUS | Status: AC
  Administered 2021-08-27: 6 via TOPICAL

## 2021-08-27 MED ORDER — HYDROCODONE-ACETAMINOPHEN 5-325 MG PO TABS: 1.0000 | ORAL_TABLET | Freq: Four times a day (QID) | ORAL | 0 refills | Status: DC | PRN

## 2021-08-27 MED ORDER — ROCURONIUM BROMIDE 100 MG/10ML IV SOLN
INTRAVENOUS | Status: DC | PRN
  Administered 2021-08-27: 20 mg via INTRAVENOUS
  Administered 2021-08-27: 50 mg via INTRAVENOUS

## 2021-08-27 MED ORDER — GABAPENTIN 300 MG PO CAPS: 300.0000 mg | ORAL_CAPSULE | ORAL | Status: AC

## 2021-08-27 MED ORDER — PHENYLEPHRINE 40 MCG/ML (10ML) SYRINGE FOR IV PUSH (FOR BLOOD PRESSURE SUPPORT)
PREFILLED_SYRINGE | INTRAVENOUS | Status: DC | PRN
  Administered 2021-08-27: 80 ug via INTRAVENOUS
  Administered 2021-08-27: 120 ug via INTRAVENOUS

## 2021-08-27 MED ORDER — CHLORHEXIDINE GLUCONATE 0.12 % MT SOLN
OROMUCOSAL | Status: AC
  Administered 2021-08-27: 15 mL via OROMUCOSAL
  Filled 2021-08-27: qty 15

## 2021-08-27 MED ORDER — BUPIVACAINE LIPOSOME 1.3 % IJ SUSP
INTRAMUSCULAR | Status: AC
  Filled 2021-08-27: qty 20

## 2021-08-27 MED ORDER — PROPOFOL 10 MG/ML IV BOLUS
INTRAVENOUS | Status: DC | PRN
  Administered 2021-08-27: 100 mg via INTRAVENOUS

## 2021-08-27 MED ORDER — FENTANYL CITRATE (PF) 100 MCG/2ML IJ SOLN
INTRAMUSCULAR | Status: DC | PRN
  Administered 2021-08-27 (×2): 50 ug via INTRAVENOUS

## 2021-08-27 MED ORDER — ACETAMINOPHEN 500 MG PO TABS
ORAL_TABLET | ORAL | Status: AC
  Administered 2021-08-27: 1000 mg via ORAL
  Filled 2021-08-27: qty 2

## 2021-08-27 MED ORDER — CHLORHEXIDINE GLUCONATE 0.12 % MT SOLN: 15.0000 mL | Freq: Once | OROMUCOSAL | Status: AC

## 2021-08-27 MED ORDER — ORAL CARE MOUTH RINSE: 15.0000 mL | Freq: Once | OROMUCOSAL | Status: AC

## 2021-08-27 MED ORDER — LABETALOL HCL 5 MG/ML IV SOLN
INTRAVENOUS | Status: DC | PRN
  Administered 2021-08-27 (×2): 2.5 mg via INTRAVENOUS

## 2021-08-27 MED ORDER — ESMOLOL HCL 100 MG/10ML IV SOLN
INTRAVENOUS | Status: DC | PRN
  Administered 2021-08-27: 20 mg via INTRAVENOUS

## 2021-08-27 MED ORDER — LIDOCAINE HCL (CARDIAC) PF 100 MG/5ML IV SOSY
PREFILLED_SYRINGE | INTRAVENOUS | Status: DC | PRN
  Administered 2021-08-27: 100 mg via INTRAVENOUS

## 2021-08-27 MED ORDER — SODIUM CHLORIDE 0.9 % IV SOLN: INTRAVENOUS | Status: DC

## 2021-08-27 MED ORDER — SUGAMMADEX SODIUM 200 MG/2ML IV SOLN
INTRAVENOUS | Status: DC | PRN
  Administered 2021-08-27: 200 mg via INTRAVENOUS

## 2021-08-27 MED ORDER — ONDANSETRON HCL 4 MG/2ML IJ SOLN
INTRAMUSCULAR | Status: DC | PRN
  Administered 2021-08-27: 4 mg via INTRAVENOUS

## 2021-08-27 MED ORDER — BUPIVACAINE LIPOSOME 1.3 % IJ SUSP
INTRAMUSCULAR | Status: DC | PRN
  Administered 2021-08-27: 20 mL

## 2021-08-27 MED ORDER — ACETAMINOPHEN 500 MG PO TABS: 1000.0000 mg | ORAL_TABLET | ORAL | Status: AC

## 2021-08-27 MED ORDER — PHENYLEPHRINE HCL-NACL 20-0.9 MG/250ML-% IV SOLN
INTRAVENOUS | Status: DC | PRN
  Administered 2021-08-27: 40 ug/min via INTRAVENOUS

## 2021-08-27 MED ORDER — PROPOFOL 10 MG/ML IV BOLUS
INTRAVENOUS | Status: AC
  Filled 2021-08-27: qty 20

## 2021-08-27 MED ORDER — BUPIVACAINE-EPINEPHRINE (PF) 0.25% -1:200000 IJ SOLN
INTRAMUSCULAR | Status: AC
  Filled 2021-08-27: qty 30

## 2021-08-27 MED ORDER — FENTANYL CITRATE (PF) 100 MCG/2ML IJ SOLN
INTRAMUSCULAR | Status: AC
  Filled 2021-08-27: qty 2

## 2021-08-27 SURGICAL SUPPLY — 57 items
ADH SKN CLS APL DERMABOND .7 (GAUZE/BANDAGES/DRESSINGS) ×2
APL PRP STRL LF DISP 70% ISPRP (MISCELLANEOUS) ×2
CANNULA REDUC XI 12-8 STAPL (CANNULA) ×1
CANNULA REDUCER 12-8 DVNC XI (CANNULA) ×2 IMPLANT
CHLORAPREP W/TINT 26 (MISCELLANEOUS) ×3 IMPLANT
COVER TIP SHEARS 8 DVNC (MISCELLANEOUS) ×2 IMPLANT
COVER TIP SHEARS 8MM DA VINCI (MISCELLANEOUS) ×1
COVER WAND RF STERILE (DRAPES) ×3 IMPLANT
DEFOGGER SCOPE WARMER CLEARIFY (MISCELLANEOUS) ×3 IMPLANT
DERMABOND ADVANCED (GAUZE/BANDAGES/DRESSINGS) ×1
DERMABOND ADVANCED .7 DNX12 (GAUZE/BANDAGES/DRESSINGS) ×2 IMPLANT
DRAPE 3/4 80X56 (DRAPES) ×2 IMPLANT
DRAPE ARM DVNC X/XI (DISPOSABLE) ×6 IMPLANT
DRAPE COLUMN DVNC XI (DISPOSABLE) ×2 IMPLANT
DRAPE DA VINCI XI ARM (DISPOSABLE) ×3
DRAPE DA VINCI XI COLUMN (DISPOSABLE) ×1
ELECT CAUTERY BLADE 6.4 (BLADE) ×3 IMPLANT
ELECT REM PT RETURN 9FT ADLT (ELECTROSURGICAL) ×3
ELECTRODE REM PT RTRN 9FT ADLT (ELECTROSURGICAL) ×2 IMPLANT
GAUZE 4X4 16PLY ~~LOC~~+RFID DBL (SPONGE) ×2 IMPLANT
GLOVE SURG ENC MOIS LTX SZ7 (GLOVE) ×12 IMPLANT
GOWN STRL REUS W/ TWL LRG LVL3 (GOWN DISPOSABLE) ×8 IMPLANT
GOWN STRL REUS W/TWL LRG LVL3 (GOWN DISPOSABLE) ×12
IRRIGATION STRYKERFLOW (MISCELLANEOUS) ×2 IMPLANT
IRRIGATOR STRYKERFLOW (MISCELLANEOUS) ×3
IV NS 1000ML (IV SOLUTION)
IV NS 1000ML BAXH (IV SOLUTION) IMPLANT
KIT PINK PAD W/HEAD ARE REST (MISCELLANEOUS) ×3
KIT PINK PAD W/HEAD ARM REST (MISCELLANEOUS) ×2 IMPLANT
LABEL OR SOLS (LABEL) ×3 IMPLANT
MANIFOLD NEPTUNE II (INSTRUMENTS) ×3 IMPLANT
MESH 3DMAX 4X6 LT LRG (Mesh General) ×1 IMPLANT
MESH 3DMAX 4X6 RT LRG (Mesh General) ×1 IMPLANT
NEEDLE HYPO 22GX1.5 SAFETY (NEEDLE) ×3 IMPLANT
OBTURATOR OPTICAL STANDARD 8MM (TROCAR) ×1
OBTURATOR OPTICAL STND 8 DVNC (TROCAR) ×2
OBTURATOR OPTICALSTD 8 DVNC (TROCAR) ×2 IMPLANT
PACK LAP CHOLECYSTECTOMY (MISCELLANEOUS) ×3 IMPLANT
PENCIL ELECTRO HAND CTR (MISCELLANEOUS) ×3 IMPLANT
SEAL CANN UNIV 5-8 DVNC XI (MISCELLANEOUS) ×6 IMPLANT
SEAL XI 5MM-8MM UNIVERSAL (MISCELLANEOUS) ×3
SET TUBE SMOKE EVAC HIGH FLOW (TUBING) ×3 IMPLANT
SOLUTION ELECTROLUBE (MISCELLANEOUS) ×3 IMPLANT
SPONGE T-LAP 18X18 ~~LOC~~+RFID (SPONGE) ×3 IMPLANT
STAPLER CANNULA SEAL DVNC XI (STAPLE) ×2 IMPLANT
STAPLER CANNULA SEAL XI (STAPLE) ×1
SUT MNCRL AB 4-0 PS2 18 (SUTURE) ×3 IMPLANT
SUT V-LOC 90 ABS 3-0 VLT  V-20 (SUTURE) ×2
SUT V-LOC 90 ABS 3-0 VLT V-20 (SUTURE) ×4 IMPLANT
SUT VIC AB 2-0 SH 27 (SUTURE) ×6
SUT VIC AB 2-0 SH 27XBRD (SUTURE) ×4 IMPLANT
SUT VICRYL 0 AB UR-6 (SUTURE) ×6 IMPLANT
SYR 20ML LL LF (SYRINGE) ×3 IMPLANT
SYR 30ML LL (SYRINGE) ×3 IMPLANT
TAPE TRANSPORE STRL 2 31045 (GAUZE/BANDAGES/DRESSINGS) ×3 IMPLANT
TROCAR BALLN GELPORT 12X130M (ENDOMECHANICALS) ×3 IMPLANT
WATER STERILE IRR 500ML POUR (IV SOLUTION) ×2 IMPLANT

## 2021-08-27 NOTE — Discharge Instructions (Addendum)
Laparoscopic Inguinal Hernia Repair, Adult, Care After The following information offers guidance on how to care for yourself after your procedure. Your health care provider may also give you more specific instructions. If you have problems or questions, contact your health care provider. What can I expect after the procedure? After the procedure, it is common to have: Pain. Swelling and bruising around the incision area. Scrotal swelling, in males. Some fluid or blood draining from your incisions. Follow these instructions at home: Medicines Take over-the-counter and prescription medicines only as told by your health care provider. Ask your health care provider if the medicine prescribed to you: Requires you to avoid driving or using machinery. Can cause constipation. You may need to take these actions to prevent or treat constipation: Drink enough fluid to keep your urine pale yellow. Take over-the-counter or prescription medicines. Eat foods that are high in fiber, such as beans, whole grains, and fresh fruits and vegetables. Limit foods that are high in fat and processed sugars, such as fried or sweet foods. Incision care Follow instructions from your health care provider about how to take care of your incisions. Make sure you: Wash your hands with soap and water for at least 20 seconds before and after you change your bandage (dressing). If soap and water are not available, use hand sanitizer. Change your dressing as told by your health care provider. Leave stitches (sutures), skin glue, or adhesive strips in place. These skin closures may need to stay in place for 2 weeks or longer. If adhesive strip edges start to loosen and curl up, you may trim the loose edges. Do not remove adhesive strips completely unless your health care provider tells you to do that. Check your incision area every day for signs of infection. Check for: More redness, swelling, or pain. More fluid or  blood. Warmth. Pus or a bad smell. Wear loose, soft clothing while your incisions heal. Managing pain and swelling If directed, put ice on the painful or swollen areas. To do this: Put ice in a plastic bag. Place a towel between your skin and the bag. Leave the ice on for 20 minutes, 2-3 times a day. Remove the ice if your skin turns bright red. This is very important. If you cannot feel pain, heat, or cold, you have a greater risk of damage to the area.   Activity Do not lift anything that is heavier than 10 lb (4.5 kg), or the limit that you are told, until your health care provider says that it is safe. Ask your health care provider what activities are safe for you. A lot of activity during the first week after surgery can increase pain and swelling. For 1 week after your procedure: Avoid activities that take a lot of effort, such as exercise or sports. You may walk and climb stairs as needed for daily activity, but avoid long walks or climbing stairs for exercise. General instructions If you were given a sedative during the procedure, it can affect you for several hours. Do not drive or operate machinery until your health care provider says that it is safe. Do not take baths, swim, or use a hot tub until your health care provider approves. Ask your health care provider if you may take showers. You may only be allowed to take sponge baths. Do not use any products that contain nicotine or tobacco. These products include cigarettes, chewing tobacco, and vaping devices, such as e-cigarettes. If you need help quitting, ask your health  care provider. Keep all follow-up visits. This is important. Contact a health care provider if: You have any of these signs of infection: More redness, swelling, or pain around your incisions or your groin area. More fluid or blood coming from an incision. Warmth coming from an incision. Pus or a bad smell coming from an incision. A fever or chills. You have  more swelling in your scrotum, if you are male. You have severe pain and medicines do not help. You have abdominal pain or swelling. You cannot urinate or have a bowel movement. You faint or feel dizzy. You have nausea and vomiting. Get help right away if: You have redness, warmth, or pain in your leg. You have chest pain. You have problems breathing. These symptoms may represent a serious problem that is an emergency. Do not wait to see if the symptoms will go away. Get medical help right away. Call your local emergency services (911 in the [place] Do not drive yourself to the hospital. Summary Pain, swelling, and bruising are common after the procedure. Check your incision area every day for signs of infection, such as more redness, swelling, or pain. Put ice on painful or swollen areas for 20 minutes, 2-3 times a day. This information is not intended to replace advice given to you by your health care provider. Make sure you discuss any questions you have with your health care provider. Document Revised: 05/01/2020 Document Reviewed: 05/01/2020 Elsevier Patient Education  2022 [place]   The drugs that you were given will stay in your system until tomorrow so for the next 24 hours you should not:  Drive an automobile Make any legal decisions Drink any alcoholic beverage   You may resume regular meals tomorrow.  Today it is better to start with liquids and gradually work up to solid foods.  You may eat anything you prefer, but it is better to start with liquids, then soup and crackers, and gradually work up to solid foods.   Please notify your doctor immediately if you have any unusual bleeding, trouble breathing, redness and pain at the surgery site, drainage, fever, or pain not relieved by medication.    Additional Instructions: Please contact your physician with any problems or Same Day Surgery at [phone], Monday  through Friday 6 am to 4 pm, or [place] at [facility] number at [phone].

## 2021-08-27 NOTE — Op Note (Signed)
Robotic assisted Laparoscopic Transabdominal Bilateral Inguinal Hernia Repair with 3 D large Mesh ( BARD)      Pre-operative Diagnosis:  Right Inguinal Hernia   Post-operative Diagnosis: Bilateral Direct Inguinal hernias   Procedure: Robotic  Laparoscopic  repair of Bilateral direct inguinal hernias   Surgeon: Caroleen Hamman, MD FACS   Anesthesia: Gen. with endotracheal tube   Findings: Bilateral Direct inguinal hernias        Procedure Details  The patient was seen again in the Holding Room. The benefits, complications, treatment options, and expected outcomes were discussed with the patient. The risks of bleeding, infection, recurrence of symptoms, failure to resolve symptoms, recurrence of hernia, ischemic orchitis, chronic pain syndrome or neuroma, were discussed again. The likelihood of improving the patient's symptoms with return to their baseline status is good.  The patient and/or family concurred with the proposed plan, giving informed consent.  The patient was taken to Operating Room, identified  and the procedure verified as Laparoscopic Inguinal Hernia Repair. Laterality confirmed.  A Time Out was held and the above information confirmed.   Prior to the induction of general anesthesia, antibiotic prophylaxis was administered. VTE prophylaxis was in place. General endotracheal anesthesia was then administered and tolerated well. After the induction, the abdomen was prepped with Chloraprep and draped in the sterile fashion. The patient was positioned in the supine position.     Supraumbilical incision created and cut down technique used to enter the abdominal cavity. Fascia elevated and incised and hasson trochar placed. Pneumoperitoneum obtained w/o HD changes. No evidence of bowel injuries.  Two 8 mm placed under direct vision. The laparoscopy revealed large direct defects. I inserted the needles and the mesh. The robot was brought ot the table and docked in the standard fashion, no  collision between arms was observed. Instruments were kept under direct view at all times. We started on the right side were a flap was created. The sac was reduced and dissected free from adjacent structures. We preserved the vas and the vessels. Once dissection was completed a large 3D mesh was placed and secured with two interrupted vicryl attached to the pubic tubercle. There was good coverage of the direct, indirect and femoral spaces. The flap was closed with v lock suture.   Attention then was turned to the left side were a flap was created. The sac was reduced and dissected free from adjacent structures. We preserved the vas and the vessels. Once dissection was completed a large 3D mesh was placed and secured with two interrupted vicryl attached to the pubic tubercle. There was good coverage of the direct, indirect and femoral spaces. The flap was closed with v lock suture. Second look revealed no complications or injuries.    Once assuring that hemostasis was adequate the ports were removed and a figure-of-eight 0 Vicryl suture was placed at the fascial edges. 4-0 subcuticular Monocryl was used at all skin edges. Dermabond was placed.  Patient tolerated the procedure well. There were no complications. He was taken to the recovery room in stable condition.                 Caroleen Hamman, MD, FACS

## 2021-08-27 NOTE — Anesthesia Preprocedure Evaluation (Signed)
Anesthesia Evaluation  Patient identified by MRN, date of birth, ID band Patient awake    Reviewed: Allergy & Precautions, H&P , NPO status , Patient's Chart, lab work & pertinent test results, reviewed documented beta blocker date and time   Airway Mallampati: II  TM Distance: >3 FB Neck ROM: full    Dental  (+) Teeth Intact   Pulmonary neg pulmonary ROS,    Pulmonary exam normal        Cardiovascular Exercise Tolerance: Good hypertension, On Medications negative cardio ROS Normal cardiovascular exam Rhythm:regular Rate:Normal     Neuro/Psych negative neurological ROS  negative psych ROS   GI/Hepatic negative GI ROS, Neg liver ROS,   Endo/Other  diabetes, Well Controlled, Type 2, Oral Hypoglycemic AgentsHypothyroidism   Renal/GU CRFRenal disease  negative genitourinary   Musculoskeletal   Abdominal   Peds  Hematology negative hematology ROS (+)   Anesthesia Other Findings Past Medical History: No date: Diabetes mellitus without complication (HCC) No date: Hypertension No date: Thyroid disease History reviewed. No pertinent surgical history.   Reproductive/Obstetrics negative OB ROS                             Anesthesia Physical Anesthesia Plan  ASA: 3  Anesthesia Plan: General ETT   Post-op Pain Management:    Induction:   PONV Risk Score and Plan: 3  Airway Management Planned:   Additional Equipment:   Intra-op Plan:   Post-operative Plan:   Informed Consent: I have reviewed the patients History and Physical, chart, labs and discussed the procedure including the risks, benefits and alternatives for the proposed anesthesia with the patient or authorized representative who has indicated his/her understanding and acceptance.     Dental Advisory Given  Plan Discussed with: CRNA  Anesthesia Plan Comments:         Anesthesia Quick Evaluation

## 2021-08-27 NOTE — Anesthesia Procedure Notes (Signed)
Procedure Name: Intubation Date/Time: 08/27/2021 12:41 PM Performed by: Lily Peer, Zsofia Prout, CRNA Pre-anesthesia Checklist: Patient identified, Emergency Drugs available, Suction available and Patient being monitored Patient Re-evaluated:Patient Re-evaluated prior to induction Oxygen Delivery Method: Circle system utilized Preoxygenation: Pre-oxygenation with 100% oxygen Induction Type: IV induction Ventilation: Mask ventilation without difficulty Laryngoscope Size: McGraph and 4 Grade View: Grade I Tube type: Oral Number of attempts: 1 Airway Equipment and Method: Stylet Placement Confirmation: ETT inserted through vocal cords under direct vision, positive ETCO2 and breath sounds checked- equal and bilateral Secured at: 23 cm Tube secured with: Tape Dental Injury: Teeth and Oropharynx as per pre-operative assessment

## 2021-08-27 NOTE — Interval H&P Note (Signed)
History and Physical Interval Note:  08/27/2021 11:46 AM  Adam Castillo  has presented today for surgery, with the diagnosis of inguinal hernia.  The various methods of treatment have been discussed with the patient and family. After consideration of risks, benefits and other options for treatment, the patient has consented to  Procedure(s): XI ROBOTIC ASSISTED INGUINAL HERNIA, possible open, possible bilateral (Left) as a surgical intervention.  The patient's history has been reviewed, patient examined, no change in status, stable for surgery.  I have reviewed the patient's chart and labs.  Questions were answered to the patient's satisfaction.     Jolly

## 2021-08-27 NOTE — Transfer of Care (Signed)
Immediate Anesthesia Transfer of Care Note  Patient: Adam Castillo  Procedure(s) Performed: XI ROBOTIC ASSISTED INGUINAL HERNIA, possible open, possible bilateral (Bilateral) INSERTION OF MESH  Patient Location: PACU  Anesthesia Type:General  Level of Consciousness: awake, alert  and oriented  Airway & Oxygen Therapy: Patient Spontanous Breathing and Patient connected to face mask oxygen  Post-op Assessment: Report given to RN and Post -op Vital signs reviewed and stable  Post vital signs: Reviewed and stable  Last Vitals:  Vitals Value Taken Time  BP 165/89 08/27/21 1358  Temp    Pulse 61 08/27/21 1401  Resp 16 08/27/21 1401  SpO2 100 % 08/27/21 1401  Vitals shown include unvalidated device data.  Last Pain:  Vitals:   08/27/21 0852  TempSrc: Oral  PainSc: 0-No pain         Complications: No notable events documented.

## 2021-08-28 ENCOUNTER — Encounter: Payer: Self-pay | Admitting: Surgery

## 2021-08-29 NOTE — Anesthesia Postprocedure Evaluation (Signed)
Anesthesia Post Note  Patient: Adam Castillo January  Procedure(s) Performed: XI ROBOTIC ASSISTED INGUINAL HERNIA, possible open, possible bilateral (Bilateral) INSERTION OF MESH  Patient location during evaluation: PACU Anesthesia Type: General Level of consciousness: awake and alert Pain management: pain level controlled Vital Signs Assessment: post-procedure vital signs reviewed and stable Respiratory status: spontaneous breathing, nonlabored ventilation, respiratory function stable and patient connected to nasal cannula oxygen Cardiovascular status: blood pressure returned to baseline and stable Postop Assessment: no apparent nausea or vomiting Anesthetic complications: no   No notable events documented.   Last Vitals:  Vitals:   08/27/21 1456 08/27/21 1542  BP: (!) 156/77 (!) 150/78  Pulse: 63 69  Resp: 18 18  Temp: (!) 36.2 C   SpO2: 99% 99%    Last Pain:  Vitals:   08/27/21 1542  TempSrc:   PainSc: 0-No pain                 Molli Barrows

## 2021-09-11 ENCOUNTER — Telehealth: Payer: Self-pay

## 2021-09-11 NOTE — Telephone Encounter (Signed)
Medical clearance received from [facility] cleared for surgery-low risk-patient is optimized for surgery.

## 2021-09-17 ENCOUNTER — Other Ambulatory Visit: Payer: Self-pay

## 2021-09-17 ENCOUNTER — Encounter: Payer: Self-pay | Admitting: Physician Assistant

## 2021-09-17 ENCOUNTER — Ambulatory Visit (INDEPENDENT_AMBULATORY_CARE_PROVIDER_SITE_OTHER): Payer: Medicare Other | Admitting: Physician Assistant

## 2021-09-17 VITALS — BP 168/89 | HR 80 | Temp 98.3°F | Ht 69.0 in | Wt 196.2 lb

## 2021-09-17 DIAGNOSIS — K4090 Unilateral inguinal hernia, without obstruction or gangrene, not specified as recurrent: Secondary | ICD-10-CM

## 2021-09-17 DIAGNOSIS — Z09 Encounter for follow-up examination after completed treatment for conditions other than malignant neoplasm: Secondary | ICD-10-CM

## 2021-09-17 NOTE — Patient Instructions (Addendum)
You may try Miralax over the counter to help with constipation. Start with once a day and if it is not helping, you may take twice daily.  If you have any concerns or questions, please feel free to call our office.   GENERAL POST-OPERATIVE PATIENT INSTRUCTIONS   WOUND CARE INSTRUCTIONS:  Keep a dry clean dressing on the wound if there is drainage. The initial bandage may be removed after 24 hours.  Once the wound has quit draining you may leave it open to air.  If clothing rubs against the wound or causes irritation and the wound is not draining you may cover it with a dry dressing during the daytime.  Try to keep the wound dry and avoid ointments on the wound unless directed to do so.  If the wound becomes bright red and painful or starts to drain infected material that is not clear, please contact your physician immediately.  If the wound is mildly pink and has a thick firm ridge underneath it, this is normal, and is referred to as a healing ridge.  This will resolve over the next 4-6 weeks.  BATHING: You may shower if you have been informed of this by your surgeon. However, Please do not submerge in a tub, hot tub, or pool until incisions are completely sealed or have been told by your surgeon that you may do so.  DIET:  You may eat any foods that you can tolerate.  It is a good idea to eat a high fiber diet and take in plenty of fluids to prevent constipation.  If you do become constipated you may want to take a mild laxative or take ducolax tablets on a daily basis until your bowel habits are regular.  Constipation can be very uncomfortable, along with straining, after recent surgery.  ACTIVITY:  You are encouraged to cough and deep breath or use your incentive spirometer if you were given one, every 15-30 minutes when awake.  This will help prevent respiratory complications and low grade fevers post-operatively if you had a general anesthetic.  You may want to hug a pillow when coughing and  sneezing to add additional support to the surgical area, if you had abdominal or chest surgery, which will decrease pain during these times.  You are encouraged to walk and engage in light activity for the next two weeks.  You should not lift more than 10-15 pounds, until 10/08/2021  as it could put you at increased risk for complications.  Twenty pounds is roughly equivalent to a plastic bag of groceries. At that time- Listen to your body when lifting, if you have pain when lifting, stop and then try again in a few days. Soreness after doing exercises or activities of daily living is normal as you get back in to your normal routine.  MEDICATIONS:  Try to take narcotic medications and anti-inflammatory medications, such as tylenol, ibuprofen, naprosyn, etc., with food.  This will minimize stomach upset from the medication.  Should you develop nausea and vomiting from the pain medication, or develop a rash, please discontinue the medication and contact your physician.  You should not drive, make important decisions, or operate machinery when taking narcotic pain medication.  SUNBLOCK Use sun block to incision area over the next year if this area will be exposed to sun. This helps decrease scarring and will allow you avoid a permanent darkened area over your incision.  QUESTIONS:  Please feel free to call our office if you have any  questions, and we will be glad to assist you.    Laparoscopic Inguinal Hernia Repair, Adult, Care After  The following information offers guidance on how to care for yourself after your procedure. Your health care provider may also give you more specific instructions. If you have problems or questions, contact your health care provider. What can I expect after the procedure? After the procedure, it is common to have: Pain. Swelling and bruising around the incision area. Scrotal swelling, in males. Some fluid or blood draining from your incisions. Follow these instructions  at home: Medicines Take over-the-counter and prescription medicines only as told by your health care provider. Ask your health care provider if the medicine prescribed to you: Requires you to avoid driving or using machinery. Can cause constipation. You may need to take these actions to prevent or treat constipation: Drink enough fluid to keep your urine pale yellow. Take over-the-counter or prescription medicines. Eat foods that are high in fiber, such as beans, whole grains, and fresh fruits and vegetables. Limit foods that are high in fat and processed sugars, such as fried or sweet foods. Incision care  Follow instructions from your health care provider about how to take care of your incisions. Make sure you: Wash your hands with soap and water for at least 20 seconds before and after you change your bandage (dressing). If soap and water are not available, use hand sanitizer. Change your dressing as told by your health care provider. Leave stitches (sutures), skin glue, or adhesive strips in place. These skin closures may need to stay in place for 2 weeks or longer. If adhesive strip edges start to loosen and curl up, you may trim the loose edges. Do not remove adhesive strips completely unless your health care provider tells you to do that. Check your incision area every day for signs of infection. Check for: More redness, swelling, or pain. More fluid or blood. Warmth. Pus or a bad smell. Wear loose, soft clothing while your incisions heal. Managing pain and swelling If directed, put ice on the painful or swollen areas. To do this: Put ice in a plastic bag. Place a towel between your skin and the bag. Leave the ice on for 20 minutes, 2-3 times a day. Remove the ice if your skin turns bright red. This is very important. If you cannot feel pain, heat, or cold, you have a greater risk of damage to the area.  Activity Do not lift anything that is heavier than 10 lb (4.5 kg), or the  limit that you are told, until your health care provider says that it is safe. Ask your health care provider what activities are safe for you. A lot of activity during the first week after surgery can increase pain and swelling. For 1 week after your procedure: Avoid activities that take a lot of effort, such as exercise or sports. You may walk and climb stairs as needed for daily activity, but avoid long walks or climbing stairs for exercise. General instructions If you were given a sedative during the procedure, it can affect you for several hours. Do not drive or operate machinery until your health care provider says that it is safe. Do not take baths, swim, or use a hot tub until your health care provider approves. Ask your health care provider if you may take showers. You may only be allowed to take sponge baths. Do not use any products that contain nicotine or tobacco. These products include cigarettes, chewing tobacco,  and vaping devices, such as e-cigarettes. If you need help quitting, ask your health care provider. Keep all follow-up visits. This is important. Contact a health care provider if: You have any of these signs of infection: More redness, swelling, or pain around your incisions or your groin area. More fluid or blood coming from an incision. Warmth coming from an incision. Pus or a bad smell coming from an incision. A fever or chills. You have more swelling in your scrotum, if you are male. You have severe pain and medicines do not help. You have abdominal pain or swelling. You cannot urinate or have a bowel movement. You faint or feel dizzy. You have nausea and vomiting. Get help right away if: You have redness, warmth, or pain in your leg. You have chest pain. You have problems breathing. These symptoms may represent a serious problem that is an emergency. Do not wait to see if the symptoms will go away. Get medical help right away. Call your local emergency services  (911 in the [place] Do not drive yourself to the hospital. Summary Pain, swelling, and bruising are common after the procedure. Check your incision area every day for signs of infection, such as more redness, swelling, or pain. Put ice on painful or swollen areas for 20 minutes, 2-3 times a day. This information is not intended to replace advice given to you by your health care provider. Make sure you discuss any questions you have with your health care provider. Document Revised: 05/01/2020 Document Reviewed: 05/01/2020 Elsevier Patient Education  2022 [employer].

## 2021-09-17 NOTE — Progress Notes (Signed)
[facility] POST-OP OFFICE VISIT  09/17/2021  HPI: Adam Castillo is a 76 y.o. male 21 days s/p robotic laparoscopic repair of bilateral direct inguinal hernias with Dr Dahlia Byes  He is doing well He still is having inguinal soreness bilaterally. Not needing any medications. He only needed narcotic pain medications for about 3 days.  No fever, chills, nausea, emesis He is having some degree of constipation; not taking anything No issues with PO intake No issues with incisions  Vital signs: BP (!) 168/89    Pulse 80    Temp 98.3 F (36.8 C) (Oral)    Ht 5\' 9"  (1.753 m)    Wt 196 lb 3.2 oz (89 kg)    SpO2 96%    BMI 28.97 kg/m    Physical Exam: Constitutional: Well appearing male, NAD Abdomen: Soft, non-tender, non-distended, no rebound/guarding. Expected induration in the bilateral inguinal region consistent with hernia repair, no evidence of recurrence Skin: Laparoscopic incisions are healing well, no erythema or drainage   Assessment/Plan: This is a 76 y.o. male 21 days s/p robotic laparoscopic repair of bilateral direct inguinal hernias    - Pain control prn  - Miralax for constipation prn  - Reviewed lifting restrictions; 6 weeks total  - He can rtc on as needed basis   -- Edison Simon, PA-C [facility] 09/17/2021, 1:37 PM [phone] M-F: 7am - 4pm

## 2021-09-23 ENCOUNTER — Other Ambulatory Visit: Payer: Self-pay | Admitting: *Deleted

## 2021-09-23 DIAGNOSIS — E039 Hypothyroidism, unspecified: Secondary | ICD-10-CM

## 2021-09-23 MED ORDER — SIMVASTATIN 20 MG PO TABS: 20.0000 mg | ORAL_TABLET | Freq: Every day | ORAL | 3 refills | Status: DC

## 2021-10-01 ENCOUNTER — Encounter (INDEPENDENT_AMBULATORY_CARE_PROVIDER_SITE_OTHER): Payer: Medicare Other | Admitting: Gastroenterology

## 2021-10-01 ENCOUNTER — Encounter: Payer: Self-pay | Admitting: Gastroenterology

## 2021-10-01 ENCOUNTER — Other Ambulatory Visit: Payer: Self-pay

## 2021-10-01 DIAGNOSIS — Z1211 Encounter for screening for malignant neoplasm of colon: Secondary | ICD-10-CM

## 2021-10-01 NOTE — Progress Notes (Signed)
error 

## 2021-10-15 ENCOUNTER — Ambulatory Visit: Payer: Medicare Other | Admitting: Anesthesiology

## 2021-10-15 ENCOUNTER — Encounter: Payer: Self-pay | Admitting: Gastroenterology

## 2021-10-15 ENCOUNTER — Encounter
Admission: RE | Disposition: A | Discharge status: Home / Self Care | Payer: Self-pay | Source: Home / Self Care | Attending: Gastroenterology

## 2021-10-15 ENCOUNTER — Ambulatory Visit
Admission: RE | Admit: 2021-10-15 | Discharge: 2021-10-15 | Disposition: A | Discharge status: Home / Self Care | Payer: Medicare Other | Attending: Gastroenterology | Admitting: Gastroenterology

## 2021-10-15 DIAGNOSIS — K219 Gastro-esophageal reflux disease without esophagitis: Secondary | ICD-10-CM | POA: Diagnosis not present

## 2021-10-15 DIAGNOSIS — I10 Essential (primary) hypertension: Secondary | ICD-10-CM | POA: Diagnosis not present

## 2021-10-15 DIAGNOSIS — E785 Hyperlipidemia, unspecified: Secondary | ICD-10-CM | POA: Diagnosis not present

## 2021-10-15 DIAGNOSIS — K635 Polyp of colon: Secondary | ICD-10-CM

## 2021-10-15 DIAGNOSIS — E039 Hypothyroidism, unspecified: Secondary | ICD-10-CM | POA: Diagnosis not present

## 2021-10-15 DIAGNOSIS — D126 Benign neoplasm of colon, unspecified: Secondary | ICD-10-CM | POA: Diagnosis not present

## 2021-10-15 DIAGNOSIS — K640 First degree hemorrhoids: Secondary | ICD-10-CM | POA: Insufficient documentation

## 2021-10-15 DIAGNOSIS — Z1211 Encounter for screening for malignant neoplasm of colon: Secondary | ICD-10-CM | POA: Diagnosis not present

## 2021-10-15 DIAGNOSIS — E119 Type 2 diabetes mellitus without complications: Secondary | ICD-10-CM | POA: Diagnosis not present

## 2021-10-15 HISTORY — PX: COLONOSCOPY WITH PROPOFOL: SHX5780

## 2021-10-15 LAB — GLUCOSE, CAPILLARY: Glucose-Capillary: 135 mg/dL — ABNORMAL HIGH (ref 70–99)

## 2021-10-15 SURGERY — COLONOSCOPY WITH PROPOFOL
Anesthesia: General

## 2021-10-15 MED ORDER — LIDOCAINE HCL (PF) 2 % IJ SOLN
INTRAMUSCULAR | Status: AC
  Filled 2021-10-15: qty 5

## 2021-10-15 MED ORDER — LIDOCAINE 2% (20 MG/ML) 5 ML SYRINGE
INTRAMUSCULAR | Status: DC | PRN
  Administered 2021-10-15: 50 mg via INTRAVENOUS

## 2021-10-15 MED ORDER — PROPOFOL 10 MG/ML IV BOLUS
INTRAVENOUS | Status: DC | PRN
Start: 2021-10-15 — End: 2021-10-15
  Administered 2021-10-15: 80 mg via INTRAVENOUS

## 2021-10-15 MED ORDER — PROPOFOL 500 MG/50ML IV EMUL
INTRAVENOUS | Status: AC
  Filled 2021-10-15: qty 50

## 2021-10-15 MED ORDER — PROPOFOL 500 MG/50ML IV EMUL
INTRAVENOUS | Status: DC | PRN
  Administered 2021-10-15: 200 ug/kg/min via INTRAVENOUS

## 2021-10-15 MED ORDER — SODIUM CHLORIDE 0.9 % IV SOLN: INTRAVENOUS | Status: DC

## 2021-10-15 NOTE — Anesthesia Preprocedure Evaluation (Signed)
Anesthesia Evaluation  Patient identified by MRN, date of birth, ID band Patient awake    Reviewed: Allergy & Precautions, NPO status , Patient's Chart, lab work & pertinent test results  History of Anesthesia Complications Negative for: history of anesthetic complications  Airway Mallampati: III  TM Distance: >3 FB Neck ROM: full    Dental  (+) Chipped, Poor Dentition, Missing   Pulmonary neg pulmonary ROS, neg shortness of breath,    Pulmonary exam normal        Cardiovascular Exercise Tolerance: Good hypertension, (-) anginaNormal cardiovascular exam     Neuro/Psych negative neurological ROS  negative psych ROS   GI/Hepatic negative GI ROS, Neg liver ROS, neg GERD  ,  Endo/Other  diabetes, Type 2Hypothyroidism   Renal/GU Renal disease  negative genitourinary   Musculoskeletal   Abdominal   Peds  Hematology negative hematology ROS (+)   Anesthesia Other Findings Past Medical History: No date: Diabetes mellitus without complication (HCC) No date: Hypertension No date: Thyroid disease  Past Surgical History: No date: COLONOSCOPY WITH PROPOFOL No date: HERNIA REPAIR 08/27/2021: INSERTION OF MESH     Comment:  Procedure: INSERTION OF MESH;  Surgeon: Jules Husbands,               MD;  Location: [facility] ORS;  Service: General;;  BMI    Body Mass Index: 28.80 kg/m      Reproductive/Obstetrics negative OB ROS                             Anesthesia Physical Anesthesia Plan  ASA: 3  Anesthesia Plan: General   Post-op Pain Management:    Induction: Intravenous  PONV Risk Score and Plan: Propofol infusion and TIVA  Airway Management Planned: Natural Airway and Nasal Cannula  Additional Equipment:   Intra-op Plan:   Post-operative Plan:   Informed Consent: I have reviewed the patients History and Physical, chart, labs and discussed the procedure including the risks, benefits  and alternatives for the proposed anesthesia with the patient or authorized representative who has indicated his/her understanding and acceptance.     Dental Advisory Given  Plan Discussed with: Anesthesiologist, CRNA and Surgeon  Anesthesia Plan Comments: (Patient consented for risks of anesthesia including but not limited to:  - adverse reactions to medications - risk of airway placement if required - damage to eyes, teeth, lips or other oral mucosa - nerve damage due to positioning  - sore throat or hoarseness - Damage to heart, brain, nerves, lungs, other parts of body or loss of life  Patient voiced understanding.)        Anesthesia Quick Evaluation

## 2021-10-15 NOTE — Op Note (Signed)
[facility] Gastroenterology Patient Name: Adam Castillo Procedure Date: 10/15/2021 10:24 AM MRN: [id number] Account #: [id number] Date of Birth: April 21, 1946 Admit Type: Outpatient Age: 76 Room: [facility] ENDO ROOM 2 Gender: Male Note Status: Finalized Instrument Name: Park Meo [id number] Procedure:             Colonoscopy Indications:           Screening for colorectal malignant neoplasm Providers:             Jonathon Bellows MD, MD Referring MD:          Cletis Athens, MD (Referring MD) Medicines:             Monitored Anesthesia Care Complications:         No immediate complications. Procedure:             Pre-Anesthesia Assessment:                        - Prior to the procedure, a History and Physical was                         performed, and patient medications, allergies and                         sensitivities were reviewed. The patient's tolerance                         of previous anesthesia was reviewed.                        - The risks and benefits of the procedure and the                         sedation options and risks were discussed with the                         patient. All questions were answered and informed                         consent was obtained.                        - After reviewing the risks and benefits, the patient                         was deemed in satisfactory condition to undergo the                         procedure.                        - ASA Grade Assessment: II - A patient with mild                         systemic disease.                        After obtaining informed consent, the colonoscope was                         passed under direct vision. Throughout  the procedure,                         the patient's blood pressure, pulse, and oxygen                         saturations were monitored continuously. The                         Colonoscope was introduced through the anus and                         advanced to the  the cecum, identified by the                         appendiceal orifice. The colonoscopy was performed                         with ease. The patient tolerated the procedure well.                         The quality of the bowel preparation was good. Findings:      The perianal and digital rectal examinations were normal.      Two sessile polyps were found in the sigmoid colon. The polyps were 4 to       5 mm in size. These polyps were removed with a cold snare. Resection and       retrieval were complete.      Non-bleeding internal hemorrhoids were found during retroflexion. The       hemorrhoids were large and Grade I (internal hemorrhoids that do not       prolapse).      The exam was otherwise without abnormality on direct and retroflexion       views. Impression:            - Two 4 to 5 mm polyps in the sigmoid colon, removed                         with a cold snare. Resected and retrieved.                        - Non-bleeding internal hemorrhoids.                        - The examination was otherwise normal on direct and                         retroflexion views. Recommendation:        - Discharge patient to home (with escort).                        - Resume previous diet.                        - Continue present medications.                        - Await pathology results.                        -  Repeat colonoscopy is not recommended due to current                         age (46 years or older) for surveillance. Procedure Code(s):     --- Professional ---                        [phone], Colonoscopy, flexible; with removal of                         tumor(s), polyp(s), or other lesion(s) by snare                         technique Diagnosis Code(s):     --- Professional ---                        Z12.11, Encounter for screening for malignant neoplasm                         of colon                        K63.5, Polyp of colon                        K64.0, First degree  hemorrhoids CPT copyright 2019 American Medical Association. All rights reserved. The codes documented in this report are preliminary and upon coder review may  be revised to meet current compliance requirements. Jonathon Bellows, MD Jonathon Bellows MD, MD 10/15/2021 11:34:19 AM This report has been signed electronically. Number of Addenda: 0 Note Initiated On: 10/15/2021 10:24 AM Scope Withdrawal Time: 0 hours 12 minutes 10 seconds  Total Procedure Duration: 0 hours 13 minutes 22 seconds  Estimated Blood Loss:  Estimated blood loss: none.      [facility]

## 2021-10-15 NOTE — Anesthesia Postprocedure Evaluation (Signed)
Anesthesia Post Note  Patient: Calton Larose  Procedure(s) Performed: COLONOSCOPY WITH PROPOFOL  Patient location during evaluation: Endoscopy Anesthesia Type: General Level of consciousness: awake and alert Pain management: pain level controlled Vital Signs Assessment: post-procedure vital signs reviewed and stable Respiratory status: spontaneous breathing, nonlabored ventilation, respiratory function stable and patient connected to nasal cannula oxygen Cardiovascular status: blood pressure returned to baseline and stable Postop Assessment: no apparent nausea or vomiting Anesthetic complications: no   No notable events documented.   Last Vitals:  Vitals:   10/15/21 1148 10/15/21 1207  BP:    Pulse:    Resp:    Temp: (!) 36.1 C (!) 36 C  SpO2:      Last Pain:  Vitals:   10/15/21 1207  TempSrc: Temporal  PainSc:                  Precious Haws Emmamae Mcnamara

## 2021-10-15 NOTE — Transfer of Care (Signed)
Immediate Anesthesia Transfer of Care Note  Patient: Adam Castillo  Procedure(s) Performed: COLONOSCOPY WITH PROPOFOL  Patient Location: Endoscopy Unit  Anesthesia Type:General  Level of Consciousness: awake, alert  and oriented  Airway & Oxygen Therapy: Patient Spontanous Breathing  Post-op Assessment: Report given to RN and Post -op Vital signs reviewed and stable  Post vital signs: Reviewed and stable  Last Vitals:  Vitals Value Taken Time  BP 104/65   Temp    Pulse 66 10/15/21 1134  Resp 17 10/15/21 1134  SpO2 100 % 10/15/21 1134  Vitals shown include unvalidated device data.  Last Pain:  Vitals:   10/15/21 1012  TempSrc: Temporal  PainSc: 0-No pain         Complications: No notable events documented.

## 2021-10-15 NOTE — H&P (Signed)
Adam Bellows, MD [address], [place], [place], [place], [address] [address], [place], [place], [place], [address] Phone: [phone]  Fax: [phone]  Primary Care Physician:  Cletis Athens, MD   Pre-Procedure History & Physical: HPI:  Adam Castillo is a 76 y.o. male is here for an colonoscopy.   Past Medical History:  Diagnosis Date   Diabetes mellitus without complication ([place])    Hypertension    Thyroid disease     Past Surgical History:  Procedure Laterality Date   COLONOSCOPY WITH PROPOFOL     HERNIA REPAIR     INSERTION OF MESH  08/27/2021   Procedure: INSERTION OF MESH;  Surgeon: Jules Husbands, MD;  Location: [facility] ORS;  Service: General;;    Prior to Admission medications   Medication Sig Start Date End Date Taking? Authorizing Provider  aspirin EC 81 MG tablet Take 81 mg by mouth daily.   Yes [provider]  levothyroxine (SYNTHROID) 50 MCG tablet TAKE 1 TABLET BY MOUTH EVERY DAY 03/27/21  Yes Masoud, Viann Shove, MD  lisinopril (ZESTRIL) 40 MG tablet TAKE 1 TABLET BY MOUTH EVERY DAY 06/25/21  Yes Masoud, Viann Shove, MD  metFORMIN (GLUCOPHAGE) 500 MG tablet Take 1 tablet (500 mg total) by mouth 2 (two) times daily with a meal. 02/18/21  Yes Masoud, Viann Shove, MD  simvastatin (ZOCOR) 20 MG tablet Take 1 tablet (20 mg total) by mouth daily. 09/23/21  Yes Masoud, Viann Shove, MD  Alpha-Lipoic Acid 100 MG CAPS Take 1 capsule (100 mg total) by mouth daily. 02/07/20   Cletis Athens, MD  cholecalciferol (VITAMIN D3) 25 MCG (1000 UNIT) tablet Take 1,000 Units by mouth daily.    [provider]  Multiple Vitamins-Minerals (CENTRUM SILVER 50+MEN PO) Take 1 tablet by mouth daily.    [provider]    Allergies as of 10/02/2021   (No Known Allergies)    Family History  Problem Relation Age of Onset   COPD Mother    Heart disease Father     Social History   Socioeconomic History   Marital status: Single    Spouse name: Not on file   Number of  children: Not on file   Years of education: Not on file   Highest education level: Not on file  Occupational History   Not on file  Tobacco Use   Smoking status: Never   Smokeless tobacco: Never  Vaping Use   Vaping Use: Never used  Substance and Sexual Activity   Alcohol use: Never   Drug use: Never   Sexual activity: Not on file  Other Topics Concern   Not on file  Social History Narrative   Not on file   Social Determinants of Health   Financial Resource Strain: Not on file  Food Insecurity: Not on file  Transportation Needs: Not on file  Physical Activity: Not on file  Stress: Not on file  Social Connections: Not on file  Intimate Partner Violence: Not on file    Review of Systems: See HPI, otherwise negative ROS  Physical Exam: BP (!) 162/94    Pulse 77    Temp (!) 97.3 F (36.3 C) (Temporal)    Resp 18    Ht 5\' 9"  (1.753 m)    Wt 88.5 kg    SpO2 96%    BMI 28.80 kg/m  General:   Alert,  pleasant and cooperative in NAD Head:  Normocephalic and atraumatic. Neck:  Supple; no masses or thyromegaly. Lungs:  Clear throughout to auscultation, normal respiratory effort.    Heart:  +S1, +S2, Regular rate and rhythm, No edema. Abdomen:  Soft, nontender and nondistended. Normal bowel sounds, without guarding, and without rebound.   Neurologic:  Alert and  oriented x4;  grossly normal neurologically.  Impression/Plan: [name] is here for an colonoscopy to be performed for Screening colonoscopy average risk   Risks, benefits, limitations, and alternatives regarding  colonoscopy have been reviewed with the patient.  Questions have been answered.  All parties agreeable.   Adam Bellows, MD  10/15/2021, 10:21 AM

## 2021-10-16 ENCOUNTER — Encounter: Payer: Self-pay | Admitting: Gastroenterology

## 2021-10-16 LAB — SURGICAL PATHOLOGY

## 2021-11-05 ENCOUNTER — Ambulatory Visit (INDEPENDENT_AMBULATORY_CARE_PROVIDER_SITE_OTHER): Payer: Medicare Other | Admitting: Internal Medicine

## 2021-11-05 ENCOUNTER — Other Ambulatory Visit: Payer: Self-pay

## 2021-11-05 VITALS — BP 139/64 | HR 88 | Ht 69.0 in | Wt 198.1 lb

## 2021-11-05 DIAGNOSIS — E063 Autoimmune thyroiditis: Secondary | ICD-10-CM

## 2021-11-05 DIAGNOSIS — N1831 Chronic kidney disease, stage 3a: Secondary | ICD-10-CM | POA: Diagnosis not present

## 2021-11-05 DIAGNOSIS — Z794 Long term (current) use of insulin: Secondary | ICD-10-CM

## 2021-11-05 DIAGNOSIS — E038 Other specified hypothyroidism: Secondary | ICD-10-CM

## 2021-11-05 DIAGNOSIS — I10 Essential (primary) hypertension: Secondary | ICD-10-CM

## 2021-11-05 DIAGNOSIS — E785 Hyperlipidemia, unspecified: Secondary | ICD-10-CM | POA: Diagnosis not present

## 2021-11-05 DIAGNOSIS — E11610 Type 2 diabetes mellitus with diabetic neuropathic arthropathy: Secondary | ICD-10-CM | POA: Diagnosis not present

## 2021-11-05 DIAGNOSIS — M1711 Unilateral primary osteoarthritis, right knee: Secondary | ICD-10-CM | POA: Diagnosis not present

## 2021-11-05 LAB — POCT GLYCOSYLATED HEMOGLOBIN (HGB A1C): Hemoglobin A1C: 5.8 % — AB (ref 4.0–5.6)

## 2021-11-05 LAB — GLUCOSE, POCT (MANUAL RESULT ENTRY): POC Glucose: 146 mg/dl — AB (ref 70–99)

## 2021-11-06 ENCOUNTER — Ambulatory Visit (INDEPENDENT_AMBULATORY_CARE_PROVIDER_SITE_OTHER): Payer: Medicare Other | Admitting: *Deleted

## 2021-11-06 DIAGNOSIS — E063 Autoimmune thyroiditis: Secondary | ICD-10-CM | POA: Diagnosis not present

## 2021-11-06 DIAGNOSIS — I10 Essential (primary) hypertension: Secondary | ICD-10-CM | POA: Diagnosis not present

## 2021-11-06 DIAGNOSIS — K4090 Unilateral inguinal hernia, without obstruction or gangrene, not specified as recurrent: Secondary | ICD-10-CM

## 2021-11-06 DIAGNOSIS — N1831 Chronic kidney disease, stage 3a: Secondary | ICD-10-CM

## 2021-11-06 DIAGNOSIS — Z125 Encounter for screening for malignant neoplasm of prostate: Secondary | ICD-10-CM

## 2021-11-06 DIAGNOSIS — E785 Hyperlipidemia, unspecified: Secondary | ICD-10-CM

## 2021-11-06 DIAGNOSIS — E038 Other specified hypothyroidism: Secondary | ICD-10-CM | POA: Diagnosis not present

## 2021-11-07 ENCOUNTER — Encounter: Payer: Self-pay | Admitting: Internal Medicine

## 2021-11-07 LAB — COMPLETE METABOLIC PANEL WITH GFR
AG Ratio: 1.7 (calc) (ref 1.0–2.5)
ALT: 12 U/L (ref 9–46)
AST: 12 U/L (ref 10–35)
Albumin: 4.3 g/dL (ref 3.6–5.1)
Alkaline phosphatase (APISO): 65 U/L (ref 35–144)
BUN: 20 mg/dL (ref 7–25)
CO2: 28 mmol/L (ref 20–32)
Calcium: 9.8 mg/dL (ref 8.6–10.3)
Chloride: 105 mmol/L (ref 98–110)
Creat: 1.18 mg/dL (ref 0.70–1.28)
Globulin: 2.6 g/dL (calc) (ref 1.9–3.7)
Glucose, Bld: 129 mg/dL — ABNORMAL HIGH (ref 65–99)
Potassium: 4.6 mmol/L (ref 3.5–5.3)
Sodium: 141 mmol/L (ref 135–146)
Total Bilirubin: 0.7 mg/dL (ref 0.2–1.2)
Total Protein: 6.9 g/dL (ref 6.1–8.1)
eGFR: 65 mL/min/{1.73_m2} (ref >=60)

## 2021-11-07 LAB — CBC WITH DIFFERENTIAL/PLATELET
Absolute Monocytes: 329 cells/uL (ref 200–950)
Basophils Absolute: 59 cells/uL (ref 0–200)
Basophils Relative: 1.3 %
Eosinophils Absolute: 527 cells/uL — ABNORMAL HIGH (ref 15–500)
Eosinophils Relative: 11.7 %
HCT: 46 % (ref 38.5–50.0)
Hemoglobin: 15.6 g/dL (ref 13.2–17.1)
Lymphs Abs: 1971 cells/uL (ref 850–3900)
MCH: 30.1 pg (ref 27.0–33.0)
MCHC: 33.9 g/dL (ref 32.0–36.0)
MCV: 88.6 fL (ref 80.0–100.0)
MPV: 10.3 fL (ref 7.5–12.5)
Monocytes Relative: 7.3 %
Neutro Abs: 1616 cells/uL (ref 1500–7800)
Neutrophils Relative %: 35.9 %
Platelets: 243 10*3/uL (ref 140–400)
RBC: 5.19 10*6/uL (ref 4.20–5.80)
RDW: 13.1 % (ref 11.0–15.0)
Total Lymphocyte: 43.8 %
WBC: 4.5 10*3/uL (ref 3.8–10.8)

## 2021-11-07 LAB — LIPID PANEL
Cholesterol: 178 mg/dL (ref <200)
HDL: 42 mg/dL (ref >=40)
LDL Cholesterol (Calc): 118 mg/dL (calc) — ABNORMAL HIGH
Non-HDL Cholesterol (Calc): 136 mg/dL (calc) — ABNORMAL HIGH (ref <130)
Total CHOL/HDL Ratio: 4.2 (calc) (ref <5.0)
Triglycerides: 82 mg/dL (ref <150)

## 2021-11-07 LAB — TSH: TSH: 1.49 mIU/L (ref 0.40–4.50)

## 2021-11-07 NOTE — Assessment & Plan Note (Signed)
Hypercholesterolemia  I advised the patient to follow Mediterranean diet This diet is rich in fruits vegetables and whole grain, and This diet is also rich in fish and lean meat Patient should also eat a handful of almonds or walnuts daily Recent heart study indicated that average follow-up on this kind of diet reduces the cardiovascular mortality by 50 to 70%== 

## 2021-11-07 NOTE — Assessment & Plan Note (Signed)

## 2021-11-07 NOTE — Progress Notes (Signed)
Established Patient Office Visit  Subjective:  Patient ID: Ransom Nickson, male    DOB: July 26, 1946  Age: 76 y.o. MRN: [id number]  CC:  Chief Complaint  Patient presents with   Hypertension    3 month follow up   Diabetes    3 month follow up    Hypertension  Diabetes   Adam Castillo presents for check up  Past Medical History:  Diagnosis Date   Diabetes mellitus without complication ([place])    Hypertension    Thyroid disease     Past Surgical History:  Procedure Laterality Date   COLONOSCOPY WITH PROPOFOL     COLONOSCOPY WITH PROPOFOL N/A 10/15/2021   Procedure: COLONOSCOPY WITH PROPOFOL;  Surgeon: Jonathon Bellows, MD;  Location: [facility] ENDOSCOPY;  Service: Gastroenterology;  Laterality: N/A;   HERNIA REPAIR     INSERTION OF MESH  08/27/2021   Procedure: INSERTION OF MESH;  Surgeon: Jules Husbands, MD;  Location: [facility] ORS;  Service: General;;    Family History  Problem Relation Age of Onset   COPD Mother    Heart disease Father     Social History   Socioeconomic History   Marital status: Single    Spouse name: Not on file   Number of children: Not on file   Years of education: Not on file   Highest education level: Not on file  Occupational History   Not on file  Tobacco Use   Smoking status: Never   Smokeless tobacco: Never  Vaping Use   Vaping Use: Never used  Substance and Sexual Activity   Alcohol use: Never   Drug use: Never   Sexual activity: Not on file  Other Topics Concern   Not on file  Social History Narrative   Not on file   Social Determinants of Health   Financial Resource Strain: Not on file  Food Insecurity: Not on file  Transportation Needs: Not on file  Physical Activity: Not on file  Stress: Not on file  Social Connections: Not on file  Intimate Partner Violence: Not on file     Current Outpatient Medications:    Alpha-Lipoic Acid 100 MG CAPS, Take 1 capsule (100 mg total) by mouth daily., Disp: 90 capsule, Rfl: 3   aspirin EC  81 MG tablet, Take 81 mg by mouth daily., Disp: , Rfl:    cholecalciferol (VITAMIN D3) 25 MCG (1000 UNIT) tablet, Take 1,000 Units by mouth daily., Disp: , Rfl:    levothyroxine (SYNTHROID) 50 MCG tablet, TAKE 1 TABLET BY MOUTH EVERY DAY, Disp: 90 tablet, Rfl: 2   lisinopril (ZESTRIL) 40 MG tablet, TAKE 1 TABLET BY MOUTH EVERY DAY, Disp: 90 tablet, Rfl: 3   metFORMIN (GLUCOPHAGE) 500 MG tablet, Take 1 tablet (500 mg total) by mouth 2 (two) times daily with a meal., Disp: 180 tablet, Rfl: 3   Multiple Vitamins-Minerals (CENTRUM SILVER 50+MEN PO), Take 1 tablet by mouth daily., Disp: , Rfl:    simvastatin (ZOCOR) 20 MG tablet, Take 1 tablet (20 mg total) by mouth daily., Disp: 90 tablet, Rfl: 3   No Known Allergies  ROS Review of Systems  Constitutional: Negative.   HENT: Negative.    Eyes: Negative.   Respiratory: Negative.    Cardiovascular: Negative.   Gastrointestinal: Negative.   Endocrine: Negative.   Genitourinary: Negative.   Musculoskeletal: Negative.   Skin: Negative.   Allergic/Immunologic: Negative.   Neurological: Negative.   Hematological: Negative.   Psychiatric/Behavioral: Negative.    All other systems  reviewed and are negative.    Objective:    Physical Exam Vitals reviewed.  Constitutional:      Appearance: Normal appearance.  HENT:     Mouth/Throat:     Mouth: Mucous membranes are moist.  Eyes:     Pupils: Pupils are equal, round, and reactive to light.  Neck:     Vascular: No carotid bruit.  Cardiovascular:     Rate and Rhythm: Normal rate and regular rhythm.     Pulses: Normal pulses.     Heart sounds: Normal heart sounds.  Pulmonary:     Effort: Pulmonary effort is normal.     Breath sounds: Normal breath sounds.  Abdominal:     General: Bowel sounds are normal.     Palpations: Abdomen is soft. There is no hepatomegaly, splenomegaly or mass.     Tenderness: There is no abdominal tenderness.     Hernia: No hernia is present.  Musculoskeletal:      Cervical back: Neck supple.     Right lower leg: No edema.     Left lower leg: No edema.  Skin:    Findings: No rash.  Neurological:     Mental Status: He is alert and oriented to person, place, and time.     Motor: No weakness.  Psychiatric:        Mood and Affect: Mood normal.        Behavior: Behavior normal.    BP 139/64    Pulse 88    Ht _0  (1.753 m)    Wt 198 lb 1.6 oz (89.9 kg)    BMI 29.25 kg/m  Wt Readings from Last 3 Encounters:  11/05/21 198 lb 1.6 oz (89.9 kg)  10/15/21 195 lb (88.5 kg)  10/01/21 201 lb 12.8 oz (91.5 kg)     Health Maintenance Due  Topic Date Due   Hepatitis C Screening  Never done   Zoster Vaccines- Shingrix (1 of 2) Never done   COVID-19 Vaccine (3 - Booster for Pfizer series) 01/12/2020    There are no preventive care reminders to display for this patient.  Lab Results  Component Value Date   TSH 1.49 11/06/2021   Lab Results  Component Value Date   WBC 4.5 11/06/2021   HGB 15.6 11/06/2021   HCT 46.0 11/06/2021   MCV 88.6 11/06/2021   PLT 243 11/06/2021   Lab Results  Component Value Date   NA 141 11/06/2021   K 4.6 11/06/2021   CO2 28 11/06/2021   GLUCOSE 129 (H) 11/06/2021   BUN 20 11/06/2021   CREATININE 1.18 11/06/2021   BILITOT 0.7 11/06/2021   AST 12 11/06/2021   ALT 12 11/06/2021   PROT 6.9 11/06/2021   CALCIUM 9.8 11/06/2021   ANIONGAP 5 08/27/2021   EGFR 65 11/06/2021   Lab Results  Component Value Date   CHOL 178 11/06/2021   Lab Results  Component Value Date   HDL 42 11/06/2021   Lab Results  Component Value Date   LDLCALC 118 (H) 11/06/2021   Lab Results  Component Value Date   TRIG 82 11/06/2021   Lab Results  Component Value Date   CHOLHDL 4.2 11/06/2021   Lab Results  Component Value Date   HGBA1C 5.8 (A) 11/05/2021      Assessment & Plan:   Problem List Items Addressed This Visit       Cardiovascular and Mediastinum   Essential hypertension     Patient denies any chest  pain or shortness of breath there is no history of palpitation or paroxysmal nocturnal dyspnea   patient was advised to follow low-salt low-cholesterol diet    ideally I want to keep systolic blood pressure below 130 mmHg, patient was asked to check blood pressure one times a week and give me a report on that.  Patient will be follow-up in 3 months  or earlier as needed, patient will call me back for any change in the cardiovascular symptoms Patient was advised to buy a book from local bookstore concerning blood pressure and read several chapters  every day.  This will be supplemented by some of the material we will give him from the office.  Patient should also utilize other resources like YouTube and Internet to learn more about the blood pressure and the diet.        Endocrine   Hypothyroidism due to Hashimoto's thyroiditis    Stable at the present time        Musculoskeletal and Integument   Primary osteoarthritis of right knee    Stable        Genitourinary   Stage 3a chronic kidney disease ([place])    Patient was advised to avoid nonsteroidals        Other   Dyslipidemia    Hypercholesterolemia  I advised the patient to follow Mediterranean diet This diet is rich in fruits vegetables and whole grain, and This diet is also rich in fish and lean meat Patient should also eat a handful of almonds or walnuts daily Recent heart study indicated that average follow-up on this kind of diet reduces the cardiovascular mortality by 50 to 70%==      Other Visit Diagnoses     Type 2 diabetes mellitus with diabetic neuropathic arthropathy, with long-term current use of insulin ([place])    -  Primary   Relevant Orders   POCT glucose (manual entry) (Completed)   POCT glycosylated hemoglobin (Hb A1C) (Completed)       No orders of the defined types were placed in this encounter.   Follow-up: No follow-ups on file.    Cletis Athens, MD

## 2021-11-07 NOTE — Assessment & Plan Note (Signed)
Stable

## 2021-11-07 NOTE — Assessment & Plan Note (Signed)
Patient was advised to avoid nonsteroidals

## 2021-11-07 NOTE — Assessment & Plan Note (Signed)
Stable at the present time. 

## 2021-11-11 ENCOUNTER — Encounter: Payer: Self-pay | Admitting: Internal Medicine

## 2021-11-11 ENCOUNTER — Other Ambulatory Visit: Payer: Self-pay

## 2021-11-11 ENCOUNTER — Ambulatory Visit (INDEPENDENT_AMBULATORY_CARE_PROVIDER_SITE_OTHER): Payer: Medicare Other | Admitting: Internal Medicine

## 2021-11-11 VITALS — BP 160/83 | HR 87 | Ht 69.0 in | Wt 198.3 lb

## 2021-11-11 DIAGNOSIS — I10 Essential (primary) hypertension: Secondary | ICD-10-CM | POA: Diagnosis not present

## 2021-11-11 DIAGNOSIS — M1711 Unilateral primary osteoarthritis, right knee: Secondary | ICD-10-CM

## 2021-11-11 DIAGNOSIS — E785 Hyperlipidemia, unspecified: Secondary | ICD-10-CM | POA: Diagnosis not present

## 2021-11-11 DIAGNOSIS — E038 Other specified hypothyroidism: Secondary | ICD-10-CM

## 2021-11-11 DIAGNOSIS — N1831 Chronic kidney disease, stage 3a: Secondary | ICD-10-CM

## 2021-11-11 DIAGNOSIS — E063 Autoimmune thyroiditis: Secondary | ICD-10-CM | POA: Diagnosis not present

## 2021-11-11 NOTE — Progress Notes (Signed)
Established Patient Office Visit  Subjective:  Patient ID: Adam Castillo, male    DOB: Jul 23, 1946  Age: 76 y.o. MRN: [id number]  CC:  Chief Complaint  Patient presents with   lab results     HPI  Adam Castillo presents for lab results follow up  Past Medical History:  Diagnosis Date   Diabetes mellitus without complication ([place])    Hypertension    Thyroid disease     Past Surgical History:  Procedure Laterality Date   COLONOSCOPY WITH PROPOFOL     COLONOSCOPY WITH PROPOFOL N/A 10/15/2021   Procedure: COLONOSCOPY WITH PROPOFOL;  Surgeon: Jonathon Bellows, MD;  Location: [facility] ENDOSCOPY;  Service: Gastroenterology;  Laterality: N/A;   HERNIA REPAIR     INSERTION OF MESH  08/27/2021   Procedure: INSERTION OF MESH;  Surgeon: Jules Husbands, MD;  Location: [facility] ORS;  Service: General;;    Family History  Problem Relation Age of Onset   COPD Mother    Heart disease Father     Social History   Socioeconomic History   Marital status: Single    Spouse name: Not on file   Number of children: Not on file   Years of education: Not on file   Highest education level: Not on file  Occupational History   Not on file  Tobacco Use   Smoking status: Never   Smokeless tobacco: Never  Vaping Use   Vaping Use: Never used  Substance and Sexual Activity   Alcohol use: Never   Drug use: Never   Sexual activity: Not on file  Other Topics Concern   Not on file  Social History Narrative   Not on file   Social Determinants of Health   Financial Resource Strain: Not on file  Food Insecurity: Not on file  Transportation Needs: Not on file  Physical Activity: Not on file  Stress: Not on file  Social Connections: Not on file  Intimate Partner Violence: Not on file     Current Outpatient Medications:    Alpha-Lipoic Acid 100 MG CAPS, Take 1 capsule (100 mg total) by mouth daily., Disp: 90 capsule, Rfl: 3   aspirin EC 81 MG tablet, Take 81 mg by mouth daily., Disp: , Rfl:     cholecalciferol (VITAMIN D3) 25 MCG (1000 UNIT) tablet, Take 1,000 Units by mouth daily., Disp: , Rfl:    levothyroxine (SYNTHROID) 50 MCG tablet, TAKE 1 TABLET BY MOUTH EVERY DAY, Disp: 90 tablet, Rfl: 2   lisinopril (ZESTRIL) 40 MG tablet, TAKE 1 TABLET BY MOUTH EVERY DAY, Disp: 90 tablet, Rfl: 3   metFORMIN (GLUCOPHAGE) 500 MG tablet, Take 1 tablet (500 mg total) by mouth 2 (two) times daily with a meal., Disp: 180 tablet, Rfl: 3   Multiple Vitamins-Minerals (CENTRUM SILVER 50+MEN PO), Take 1 tablet by mouth daily., Disp: , Rfl:    simvastatin (ZOCOR) 20 MG tablet, Take 1 tablet (20 mg total) by mouth daily., Disp: 90 tablet, Rfl: 3   No Known Allergies  ROS Review of Systems  Constitutional: Negative.   HENT: Negative.    Eyes: Negative.   Respiratory: Negative.    Cardiovascular: Negative.   Gastrointestinal: Negative.   Endocrine: Negative.   Genitourinary: Negative.   Musculoskeletal: Negative.   Skin: Negative.   Allergic/Immunologic: Negative.   Neurological: Negative.   Hematological: Negative.   Psychiatric/Behavioral: Negative.    All other systems reviewed and are negative.    Objective:    Physical Exam Vitals reviewed.  Constitutional:      Appearance: Normal appearance.  HENT:     Mouth/Throat:     Mouth: Mucous membranes are moist.  Eyes:     Pupils: Pupils are equal, round, and reactive to light.  Neck:     Vascular: No carotid bruit.  Cardiovascular:     Rate and Rhythm: Normal rate and regular rhythm.     Pulses: Normal pulses.     Heart sounds: Normal heart sounds.  Pulmonary:     Effort: Pulmonary effort is normal.     Breath sounds: Normal breath sounds.  Abdominal:     General: Bowel sounds are normal.     Palpations: Abdomen is soft. There is no hepatomegaly, splenomegaly or mass.     Tenderness: There is no abdominal tenderness.     Hernia: No hernia is present.  Musculoskeletal:     Cervical back: Neck supple.     Right lower leg: No  edema.     Left lower leg: No edema.  Skin:    Findings: No rash.  Neurological:     Mental Status: He is alert and oriented to person, place, and time.     Motor: No weakness.  Psychiatric:        Mood and Affect: Mood normal.        Behavior: Behavior normal.    BP (!) 160/83    Pulse 87    Ht 5' 9"  (1.753 m)    Wt 198 lb 4.8 oz (89.9 kg)    BMI 29.28 kg/m  Wt Readings from Last 3 Encounters:  11/11/21 198 lb 4.8 oz (89.9 kg)  11/05/21 198 lb 1.6 oz (89.9 kg)  10/15/21 195 lb (88.5 kg)     Health Maintenance Due  Topic Date Due   Hepatitis C Screening  Never done   Zoster Vaccines- Shingrix (1 of 2) Never done   COVID-19 Vaccine (3 - Booster for Pfizer series) 01/12/2020    There are no preventive care reminders to display for this patient.  Lab Results  Component Value Date   TSH 1.49 11/06/2021   Lab Results  Component Value Date   WBC 4.5 11/06/2021   HGB 15.6 11/06/2021   HCT 46.0 11/06/2021   MCV 88.6 11/06/2021   PLT 243 11/06/2021   Lab Results  Component Value Date   NA 141 11/06/2021   K 4.6 11/06/2021   CO2 28 11/06/2021   GLUCOSE 129 (H) 11/06/2021   BUN 20 11/06/2021   CREATININE 1.18 11/06/2021   BILITOT 0.7 11/06/2021   AST 12 11/06/2021   ALT 12 11/06/2021   PROT 6.9 11/06/2021   CALCIUM 9.8 11/06/2021   ANIONGAP 5 08/27/2021   EGFR 65 11/06/2021   Lab Results  Component Value Date   CHOL 178 11/06/2021   Lab Results  Component Value Date   HDL 42 11/06/2021   Lab Results  Component Value Date   LDLCALC 118 (H) 11/06/2021   Lab Results  Component Value Date   TRIG 82 11/06/2021   Lab Results  Component Value Date   CHOLHDL 4.2 11/06/2021   Lab Results  Component Value Date   HGBA1C 5.8 (A) 11/05/2021      Assessment & Plan:   Problem List Items Addressed This Visit       Cardiovascular and Mediastinum   Essential hypertension - Primary     Patient denies any chest pain or shortness of breath there is no  history of palpitation or  paroxysmal nocturnal dyspnea   patient was advised to follow low-salt low-cholesterol diet    ideally I want to keep systolic blood pressure below 130 mmHg, patient was asked to check blood pressure one times a week and give me a report on that.  Patient will be follow-up in 3 months  or earlier as needed, patient will call me back for any change in the cardiovascular symptoms Patient was advised to buy a book from local bookstore concerning blood pressure and read several chapters  every day.  This will be supplemented by some of the material we will give him from the office.  Patient should also utilize other resources like YouTube and Internet to learn more about the blood pressure and the diet.        Endocrine   Hypothyroidism due to Hashimoto's thyroiditis    TSH is normal        Musculoskeletal and Integument   Primary osteoarthritis of right knee    Stable at the present time,        Genitourinary   Stage 3a chronic kidney disease (HCC)    Stable at the present time,        Other   Dyslipidemia    Hypercholesterolemia  I advised the patient to follow Mediterranean diet This diet is rich in fruits vegetables and whole grain, and This diet is also rich in fish and lean meat Patient should also eat a handful of almonds or walnuts daily Recent heart study indicated that average follow-up on this kind of diet reduces the cardiovascular mortality by 50 to 70%==       No orders of the defined types were placed in this encounter.   Follow-up: No follow-ups on file.    Cletis Athens, MD

## 2021-11-11 NOTE — Assessment & Plan Note (Signed)

## 2021-11-11 NOTE — Assessment & Plan Note (Signed)
Stable at the present time,

## 2021-11-11 NOTE — Assessment & Plan Note (Signed)
Hypercholesterolemia  I advised the patient to follow Mediterranean diet This diet is rich in fruits vegetables and whole grain, and This diet is also rich in fish and lean meat Patient should also eat a handful of almonds or walnuts daily Recent heart study indicated that average follow-up on this kind of diet reduces the cardiovascular mortality by 50 to 70%== 

## 2021-11-11 NOTE — Assessment & Plan Note (Signed)
TSH is normal

## 2021-11-14 DIAGNOSIS — H353131 Nonexudative age-related macular degeneration, bilateral, early dry stage: Secondary | ICD-10-CM | POA: Diagnosis not present

## 2021-11-14 DIAGNOSIS — H25813 Combined forms of age-related cataract, bilateral: Secondary | ICD-10-CM | POA: Diagnosis not present

## 2021-12-19 ENCOUNTER — Other Ambulatory Visit: Payer: Self-pay | Admitting: Internal Medicine

## 2022-01-29 ENCOUNTER — Other Ambulatory Visit: Payer: Self-pay | Admitting: Internal Medicine

## 2022-01-29 DIAGNOSIS — E11610 Type 2 diabetes mellitus with diabetic neuropathic arthropathy: Secondary | ICD-10-CM

## 2022-02-11 ENCOUNTER — Ambulatory Visit (INDEPENDENT_AMBULATORY_CARE_PROVIDER_SITE_OTHER): Payer: Medicare Other | Admitting: Internal Medicine

## 2022-02-11 ENCOUNTER — Encounter: Payer: Self-pay | Admitting: Internal Medicine

## 2022-02-11 VITALS — BP 130/75 | HR 80 | Ht 69.0 in | Wt 195.7 lb

## 2022-02-11 DIAGNOSIS — E038 Other specified hypothyroidism: Secondary | ICD-10-CM

## 2022-02-11 DIAGNOSIS — E785 Hyperlipidemia, unspecified: Secondary | ICD-10-CM

## 2022-02-11 DIAGNOSIS — N1831 Chronic kidney disease, stage 3a: Secondary | ICD-10-CM | POA: Diagnosis not present

## 2022-02-11 DIAGNOSIS — M1711 Unilateral primary osteoarthritis, right knee: Secondary | ICD-10-CM

## 2022-02-11 DIAGNOSIS — I10 Essential (primary) hypertension: Secondary | ICD-10-CM | POA: Diagnosis not present

## 2022-02-11 DIAGNOSIS — K4091 Unilateral inguinal hernia, without obstruction or gangrene, recurrent: Secondary | ICD-10-CM

## 2022-02-11 DIAGNOSIS — E119 Type 2 diabetes mellitus without complications: Secondary | ICD-10-CM

## 2022-02-11 DIAGNOSIS — E063 Autoimmune thyroiditis: Secondary | ICD-10-CM | POA: Diagnosis not present

## 2022-02-11 LAB — GLUCOSE, POCT (MANUAL RESULT ENTRY): POC Glucose: 183 mg/dl — AB (ref 70–99)

## 2022-02-11 MED ORDER — ALPHA-LIPOIC ACID 100 MG PO CAPS: 1.0000 | ORAL_CAPSULE | Freq: Every day | ORAL | 3 refills | Status: DC

## 2022-02-11 NOTE — Assessment & Plan Note (Signed)
Stable at the present time. 

## 2022-02-11 NOTE — Assessment & Plan Note (Signed)

## 2022-02-11 NOTE — Progress Notes (Signed)
Established Patient Office Visit  Subjective:  Patient ID: Adam Castillo, male    DOB: 1946-04-29  Age: 76 y.o. MRN: [id number]  CC:  Chief Complaint  Patient presents with   Diabetes    Diabetes   Adam Castillo presents for general checkup.       he does not smoke does not drink.  Patient denies any chest pain or shortness of breath there is no history of palpitation or paroxysmal nocturnal dyspnea   patient was advised to follow low-salt low-cholesterol diet    ideally I want to keep systolic blood pressure below 130 mmHg, patient was asked to check blood pressure one times a week and give me a report on that.  Patient will be follow-up in 3 months  or earlier as needed, patient will call me back for any change in the cardiovascular symptoms Patient was advised to buy a book from local bookstore concerning blood pressure and read several chapters  every day.  This will be supplemented by some of the material we will give him from the office.  Patient should also utilize other resources like YouTube and Internet to learn more about the blood pressure and the diet.  Past Medical History:  Diagnosis Date   Diabetes mellitus without complication ([place])    Hypertension    Thyroid disease     Past Surgical History:  Procedure Laterality Date   COLONOSCOPY WITH PROPOFOL     COLONOSCOPY WITH PROPOFOL N/A 10/15/2021   Procedure: COLONOSCOPY WITH PROPOFOL;  Surgeon: Jonathon Bellows, MD;  Location: [facility] ENDOSCOPY;  Service: Gastroenterology;  Laterality: N/A;   HERNIA REPAIR     INSERTION OF MESH  08/27/2021   Procedure: INSERTION OF MESH;  Surgeon: Jules Husbands, MD;  Location: [facility] ORS;  Service: General;;    Family History  Problem Relation Age of Onset   COPD Mother    Heart disease Father     Social History   Socioeconomic History   Marital status: Single    Spouse name: Not on file   Number of children: Not on file   Years of education: Not on file   Highest education level:  Not on file  Occupational History   Not on file  Tobacco Use   Smoking status: Never   Smokeless tobacco: Never  Vaping Use   Vaping Use: Never used  Substance and Sexual Activity   Alcohol use: Never   Drug use: Never   Sexual activity: Not on file  Other Topics Concern   Not on file  Social History Narrative   Not on file   Social Determinants of Health   Financial Resource Strain: Not on file  Food Insecurity: Not on file  Transportation Needs: Not on file  Physical Activity: Not on file  Stress: Not on file  Social Connections: Not on file  Intimate Partner Violence: Not on file     Current Outpatient Medications:    aspirin EC 81 MG tablet, Take 81 mg by mouth daily., Disp: , Rfl:    cholecalciferol (VITAMIN D3) 25 MCG (1000 UNIT) tablet, Take 1,000 Units by mouth daily., Disp: , Rfl:    levothyroxine (SYNTHROID) 50 MCG tablet, TAKE 1 TABLET BY MOUTH EVERY DAY, Disp: 90 tablet, Rfl: 2   lisinopril (ZESTRIL) 40 MG tablet, TAKE 1 TABLET BY MOUTH EVERY DAY, Disp: 90 tablet, Rfl: 3   metFORMIN (GLUCOPHAGE) 500 MG tablet, TAKE 1 TABLET BY MOUTH 2 TIMES DAILY WITH A MEAL., Disp: 180 tablet,  Rfl: 3   Multiple Vitamins-Minerals (CENTRUM SILVER 50+MEN PO), Take 1 tablet by mouth daily., Disp: , Rfl:    simvastatin (ZOCOR) 20 MG tablet, Take 1 tablet (20 mg total) by mouth daily., Disp: 90 tablet, Rfl: 3   Alpha-Lipoic Acid 100 MG CAPS, Take 1 capsule (100 mg total) by mouth daily., Disp: 90 capsule, Rfl: 3   No Known Allergies  ROS Review of Systems  Constitutional: Negative.   HENT: Negative.    Eyes: Negative.   Respiratory: Negative.    Cardiovascular: Negative.   Gastrointestinal: Negative.   Endocrine: Negative.   Genitourinary: Negative.   Musculoskeletal: Negative.   Skin: Negative.   Allergic/Immunologic: Negative.   Neurological: Negative.   Hematological: Negative.   Psychiatric/Behavioral: Negative.    All other systems reviewed and are negative.     Objective:    Physical Exam Vitals reviewed.  Constitutional:      Appearance: Normal appearance.  HENT:     Mouth/Throat:     Mouth: Mucous membranes are moist.  Eyes:     Pupils: Pupils are equal, round, and reactive to light.  Neck:     Vascular: No carotid bruit.  Cardiovascular:     Rate and Rhythm: Normal rate and regular rhythm.     Pulses: Normal pulses.     Heart sounds: Normal heart sounds.  Pulmonary:     Effort: Pulmonary effort is normal.     Breath sounds: Normal breath sounds.  Abdominal:     General: Bowel sounds are normal.     Palpations: Abdomen is soft. There is no hepatomegaly, splenomegaly or mass.     Tenderness: There is no abdominal tenderness.     Hernia: No hernia is present.  Musculoskeletal:     Cervical back: Neck supple.     Right lower leg: No edema.     Left lower leg: No edema.  Skin:    Findings: No rash.  Neurological:     Mental Status: He is alert and oriented to person, place, and time.     Motor: No weakness.  Psychiatric:        Mood and Affect: Mood normal.        Behavior: Behavior normal.    BP 130/75   Pulse 80   Ht 5' 9"  (1.753 m)   Wt 195 lb 11.2 oz (88.8 kg)   BMI 28.90 kg/m  Wt Readings from Last 3 Encounters:  02/11/22 195 lb 11.2 oz (88.8 kg)  11/11/21 198 lb 4.8 oz (89.9 kg)  11/05/21 198 lb 1.6 oz (89.9 kg)     Health Maintenance Due  Topic Date Due   Hepatitis C Screening  Never done   Zoster Vaccines- Shingrix (1 of 2) Never done   COVID-19 Vaccine (3 - Booster for Pfizer series) 01/12/2020    There are no preventive care reminders to display for this patient.  Lab Results  Component Value Date   TSH 1.49 11/06/2021   Lab Results  Component Value Date   WBC 4.5 11/06/2021   HGB 15.6 11/06/2021   HCT 46.0 11/06/2021   MCV 88.6 11/06/2021   PLT 243 11/06/2021   Lab Results  Component Value Date   NA 141 11/06/2021   K 4.6 11/06/2021   CO2 28 11/06/2021   GLUCOSE 129 (H) 11/06/2021    BUN 20 11/06/2021   CREATININE 1.18 11/06/2021   BILITOT 0.7 11/06/2021   AST 12 11/06/2021   ALT 12 11/06/2021   PROT  6.9 11/06/2021   CALCIUM 9.8 11/06/2021   ANIONGAP 5 08/27/2021   EGFR 65 11/06/2021   Lab Results  Component Value Date   CHOL 178 11/06/2021   Lab Results  Component Value Date   HDL 42 11/06/2021   Lab Results  Component Value Date   LDLCALC 118 (H) 11/06/2021   Lab Results  Component Value Date   TRIG 82 11/06/2021   Lab Results  Component Value Date   CHOLHDL 4.2 11/06/2021   Lab Results  Component Value Date   HGBA1C 5.8 (A) 11/05/2021      Assessment & Plan:   Problem List Items Addressed This Visit       Cardiovascular and Mediastinum   Essential hypertension    The following hypertensive lifestyle modification were recommended and discussed:  1. Limiting alcohol intake to less than 1 oz/day of ethanol:(24 oz of beer or 8 oz of wine or 2 oz of 100-proof whiskey). 2. Take baby ASA 81 mg daily. 3. Importance of regular aerobic exercise and losing weight. 4. Reduce dietary saturated fat and cholesterol intake for overall cardiovascular health. 5. Maintaining adequate dietary potassium, calcium, and magnesium intake. 6. Regular monitoring of the blood pressure. 7. Reduce sodium intake to less than 100 mmol/day (less than 2.3 gm of sodium or less than 6 gm of sodium choride)          Endocrine   Hypothyroidism due to Hashimoto's thyroiditis     Musculoskeletal and Integument   Primary osteoarthritis of right knee    Stable at the present time         Genitourinary   Stage 3a chronic kidney disease (HCC)    Stable at the present time         Other   Dyslipidemia    Hypercholesterolemia  I advised the patient to follow Mediterranean diet This diet is rich in fruits vegetables and whole grain, and This diet is also rich in fish and lean meat Patient should also eat a handful of almonds or walnuts daily Recent heart study  indicated that average follow-up on this kind of diet reduces the cardiovascular mortality by 50 to 70%==       Unilateral recurrent inguinal hernia without obstruction or gangrene    Stable at the present time       Other Visit Diagnoses     Type 2 diabetes mellitus without complication, without long-term current use of insulin ([place])    -  Primary   Relevant Orders   POCT glucose (manual entry) (Completed)       Meds ordered this encounter  Medications   Alpha-Lipoic Acid 100 MG CAPS    Sig: Take 1 capsule (100 mg total) by mouth daily.    Dispense:  90 capsule    Refill:  3    Follow-up: No follow-ups on file.    Cletis Athens, MD

## 2022-02-11 NOTE — Assessment & Plan Note (Signed)
Hypercholesterolemia  I advised the patient to follow Mediterranean diet This diet is rich in fruits vegetables and whole grain, and This diet is also rich in fish and lean meat Patient should also eat a handful of almonds or walnuts daily Recent heart study indicated that average follow-up on this kind of diet reduces the cardiovascular mortality by 50 to 70%== 

## 2022-04-09 ENCOUNTER — Other Ambulatory Visit: Payer: Self-pay | Admitting: Internal Medicine

## 2022-05-01 DIAGNOSIS — H353131 Nonexudative age-related macular degeneration, bilateral, early dry stage: Secondary | ICD-10-CM | POA: Diagnosis not present

## 2022-05-12 ENCOUNTER — Encounter: Payer: Self-pay | Admitting: Internal Medicine

## 2022-05-12 ENCOUNTER — Ambulatory Visit (INDEPENDENT_AMBULATORY_CARE_PROVIDER_SITE_OTHER): Payer: Medicare Other | Admitting: Internal Medicine

## 2022-05-12 VITALS — BP 132/80 | HR 89 | Ht 69.0 in | Wt 196.2 lb

## 2022-05-12 DIAGNOSIS — E063 Autoimmune thyroiditis: Secondary | ICD-10-CM | POA: Diagnosis not present

## 2022-05-12 DIAGNOSIS — I10 Essential (primary) hypertension: Secondary | ICD-10-CM | POA: Diagnosis not present

## 2022-05-12 DIAGNOSIS — E038 Other specified hypothyroidism: Secondary | ICD-10-CM

## 2022-05-12 DIAGNOSIS — E119 Type 2 diabetes mellitus without complications: Secondary | ICD-10-CM | POA: Diagnosis not present

## 2022-05-12 DIAGNOSIS — N1831 Chronic kidney disease, stage 3a: Secondary | ICD-10-CM

## 2022-05-12 DIAGNOSIS — E785 Hyperlipidemia, unspecified: Secondary | ICD-10-CM | POA: Diagnosis not present

## 2022-05-12 LAB — POCT GLYCOSYLATED HEMOGLOBIN (HGB A1C): HbA1c POC (<> result, manual entry): 5.6 % (ref 4.0–5.6)

## 2022-05-12 LAB — GLUCOSE, POCT (MANUAL RESULT ENTRY): POC Glucose: 215 mg/dl — AB (ref 70–99)

## 2022-05-12 MED ORDER — ALPHA-LIPOIC ACID 100 MG PO CAPS: 1.0000 | ORAL_CAPSULE | Freq: Every day | ORAL | 3 refills | Status: DC

## 2022-05-12 NOTE — Assessment & Plan Note (Signed)
Hypercholesterolemia  I advised the patient to follow Mediterranean diet This diet is rich in fruits vegetables and whole grain, and This diet is also rich in fish and lean meat Patient should also eat a handful of almonds or walnuts daily Recent heart study indicated that average follow-up on this kind of diet reduces the cardiovascular mortality by 50 to 70%== 

## 2022-05-12 NOTE — Assessment & Plan Note (Signed)
Follow-up renal function studies next month

## 2022-05-12 NOTE — Progress Notes (Signed)
 Established Patient Office Visit  Subjective:  Patient ID: Adam Castillo, male    DOB: 09/11/1946  Age: 75 y.o. MRN: [id number]  CC:  Chief Complaint  Patient presents with   Diabetes    Diabetes    Adam Castillo presents for complaint of numbness of the feet and walking, also complains of shortness of breath on exertion.  His blood sugar is high today but he states that he ate before coming here..  Plan is to do a chest x-ray and a stress test   Past Medical History:  Diagnosis Date   Diabetes mellitus without complication (HCC)    Hypertension    Thyroid disease     Past Surgical History:  Procedure Laterality Date   COLONOSCOPY WITH PROPOFOL     COLONOSCOPY WITH PROPOFOL N/A 10/15/2021   Procedure: COLONOSCOPY WITH PROPOFOL;  Surgeon: Anna, Kiran, MD;  Location: [facility] ENDOSCOPY;  Service: Gastroenterology;  Laterality: N/A;   HERNIA REPAIR     INSERTION OF MESH  08/27/2021   Procedure: INSERTION OF MESH;  Surgeon: Pabon, Diego F, MD;  Location: [facility] ORS;  Service: General;;    Family History  Problem Relation Age of Onset   COPD Mother    Heart disease Father     Social History   Socioeconomic History   Marital status: Single    Spouse name: Not on file   Number of children: Not on file   Years of education: Not on file   Highest education level: Not on file  Occupational History   Not on file  Tobacco Use   Smoking status: Never   Smokeless tobacco: Never  Vaping Use   Vaping Use: Never used  Substance and Sexual Activity   Alcohol use: Never   Drug use: Never   Sexual activity: Not on file  Other Topics Concern   Not on file  Social History Narrative   Not on file   Social Determinants of Health   Financial Resource Strain: Not on file  Food Insecurity: Not on file  Transportation Needs: Not on file  Physical Activity: Not on file  Stress: Not on file  Social Connections: Not on file  Intimate Partner Violence: Not on file     Current  Outpatient Medications:    aspirin EC 81 MG tablet, Take 81 mg by mouth daily., Disp: , Rfl:    cholecalciferol (VITAMIN D3) 25 MCG (1000 UNIT) tablet, Take 1,000 Units by mouth daily., Disp: , Rfl:    levothyroxine (SYNTHROID) 50 MCG tablet, TAKE 1 TABLET BY MOUTH EVERY DAY, Disp: 90 tablet, Rfl: 2   lisinopril (ZESTRIL) 40 MG tablet, TAKE 1 TABLET BY MOUTH EVERY DAY, Disp: 90 tablet, Rfl: 3   metFORMIN (GLUCOPHAGE) 500 MG tablet, TAKE 1 TABLET BY MOUTH 2 TIMES DAILY WITH A MEAL., Disp: 180 tablet, Rfl: 3   Multiple Vitamins-Minerals (CENTRUM SILVER 50+MEN PO), Take 1 tablet by mouth daily., Disp: , Rfl:    simvastatin (ZOCOR) 20 MG tablet, Take 1 tablet (20 mg total) by mouth daily., Disp: 90 tablet, Rfl: 3   Alpha-Lipoic Acid 100 MG CAPS, Take 1 capsule (100 mg total) by mouth daily., Disp: 90 capsule, Rfl: 3   No Known Allergies  ROS Review of Systems  Constitutional: Negative.   HENT: Negative.    Eyes: Negative.   Respiratory: Negative.    Cardiovascular: Negative.   Gastrointestinal: Negative.   Endocrine: Negative.   Genitourinary: Negative.   Musculoskeletal: Negative.   Skin: Negative.     Allergic/Immunologic: Negative.   Neurological: Negative.   Hematological: Negative.   Psychiatric/Behavioral: Negative.    All other systems reviewed and are negative.     Objective:    Physical Exam Vitals reviewed.  Constitutional:      Appearance: Normal appearance.  HENT:     Mouth/Throat:     Mouth: Mucous membranes are moist.  Eyes:     Pupils: Pupils are equal, round, and reactive to light.  Neck:     Vascular: No carotid bruit.  Cardiovascular:     Rate and Rhythm: Normal rate and regular rhythm.     Pulses: Normal pulses.     Heart sounds: Normal heart sounds.  Pulmonary:     Effort: Pulmonary effort is normal.     Breath sounds: Normal breath sounds.  Abdominal:     General: Bowel sounds are normal.     Palpations: Abdomen is soft. There is no hepatomegaly,  splenomegaly or mass.     Tenderness: There is no abdominal tenderness.     Hernia: No hernia is present.  Musculoskeletal:     Cervical back: Neck supple.     Right lower leg: No edema.     Left lower leg: No edema.  Skin:    Findings: No rash.  Neurological:     Mental Status: He is alert and oriented to person, place, and time.     Motor: No weakness.  Psychiatric:        Mood and Affect: Mood normal.        Behavior: Behavior normal.     BP 132/80   Pulse 89   Ht 5' 9" (1.753 m)   Wt 196 lb 3.2 oz (89 kg)   BMI 28.97 kg/m  Wt Readings from Last 3 Encounters:  05/12/22 196 lb 3.2 oz (89 kg)  02/11/22 195 lb 11.2 oz (88.8 kg)  11/11/21 198 lb 4.8 oz (89.9 kg)     Health Maintenance Due  Topic Date Due   Hepatitis C Screening  Never done   Zoster Vaccines- Shingrix (1 of 2) Never done   COVID-19 Vaccine (3 - Pfizer series) 01/12/2020   INFLUENZA VACCINE  04/15/2022    There are no preventive care reminders to display for this patient.  Lab Results  Component Value Date   TSH 1.49 11/06/2021   Lab Results  Component Value Date   WBC 4.5 11/06/2021   HGB 15.6 11/06/2021   HCT 46.0 11/06/2021   MCV 88.6 11/06/2021   PLT 243 11/06/2021   Lab Results  Component Value Date   NA 141 11/06/2021   K 4.6 11/06/2021   CO2 28 11/06/2021   GLUCOSE 129 (H) 11/06/2021   BUN 20 11/06/2021   CREATININE 1.18 11/06/2021   BILITOT 0.7 11/06/2021   AST 12 11/06/2021   ALT 12 11/06/2021   PROT 6.9 11/06/2021   CALCIUM 9.8 11/06/2021   ANIONGAP 5 08/27/2021   EGFR 65 11/06/2021   Lab Results  Component Value Date   CHOL 178 11/06/2021   Lab Results  Component Value Date   HDL 42 11/06/2021   Lab Results  Component Value Date   LDLCALC 118 (H) 11/06/2021   Lab Results  Component Value Date   TRIG 82 11/06/2021   Lab Results  Component Value Date   CHOLHDL 4.2 11/06/2021   Lab Results  Component Value Date   HGBA1C 5.6 05/12/2022      Assessment  & Plan:   Problem List Items   Addressed This Visit       Cardiovascular and Mediastinum   Essential hypertension     Patient denies any chest pain or shortness of breath there is no history of palpitation or paroxysmal nocturnal dyspnea   patient was advised to follow low-salt low-cholesterol diet    ideally I want to keep systolic blood pressure below 130 mmHg, patient was asked to check blood pressure one times a week and give me a report on that.  Patient will be follow-up in 3 months  or earlier as needed, patient will call me back for any change in the cardiovascular symptoms Patient was advised to buy a book from local bookstore concerning blood pressure and read several chapters  every day.  This will be supplemented by some of the material we will give him from the office.  Patient should also utilize other resources like YouTube and Internet to learn more about the blood pressure and the diet.        Endocrine   Hypothyroidism due to Hashimoto's thyroiditis    Stable        Genitourinary   Stage 3a chronic kidney disease (HCC)    Follow-up renal function studies next month        Other   Dyslipidemia    Hypercholesterolemia  I advised the patient to follow Mediterranean diet This diet is rich in fruits vegetables and whole grain, and This diet is also rich in fish and lean meat Patient should also eat a handful of almonds or walnuts daily Recent heart study indicated that average follow-up on this kind of diet reduces the cardiovascular mortality by 50 to 70%==      Other Visit Diagnoses     Type 2 diabetes mellitus without complication, without long-term current use of insulin (HCC)    -  Primary   Relevant Orders   POCT glucose (manual entry) (Completed)   POCT HgB A1C (Completed)       Meds ordered this encounter  Medications   Alpha-Lipoic Acid 100 MG CAPS    Sig: Take 1 capsule (100 mg total) by mouth daily.    Dispense:  90 capsule    Refill:  3     Follow-up: No follow-ups on file.    Javed Masoud, MD 

## 2022-05-12 NOTE — Assessment & Plan Note (Signed)

## 2022-05-12 NOTE — Assessment & Plan Note (Signed)
Stable

## 2022-05-31 DIAGNOSIS — Z23 Encounter for immunization: Secondary | ICD-10-CM | POA: Diagnosis not present

## 2022-07-15 DIAGNOSIS — Z23 Encounter for immunization: Secondary | ICD-10-CM | POA: Diagnosis not present

## 2022-07-25 ENCOUNTER — Telehealth: Payer: Self-pay | Admitting: Internal Medicine

## 2022-07-25 NOTE — Telephone Encounter (Signed)
R/S AWV ON 08/11/2022. If patient calls office, please transfer call to me jabber [phone]. Thank you.

## 2022-08-11 ENCOUNTER — Ambulatory Visit: Payer: Medicare Other

## 2022-08-11 ENCOUNTER — Encounter: Payer: Medicare Other | Admitting: Internal Medicine

## 2022-08-11 ENCOUNTER — Ambulatory Visit (INDEPENDENT_AMBULATORY_CARE_PROVIDER_SITE_OTHER): Payer: Medicare Other

## 2022-08-11 VITALS — Ht 69.0 in | Wt 196.0 lb

## 2022-08-11 DIAGNOSIS — E119 Type 2 diabetes mellitus without complications: Secondary | ICD-10-CM | POA: Diagnosis not present

## 2022-08-11 DIAGNOSIS — E063 Autoimmune thyroiditis: Secondary | ICD-10-CM | POA: Diagnosis not present

## 2022-08-11 DIAGNOSIS — Z125 Encounter for screening for malignant neoplasm of prostate: Secondary | ICD-10-CM

## 2022-08-11 DIAGNOSIS — E038 Other specified hypothyroidism: Secondary | ICD-10-CM

## 2022-08-11 DIAGNOSIS — E785 Hyperlipidemia, unspecified: Secondary | ICD-10-CM

## 2022-08-11 DIAGNOSIS — Z0000 Encounter for general adult medical examination without abnormal findings: Secondary | ICD-10-CM | POA: Diagnosis not present

## 2022-08-11 DIAGNOSIS — I10 Essential (primary) hypertension: Secondary | ICD-10-CM | POA: Diagnosis not present

## 2022-08-11 NOTE — Progress Notes (Signed)
Virtual Visit via Telephone Note  I connected with  Adam Castillo on 08/11/22 at 11:00 AM EST by telephone and verified that I am speaking with the correct person using two identifiers.  Location: Patient: home Provider: [facility] Persons participating in the virtual visit: patient/Nurse Health Advisor   I discussed the limitations, risks, security and privacy concerns of performing an evaluation and management service by telephone and the availability of in person appointments. The patient expressed understanding and agreed to proceed.  Interactive audio and video telecommunications were attempted between this nurse and patient, however failed, due to patient having technical difficulties OR patient did not have access to video capability.  We continued and completed visit with audio only.  Some vital signs may be absent or patient reported.   Dionisio David, LPN  Subjective:   Adam Castillo is a 76 y.o. male who presents for Medicare Annual/Subsequent preventive examination.  Review of Systems     Cardiac Risk Factors include: advanced age (>59mn, >>82women);diabetes mellitus;male gender     Objective:    There were no vitals filed for this visit. There is no height or weight on file to calculate BMI.     08/11/2022   11:10 AM 10/15/2021   10:10 AM 08/27/2021    8:47 AM 07/27/2018    2:36 PM  Advanced Directives  Does Patient Have a Medical Advance Directive? No No No No  Would patient like information on creating a medical advance directive? No - Patient declined  No - Patient declined No - Patient declined    Current Medications (verified) Outpatient Encounter Medications as of 08/11/2022  Medication Sig   Alpha-Lipoic Acid 100 MG CAPS Take 1 capsule (100 mg total) by mouth daily.   aspirin EC 81 MG tablet Take 81 mg by mouth daily.   cholecalciferol (VITAMIN D3) 25 MCG (1000 UNIT) tablet Take 1,000 Units by mouth daily.   levothyroxine (SYNTHROID) 50 MCG tablet TAKE 1  TABLET BY MOUTH EVERY DAY   lisinopril (ZESTRIL) 40 MG tablet TAKE 1 TABLET BY MOUTH EVERY DAY   metFORMIN (GLUCOPHAGE) 500 MG tablet TAKE 1 TABLET BY MOUTH 2 TIMES DAILY WITH A MEAL.   Multiple Vitamins-Minerals (CENTRUM SILVER 50+MEN PO) Take 1 tablet by mouth daily.   simvastatin (ZOCOR) 20 MG tablet Take 1 tablet (20 mg total) by mouth daily.   No facility-administered encounter medications on file as of 08/11/2022.    Allergies (verified) Patient has no known allergies.   History: Past Medical History:  Diagnosis Date   [place]    Hypertension    Thyroid disease    Past Surgical History:  Procedure Laterality Date   COLONOSCOPY WITH PROPOFOL     COLONOSCOPY WITH PROPOFOL N/A 10/15/2021   Procedure: COLONOSCOPY WITH PROPOFOL;  Surgeon: AJonathon Bellows MD;  [facility]  Service: Gastroenterology;  Laterality: N/A;   HERNIA REPAIR     INSERTION OF MESH  08/27/2021   Procedure: INSERTION OF MESH;  Surgeon: PJules Husbands MD;  Location: [facility] ORS;  Service: General;;   Family History  Problem Relation Age of Onset   COPD Mother    Heart disease Father    Social History   Socioeconomic History   Marital status: Single    Spouse name: Not on file   Number of children: Not on file   Years of education: Not on file   Highest education level: Not on file  Occupational History   Not on  file  Tobacco Use   Smoking status: Never   Smokeless tobacco: Never  Vaping Use   Vaping Use: Never used  Substance and Sexual Activity   Alcohol use: Never   Drug use: Never   Sexual activity: Not on file  Other Topics Concern   Not on file  Social History Narrative   Not on file   Social Determinants of Health   Financial Resource Strain: Low Risk  (08/11/2022)   Overall Financial Resource Strain (CARDIA)    Difficulty of Paying Living Expenses: Not hard at all  Food Insecurity: No Food Insecurity (08/11/2022)   Hunger Vital Sign     Worried About Running Out of Food in the Last Year: Never true    Ran Out of Food in the Last Year: Never true  Transportation Needs: No Transportation Needs (08/11/2022)   PRAPARE - Hydrologist (Medical): No    Lack of Transportation (Non-Medical): No  Physical Activity: Inactive (08/11/2022)   Exercise Vital Sign    Days of Exercise per Week: 0 days    Minutes of Exercise per Session: 0 min  Stress: No Stress Concern Present (08/11/2022)   [place]    Feeling of Stress : Only a little  Social Connections: Socially Isolated (08/11/2022)   Social Connection and Isolation Panel [NHANES]    Frequency of Communication with Friends and Family: Twice a week    Frequency of Social Gatherings with Friends and Family: Once a week    Attends Religious Services: Never    Marine scientist or Organizations: No    Attends Music therapist: Never    Marital Status: Divorced    Tobacco Counseling Counseling given: Not Answered   Clinical Intake:  Pre-visit preparation completed: Yes  Pain : No/denies pain     Nutritional Risks: None Diabetes: Yes CBG done?: No Did pt. bring in CBG monitor from home?: No  How often do you need to have someone help you when you read instructions, pamphlets, or other written materials from your doctor or pharmacy?: 1 - Never  Diabetic?Nutrition Risk Assessment:  Has the patient had any N/V/D within the last 2 months?  No  Does the patient have any non-healing wounds?  No  Has the patient had any unintentional weight loss or weight gain?  No   Diabetes:  Is the patient diabetic?  Yes  If diabetic, was a CBG obtained today?  No  Did the patient bring in their glucometer from home?  No  How often do you monitor your CBG's? never.   Financial Strains and Diabetes Management:  Are you having any financial strains with the device,  your supplies or your medication? No .  Does the patient want to be seen by Chronic Care Management for management of their diabetes?  No  Would the patient like to be referred to a Nutritionist or for Diabetic Management?  No   Diabetic Exams:  Diabetic Eye Exam: Completed April 2023 per pt.  Pt has been advised about the importance in completing this exam. .  Diabetic Foot Exam: Completed 6 months ago per pt. Pt has been advised about the importance in completing this exam.        Interpreter Needed?: No  Information entered by :: Kirke Shaggy, LPN   Activities of Daily Living    08/11/2022   11:12 AM  In your present state of health, do  you have any difficulty performing the following activities:  Hearing? 0  Vision? 0  Difficulty concentrating or making decisions? 0  Walking or climbing stairs? 0  Dressing or bathing? 0  Doing errands, shopping? 0  Preparing Food and eating ? N  Using the Toilet? N  In the past six months, have you accidently leaked urine? N  Do you have problems with loss of bowel control? N  Managing your Medications? N  Managing your Finances? N  Housekeeping or managing your Housekeeping? N    Patient Care Team: Cletis Athens, MD as PCP - General (Internal Medicine)  Indicate any recent Medical Services you may have received from other than [facility] providers in the past year (date may be approximate).     Assessment:   This is a routine wellness examination for Adam Castillo.  Hearing/Vision screen Hearing Screening - Comments:: No aids Vision Screening - Comments:: No glasses- Dr.Bell  Dietary issues and exercise activities discussed: Current Exercise Habits: The patient does not participate in regular exercise at present   Goals Addressed             This Visit's Progress    DIET - EAT MORE FRUITS AND VEGETABLES         Depression Screen    08/11/2022   11:08 AM 05/12/2022    8:55 AM 11/11/2021   11:08 AM 11/05/2021    8:54 AM  08/05/2021    8:51 AM 08/02/2020    9:50 AM  PHQ 2/9 Scores  PHQ - 2 Score 1 0 0 0 0 0  PHQ- 9 Score 1         Fall Risk    08/11/2022   11:11 AM 05/12/2022    8:55 AM 11/11/2021   11:08 AM 11/05/2021    8:54 AM 08/26/2021    9:13 AM  Fall Risk   Falls in the past year? 0 0 0 0 0  Number falls in past yr: 0 0 0 0   Injury with Fall? 0 0 0 0   Risk for fall due to : No Fall Risks No Fall Risks No Fall Risks No Fall Risks   Follow up Falls prevention discussed;Falls evaluation completed Falls evaluation completed Falls evaluation completed Falls evaluation completed     FALL RISK PREVENTION PERTAINING TO THE HOME:  Any stairs in or around the home? No  If so, are there any without handrails? No  Home free of loose throw rugs in walkways, pet beds, electrical cords, etc? Yes  Adequate lighting in your home to reduce risk of falls? Yes   ASSISTIVE DEVICES UTILIZED TO PREVENT FALLS:  Life alert? No  Use of a cane, walker or w/c? No  Grab bars in the bathroom? No  Shower chair or bench in shower? Yes  Elevated toilet seat or a handicapped toilet? No    Cognitive Function:        08/11/2022   11:12 AM  6CIT Screen  What Year? 0 points  What month? 0 points  What time? 0 points  Count back from 20 0 points  Months in reverse 0 points  Repeat phrase 4 points  Total Score 4 points    Immunizations Immunization History  Administered Date(s) Administered   Fluad Quad(high Dose 65+) 07/15/2021   Influenza,inj,Quad PF,6+ Mos 06/23/2018   Influenza-Unspecified 07/16/2018, 06/16/2020   PFIZER(Purple Top)SARS-COV-2 Vaccination 10/27/2019, 11/17/2019   Pneumococcal Conjugate-13 08/15/2014   Tdap 08/02/2020    TDAP status: Up to date  Flu Vaccine status: Up to date  Pneumococcal vaccine status: Due, Education has been provided regarding the importance of this vaccine. Advised may receive this vaccine at local pharmacy or Health Dept. Aware to provide a copy of the  vaccination record if obtained from local pharmacy or Health Dept. Verbalized acceptance and understanding.  Covid-19 vaccine status: Completed vaccines  Qualifies for Shingles Vaccine? Yes   Zostavax completed No   Shingrix Completed?: No.    Education has been provided regarding the importance of this vaccine. Patient has been advised to call insurance company to determine out of pocket expense if they have not yet received this vaccine. Advised may also receive vaccine at local pharmacy or Health Dept. Verbalized acceptance and understanding.  Screening Tests Health Maintenance  Topic Date Due   Diabetic kidney evaluation - Urine ACR  Never done   Hepatitis C Screening  Never done   Zoster Vaccines- Shingrix (1 of 2) Never done   Pneumonia Vaccine 68+ Years old (2 - PPSV23 or PCV20) 08/16/2015   INFLUENZA VACCINE  04/15/2022   COVID-19 Vaccine (3 - 2023-24 season) 05/16/2022   Diabetic kidney evaluation - GFR measurement  11/06/2022   Medicare Annual Wellness (AWV)  08/12/2023   COLONOSCOPY (Pts 45-31yr Insurance coverage will need to be confirmed)  10/16/2031   HPV VACCINES  Aged Out    Health Maintenance  Health Maintenance Due  Topic Date Due   Diabetic kidney evaluation - Urine ACR  Never done   Hepatitis C Screening  Never done   Zoster Vaccines- Shingrix (1 of 2) Never done   Pneumonia Vaccine 75 Years old (2 - PPSV23 or PCV20) 08/16/2015   INFLUENZA VACCINE  04/15/2022   COVID-19 Vaccine (3 - 2023-24 season) 05/16/2022    Colorectal cancer screening: Type of screening: Colonoscopy. Completed 10/15/21. Repeat every 10 years  Lung Cancer Screening: (Low Dose CT Chest recommended if Age 75-80years, 30 pack-year currently smoking OR have quit w/in 15years.) does not qualify.   Additional Screening:  Hepatitis C Screening: does not qualify; Completed no  Vision Screening: Recommended annual ophthalmology exams for early detection of glaucoma and other disorders of  the eye. Is the patient up to date with their annual eye exam?  Yes  Who is the provider or what is the name of the office in which the patient attends annual eye exams? Dr.Bell If pt is not established with a provider, would they like to be referred to a provider to establish care? No .   Dental Screening: Recommended annual dental exams for proper oral hygiene  Community Resource Referral / Chronic Care Management: CRR required this visit?  No   CCM required this visit?  No      Plan:     I have personally reviewed and noted the following in the patient's chart:   Medical and social history Use of alcohol, tobacco or illicit drugs  Current medications and supplements including opioid prescriptions. Patient is not currently taking opioid prescriptions. Functional ability and status Nutritional status Physical activity Advanced directives List of other physicians Hospitalizations, surgeries, and ER visits in previous 12 months Vitals Screenings to include cognitive, depression, and falls Referrals and appointments  In addition, I have reviewed and discussed with patient certain preventive protocols, quality metrics, and best practice recommendations. A written personalized care plan for preventive services as well as general preventive health recommendations were provided to patient.     LDionisio David LPN   [id number]  Nurse Notes: none

## 2022-08-11 NOTE — Patient Instructions (Signed)
Adam Castillo , Thank you for taking time to come for your Medicare Wellness Visit. I appreciate your ongoing commitment to your health goals. Please review the following plan we discussed and let me know if I can assist you in the future.   Screening recommendations/referrals: Colonoscopy: 10/15/21 Recommended yearly ophthalmology/optometry visit for glaucoma screening and checkup Recommended yearly dental visit for hygiene and checkup  Vaccinations: Influenza vaccine: 07/15/21 Pneumococcal vaccine: 08/15/14, need 2nd shot Tdap vaccine: 08/02/20 Shingles vaccine: n/d   Covid-19: 10/27/19, 11/17/19  Advanced directives: no  Conditions/risks identified: none  Next appointment: Follow up in one year for your annual wellness visit. 08/17/23 @ 10:30 am by phone  Preventive Care 65 Years and Older, Male Preventive care refers to lifestyle choices and visits with your health care provider that can promote health and wellness. What does preventive care include? A yearly physical exam. This is also called an annual well check. Dental exams once or twice a year. Routine eye exams. Ask your health care provider how often you should have your eyes checked. Personal lifestyle choices, including: Daily care of your teeth and gums. Regular physical activity. Eating a healthy diet. Avoiding tobacco and drug use. Limiting alcohol use. Practicing safe sex. Taking low doses of aspirin every day. Taking vitamin and mineral supplements as recommended by your health care provider. What happens during an annual well check? The services and screenings done by your health care provider during your annual well check will depend on your age, overall health, lifestyle risk factors, and family history of disease. Counseling  Your health care provider may ask you questions about your: Alcohol use. Tobacco use. Drug use. Emotional well-being. Home and relationship well-being. Sexual activity. Eating  habits. History of falls. Memory and ability to understand (cognition). Work and work Statistician. Screening  You may have the following tests or measurements: Height, weight, and BMI. Blood pressure. Lipid and cholesterol levels. These may be checked every 5 years, or more frequently if you are over 8 years old. Skin check. Lung cancer screening. You may have this screening every year starting at age 21 if you have a 30-pack-year history of smoking and currently smoke or have quit within the past 15 years. Fecal occult blood test (FOBT) of the stool. You may have this test every year starting at age 75. Flexible sigmoidoscopy or colonoscopy. You may have a sigmoidoscopy every 5 years or a colonoscopy every 10 years starting at age 78. Prostate cancer screening. Recommendations will vary depending on your family history and other risks. Hepatitis C blood test. Hepatitis B blood test. Sexually transmitted disease (STD) testing. Diabetes screening. This is done by checking your blood sugar (glucose) after you have not eaten for a while (fasting). You may have this done every 1-3 years. Abdominal aortic aneurysm (AAA) screening. You may need this if you are a current or former smoker. Osteoporosis. You may be screened starting at age 31 if you are at high risk. Talk with your health care provider about your test results, treatment options, and if necessary, the need for more tests. Vaccines  Your health care provider may recommend certain vaccines, such as: Influenza vaccine. This is recommended every year. Tetanus, diphtheria, and acellular pertussis (Tdap, Td) vaccine. You may need a Td booster every 10 years. Zoster vaccine. You may need this after age 46. Pneumococcal 13-valent conjugate (PCV13) vaccine. One dose is recommended after age 7. Pneumococcal polysaccharide (PPSV23) vaccine. One dose is recommended after age 25. Talk to your  health care provider about which screenings and  vaccines you need and how often you need them. This information is not intended to replace advice given to you by your health care provider. Make sure you discuss any questions you have with your health care provider. Document Released: 09/28/2015 Document Revised: 05/21/2016 Document Reviewed: 07/03/2015 Elsevier Interactive Patient Education  2017 [employer] Prevention in the Home Falls can cause injuries. They can happen to people of all ages. There are many things you can do to make your home safe and to help prevent falls. What can I do on the outside of my home? Regularly fix the edges of walkways and driveways and fix any cracks. Remove anything that might make you trip as you walk through a door, such as a raised step or threshold. Trim any bushes or trees on the path to your home. Use bright outdoor lighting. Clear any walking paths of anything that might make someone trip, such as rocks or tools. Regularly check to see if handrails are loose or broken. Make sure that both sides of any steps have handrails. Any raised decks and porches should have guardrails on the edges. Have any leaves, snow, or ice cleared regularly. Use sand or salt on walking paths during winter. Clean up any spills in your garage right away. This includes oil or grease spills. What can I do in the bathroom? Use night lights. Install grab bars by the toilet and in the tub and shower. Do not use towel bars as grab bars. Use non-skid mats or decals in the tub or shower. If you need to sit down in the shower, use a plastic, non-slip stool. Keep the floor dry. Clean up any water that spills on the floor as soon as it happens. Remove soap buildup in the tub or shower regularly. Attach bath mats securely with double-sided non-slip rug tape. Do not have throw rugs and other things on the floor that can make you trip. What can I do in the bedroom? Use night lights. Make sure that you have a light by your  bed that is easy to reach. Do not use any sheets or blankets that are too big for your bed. They should not hang down onto the floor. Have a firm chair that has side arms. You can use this for support while you get dressed. Do not have throw rugs and other things on the floor that can make you trip. What can I do in the kitchen? Clean up any spills right away. Avoid walking on wet floors. Keep items that you use a lot in easy-to-reach places. If you need to reach something above you, use a strong step stool that has a grab bar. Keep electrical cords out of the way. Do not use floor polish or wax that makes floors slippery. If you must use wax, use non-skid floor wax. Do not have throw rugs and other things on the floor that can make you trip. What can I do with my stairs? Do not leave any items on the stairs. Make sure that there are handrails on both sides of the stairs and use them. Fix handrails that are broken or loose. Make sure that handrails are as long as the stairways. Check any carpeting to make sure that it is firmly attached to the stairs. Fix any carpet that is loose or worn. Avoid having throw rugs at the top or bottom of the stairs. If you do have throw rugs, attach them  to the floor with carpet tape. Make sure that you have a light switch at the top of the stairs and the bottom of the stairs. If you do not have them, ask someone to add them for you. What else can I do to help prevent falls? Wear shoes that: Do not have high heels. Have rubber bottoms. Are comfortable and fit you well. Are closed at the toe. Do not wear sandals. If you use a stepladder: Make sure that it is fully opened. Do not climb a closed stepladder. Make sure that both sides of the stepladder are locked into place. Ask someone to hold it for you, if possible. Clearly mark and make sure that you can see: Any grab bars or handrails. First and last steps. Where the edge of each step is. Use tools that  help you move around (mobility aids) if they are needed. These include: Canes. Walkers. Scooters. Crutches. Turn on the lights when you go into a dark area. Replace any light bulbs as soon as they burn out. Set up your furniture so you have a clear path. Avoid moving your furniture around. If any of your floors are uneven, fix them. If there are any pets around you, be aware of where they are. Review your medicines with your doctor. Some medicines can make you feel dizzy. This can increase your chance of falling. Ask your doctor what other things that you can do to help prevent falls. This information is not intended to replace advice given to you by your health care provider. Make sure you discuss any questions you have with your health care provider. Document Released: 06/28/2009 Document Revised: 02/07/2016 Document Reviewed: 10/06/2014 Elsevier Interactive Patient Education  2017 [employer].

## 2022-08-12 LAB — CBC WITH DIFFERENTIAL/PLATELET
Absolute Monocytes: 334 cells/uL (ref 200–950)
Basophils Absolute: 71 cells/uL (ref 0–200)
Basophils Relative: 1.5 %
Eosinophils Absolute: 522 cells/uL — ABNORMAL HIGH (ref 15–500)
Eosinophils Relative: 11.1 %
HCT: 46.4 % (ref 38.5–50.0)
Hemoglobin: 16.2 g/dL (ref 13.2–17.1)
Lymphs Abs: 1936 cells/uL (ref 850–3900)
MCH: 31.2 pg (ref 27.0–33.0)
MCHC: 34.9 g/dL (ref 32.0–36.0)
MCV: 89.4 fL (ref 80.0–100.0)
MPV: 10.7 fL (ref 7.5–12.5)
Monocytes Relative: 7.1 %
Neutro Abs: 1838 cells/uL (ref 1500–7800)
Neutrophils Relative %: 39.1 %
Platelets: 242 10*3/uL (ref 140–400)
RBC: 5.19 10*6/uL (ref 4.20–5.80)
RDW: 12.4 % (ref 11.0–15.0)
Total Lymphocyte: 41.2 %
WBC: 4.7 10*3/uL (ref 3.8–10.8)

## 2022-08-12 LAB — HEMOGLOBIN A1C
Hgb A1c MFr Bld: 6.4 % of total Hgb — ABNORMAL HIGH (ref <5.7)
Mean Plasma Glucose: 137 mg/dL
eAG (mmol/L): 7.6 mmol/L

## 2022-08-12 LAB — TSH: TSH: 2.27 mIU/L (ref 0.40–4.50)

## 2022-08-12 LAB — COMPLETE METABOLIC PANEL WITH GFR
AG Ratio: 1.5 (calc) (ref 1.0–2.5)
ALT: 12 U/L (ref 9–46)
AST: 12 U/L (ref 10–35)
Albumin: 4.4 g/dL (ref 3.6–5.1)
Alkaline phosphatase (APISO): 68 U/L (ref 35–144)
BUN: 20 mg/dL (ref 7–25)
CO2: 28 mmol/L (ref 20–32)
Calcium: 10.2 mg/dL (ref 8.6–10.3)
Chloride: 104 mmol/L (ref 98–110)
Creat: 1.22 mg/dL (ref 0.70–1.28)
Globulin: 3 g/dL (calc) (ref 1.9–3.7)
Glucose, Bld: 137 mg/dL — ABNORMAL HIGH (ref 65–99)
Potassium: 4.3 mmol/L (ref 3.5–5.3)
Sodium: 142 mmol/L (ref 135–146)
Total Bilirubin: 0.6 mg/dL (ref 0.2–1.2)
Total Protein: 7.4 g/dL (ref 6.1–8.1)
eGFR: 62 mL/min/{1.73_m2} (ref >=60)

## 2022-08-12 LAB — LIPID PANEL
Cholesterol: 168 mg/dL (ref <200)
HDL: 51 mg/dL (ref >=40)
LDL Cholesterol (Calc): 101 mg/dL (calc) — ABNORMAL HIGH
Non-HDL Cholesterol (Calc): 117 mg/dL (calc) (ref <130)
Total CHOL/HDL Ratio: 3.3 (calc) (ref <5.0)
Triglycerides: 71 mg/dL (ref <150)

## 2022-08-12 LAB — PSA: PSA: 1.76 ng/mL (ref <=4.00)

## 2022-08-12 NOTE — Progress Notes (Signed)
I have reviewed this visit and agree with the documentation.   

## 2022-08-20 ENCOUNTER — Ambulatory Visit (INDEPENDENT_AMBULATORY_CARE_PROVIDER_SITE_OTHER): Payer: Medicare Other | Admitting: Internal Medicine

## 2022-08-20 ENCOUNTER — Encounter: Payer: Self-pay | Admitting: Internal Medicine

## 2022-08-20 VITALS — BP 172/89 | HR 75 | Temp 97.6°F | Ht 69.0 in | Wt 200.7 lb

## 2022-08-20 DIAGNOSIS — E063 Autoimmune thyroiditis: Secondary | ICD-10-CM

## 2022-08-20 DIAGNOSIS — I10 Essential (primary) hypertension: Secondary | ICD-10-CM | POA: Diagnosis not present

## 2022-08-20 DIAGNOSIS — M1711 Unilateral primary osteoarthritis, right knee: Secondary | ICD-10-CM

## 2022-08-20 DIAGNOSIS — E038 Other specified hypothyroidism: Secondary | ICD-10-CM

## 2022-08-20 DIAGNOSIS — E785 Hyperlipidemia, unspecified: Secondary | ICD-10-CM

## 2022-08-20 NOTE — Assessment & Plan Note (Signed)
Chronic problem. 

## 2022-08-20 NOTE — Addendum Note (Signed)
Addended by: Angus Seller on: 08/20/2022 12:26 PM   Modules accepted: Orders

## 2022-08-20 NOTE — Progress Notes (Signed)
Established Patient Office Visit  Subjective:  Patient ID: Jamarie Mussa, male    DOB: 22-Jan-1946  Age: 76 y.o. MRN: [id number]  CC:  Chief Complaint  Patient presents with   Results    Wants to discuss labs     HPI  Alam Guterrez presents for check up  Past Medical History:  Diagnosis Date   Diabetes mellitus without complication ([place])    Hypertension    Thyroid disease     Past Surgical History:  Procedure Laterality Date   COLONOSCOPY WITH PROPOFOL     COLONOSCOPY WITH PROPOFOL N/A 10/15/2021   Procedure: COLONOSCOPY WITH PROPOFOL;  Surgeon: Jonathon Bellows, MD;  Location: [facility] ENDOSCOPY;  Service: Gastroenterology;  Laterality: N/A;   HERNIA REPAIR     INSERTION OF MESH  08/27/2021   Procedure: INSERTION OF MESH;  Surgeon: Jules Husbands, MD;  Location: [facility] ORS;  Service: General;;    Family History  Problem Relation Age of Onset   COPD Mother    Heart disease Father     Social History   Socioeconomic History   Marital status: Single    Spouse name: Not on file   Number of children: Not on file   Years of education: Not on file   Highest education level: Not on file  Occupational History   Not on file  Tobacco Use   Smoking status: Never   Smokeless tobacco: Never  Vaping Use   Vaping Use: Never used  Substance and Sexual Activity   Alcohol use: Never   Drug use: Never   Sexual activity: Not on file  Other Topics Concern   Not on file  Social History Narrative   Not on file   Social Determinants of Health   Financial Resource Strain: Low Risk  (08/11/2022)   Overall Financial Resource Strain (CARDIA)    Difficulty of Paying Living Expenses: Not hard at all  Food Insecurity: No Food Insecurity (08/11/2022)   Hunger Vital Sign    Worried About Running Out of Food in the Last Year: Never true    [place] in the Last Year: Never true  Transportation Needs: No Transportation Needs (08/11/2022)   PRAPARE - Radiographer, therapeutic (Medical): No    Lack of Transportation (Non-Medical): No  Physical Activity: Inactive (08/11/2022)   Exercise Vital Sign    Days of Exercise per Week: 0 days    Minutes of Exercise per Session: 0 min  Stress: No Stress Concern Present (08/11/2022)   [place]    Feeling of Stress : Only a little  Social Connections: Socially Isolated (08/11/2022)   Social Connection and Isolation Panel [NHANES]    Frequency of Communication with Friends and Family: Twice a week    Frequency of Social Gatherings with Friends and Family: Once a week    Attends Religious Services: Never    Marine scientist or Organizations: No    Attends Archivist Meetings: Never    Marital Status: Divorced  Human resources officer Violence: Not At Risk (08/11/2022)   Humiliation, Afraid, Rape, and Kick questionnaire    Fear of Current or Ex-Partner: No    Emotionally Abused: No    Physically Abused: No    Sexually Abused: No     Current Outpatient Medications:    Alpha-Lipoic Acid 100 MG CAPS, Take 1 capsule (100 mg total) by mouth daily., Disp: 90 capsule, Rfl: 3  aspirin EC 81 MG tablet, Take 81 mg by mouth daily., Disp: , Rfl:    cholecalciferol (VITAMIN D3) 25 MCG (1000 UNIT) tablet, Take 1,000 Units by mouth daily., Disp: , Rfl:    levothyroxine (SYNTHROID) 50 MCG tablet, TAKE 1 TABLET BY MOUTH EVERY DAY, Disp: 90 tablet, Rfl: 2   lisinopril (ZESTRIL) 40 MG tablet, TAKE 1 TABLET BY MOUTH EVERY DAY, Disp: 90 tablet, Rfl: 3   metFORMIN (GLUCOPHAGE) 500 MG tablet, TAKE 1 TABLET BY MOUTH 2 TIMES DAILY WITH A MEAL., Disp: 180 tablet, Rfl: 3   Multiple Vitamins-Minerals (CENTRUM SILVER 50+MEN PO), Take 1 tablet by mouth daily., Disp: , Rfl:    simvastatin (ZOCOR) 20 MG tablet, Take 1 tablet (20 mg total) by mouth daily., Disp: 90 tablet, Rfl: 3   No Known Allergies  ROS Review of Systems  Constitutional: Negative.    HENT: Negative.    Eyes: Negative.   Respiratory: Negative.    Cardiovascular: Negative.   Gastrointestinal: Negative.   Endocrine: Negative.   Genitourinary: Negative.   Musculoskeletal: Negative.   Skin: Negative.   Allergic/Immunologic: Negative.   Neurological: Negative.   Hematological: Negative.   Psychiatric/Behavioral: Negative.    All other systems reviewed and are negative.     Objective:    Physical Exam Vitals reviewed.  Constitutional:      Appearance: Normal appearance.  HENT:     Mouth/Throat:     Mouth: Mucous membranes are moist.  Eyes:     Pupils: Pupils are equal, round, and reactive to light.  Neck:     Vascular: No carotid bruit.  Cardiovascular:     Rate and Rhythm: Normal rate and regular rhythm.     Pulses: Normal pulses.     Heart sounds: Normal heart sounds.  Pulmonary:     Effort: Pulmonary effort is normal.     Breath sounds: Normal breath sounds.  Abdominal:     General: Bowel sounds are normal.     Palpations: Abdomen is soft. There is no hepatomegaly, splenomegaly or mass.     Tenderness: There is no abdominal tenderness.     Hernia: No hernia is present.  Musculoskeletal:     Cervical back: Neck supple.     Right lower leg: No edema.     Left lower leg: No edema.  Skin:    Findings: No rash.  Neurological:     Mental Status: He is alert and oriented to person, place, and time.     Motor: No weakness.  Psychiatric:        Mood and Affect: Mood normal.        Behavior: Behavior normal.     BP (!) 172/89   Pulse 75   Temp 97.6 F (36.4 C) (Temporal)   Ht _0  (1.753 m)   Wt 200 lb 11.2 oz (91 kg)   SpO2 98%   BMI 29.64 kg/m  Wt Readings from Last 3 Encounters:  08/20/22 200 lb 11.2 oz (91 kg)  08/11/22 196 lb (88.9 kg)  05/12/22 196 lb 3.2 oz (89 kg)     Health Maintenance Due  Topic Date Due   Diabetic kidney evaluation - Urine ACR  Never done   Hepatitis C Screening  Never done    There are no preventive  care reminders to display for this patient.  Lab Results  Component Value Date   TSH 2.27 08/11/2022   Lab Results  Component Value Date   WBC 4.7 08/11/2022  HGB 16.2 08/11/2022   HCT 46.4 08/11/2022   MCV 89.4 08/11/2022   PLT 242 08/11/2022   Lab Results  Component Value Date   NA 142 08/11/2022   K 4.3 08/11/2022   CO2 28 08/11/2022   GLUCOSE 137 (H) 08/11/2022   BUN 20 08/11/2022   CREATININE 1.22 08/11/2022   BILITOT 0.6 08/11/2022   AST 12 08/11/2022   ALT 12 08/11/2022   PROT 7.4 08/11/2022   CALCIUM 10.2 08/11/2022   ANIONGAP 5 08/27/2021   EGFR 62 08/11/2022   Lab Results  Component Value Date   CHOL 168 08/11/2022   Lab Results  Component Value Date   HDL 51 08/11/2022   Lab Results  Component Value Date   LDLCALC 101 (H) 08/11/2022   Lab Results  Component Value Date   TRIG 71 08/11/2022   Lab Results  Component Value Date   CHOLHDL 3.3 08/11/2022   Lab Results  Component Value Date   HGBA1C 6.4 (H) 08/11/2022      Assessment & Plan:   Problem List Items Addressed This Visit       Cardiovascular and Mediastinum   Essential hypertension - Primary     Patient denies any chest pain or shortness of breath there is no history of palpitation or paroxysmal nocturnal dyspnea   patient was advised to follow low-salt low-cholesterol diet    ideally I want to keep systolic blood pressure below 130 mmHg, patient was asked to check blood pressure one times a week and give me a report on that.  Patient will be follow-up in 3 months  or earlier as needed, patient will call me back for any change in the cardiovascular symptoms Patient was advised to buy a book from local bookstore concerning blood pressure and read several chapters  every day.  This will be supplemented by some of the material we will give him from the office.  Patient should also utilize other resources like YouTube and Internet to learn more about the blood pressure and the diet.         Endocrine   Hypothyroidism due to Hashimoto's thyroiditis     Musculoskeletal and Integument   Primary osteoarthritis of right knee    Chronic problem        Other   Dyslipidemia    Hypercholesterolemia  I advised the patient to follow Mediterranean diet This diet is rich in fruits vegetables and whole grain, and This diet is also rich in fish and lean meat Patient should also eat a handful of almonds or walnuts daily Recent heart study indicated that average follow-up on this kind of diet reduces the cardiovascular mortality by 50 to 70%==      Labs reviewed with the patient No orders of the defined types were placed in this encounter.   Follow-up: No follow-ups on file.    Cletis Athens, MD

## 2022-08-20 NOTE — Assessment & Plan Note (Signed)

## 2022-08-20 NOTE — Assessment & Plan Note (Signed)
Hypercholesterolemia  I advised the patient to follow Mediterranean diet This diet is rich in fruits vegetables and whole grain, and This diet is also rich in fish and lean meat Patient should also eat a handful of almonds or walnuts daily Recent heart study indicated that average follow-up on this kind of diet reduces the cardiovascular mortality by 50 to 70%== 

## 2022-09-13 ENCOUNTER — Other Ambulatory Visit: Payer: Self-pay | Admitting: Internal Medicine

## 2022-09-13 DIAGNOSIS — E039 Hypothyroidism, unspecified: Secondary | ICD-10-CM

## 2022-11-19 ENCOUNTER — Ambulatory Visit: Payer: Medicare Other | Admitting: Internal Medicine

## 2023-05-14 NOTE — Progress Notes (Deleted)
Referring Physician:  No referring provider defined for this encounter.  Primary Physician:  Corky Downs, MD  History of Present Illness: 05/14/2023*** Mr. Piero Mcadory has a history of DM, HTN, thyroid disease.        Duration: *** Location: *** Quality: *** Severity: ***  Precipitating: aggravated by *** Modifying factors: made better by *** Weakness: none Timing: *** Bowel/Bladder Dysfunction: none  Conservative measures:  Physical therapy: ***  Multimodal medical therapy including regular antiinflammatories: ***  Injections: *** epidural steroid injections  Past Surgery: ***  Buel Bernardi has ***no symptoms of cervical myelopathy.  The symptoms are causing a significant impact on the patient's life.   Review of Systems:  A 10 point review of systems is negative, except for the pertinent positives and negatives detailed in the HPI.  Past Medical History: Past Medical History:  Diagnosis Date   Diabetes mellitus without complication (HCC)    Hypertension    Thyroid disease     Past Surgical History: Past Surgical History:  Procedure Laterality Date   COLONOSCOPY WITH PROPOFOL     COLONOSCOPY WITH PROPOFOL N/A 10/15/2021   Procedure: COLONOSCOPY WITH PROPOFOL;  Surgeon: Wyline Mood, MD;  Location: [facility] ENDOSCOPY;  Service: Gastroenterology;  Laterality: N/A;   HERNIA REPAIR     INSERTION OF MESH  08/27/2021   Procedure: INSERTION OF MESH;  Surgeon: Leafy Ro, MD;  Location: [facility] ORS;  Service: General;;    Allergies: Allergies as of 05/22/2023   (No Known Allergies)    Medications: Outpatient Encounter Medications as of 05/22/2023  Medication Sig   Alpha-Lipoic Acid 100 MG CAPS Take 1 capsule (100 mg total) by mouth daily.   aspirin EC 81 MG tablet Take 81 mg by mouth daily.   cholecalciferol (VITAMIN D3) 25 MCG (1000 UNIT) tablet Take 1,000 Units by mouth daily.   levothyroxine (SYNTHROID) 50 MCG tablet TAKE 1 TABLET BY MOUTH EVERY DAY    lisinopril (ZESTRIL) 40 MG tablet TAKE 1 TABLET BY MOUTH EVERY DAY   metFORMIN (GLUCOPHAGE) 500 MG tablet TAKE 1 TABLET BY MOUTH 2 TIMES DAILY WITH A MEAL.   Multiple Vitamins-Minerals (CENTRUM SILVER 50+MEN PO) Take 1 tablet by mouth daily.   simvastatin (ZOCOR) 20 MG tablet TAKE 1 TABLET BY MOUTH EVERY DAY   No facility-administered encounter medications on file as of 05/22/2023.    Social History: Social History   Tobacco Use   Smoking status: Never   Smokeless tobacco: Never  Vaping Use   Vaping status: Never Used  Substance Use Topics   Alcohol use: Never   Drug use: Never    Family Medical History: Family History  Problem Relation Age of Onset   COPD Mother    Heart disease Father     Physical Examination: There were no vitals filed for this visit.  General: Patient is well developed, well nourished, calm, collected, and in no apparent distress. Attention to examination is appropriate.  Respiratory: Patient is breathing without any difficulty.   NEUROLOGICAL:     Awake, alert, oriented to person, place, and time.  Speech is clear and fluent. Fund of knowledge is appropriate.   Cranial Nerves: Pupils equal round and reactive to light.  Facial tone is symmetric.    *** ROM of cervical spine *** pain *** posterior cervical tenderness. *** tenderness in bilateral trapezial region.   *** ROM of lumbar spine *** pain *** posterior lumbar tenderness.   No abnormal lesions on exposed skin.   Strength: Side  Biceps Triceps Deltoid Interossei Grip Wrist Ext. Wrist Flex.  R 5 5 5 5 5 5 5   L 5 5 5 5 5 5 5    Side Iliopsoas Quads Hamstring PF DF EHL  R 5 5 5 5 5 5   L 5 5 5 5 5 5    Reflexes are ***2+ and symmetric at the biceps, brachioradialis, patella and achilles.   Hoffman's is absent.  Clonus is not present.   Bilateral upper and lower extremity sensation is intact to light touch.     Gait is normal.   ***No difficulty with tandem gait.    Medical Decision  Making  Imaging: No recent lumbar imaging.   I have personally reviewed the images and agree with the above interpretation.***  Assessment and Plan: Mr. Roosevelt is a pleasant 77 y.o. male has ***  Treatment options discussed with patient and following plan made:   - Order for physical therapy for *** spine ***. Patient to call to schedule appointment. *** - Continue current medications including ***. Reviewed dosing and side effects.  - Prescription for ***. Reviewed dosing and side effects. Take with food.  - Prescription for *** to take prn muscle spasms. Reviewed dosing and side effects. Discussed this can cause drowsiness.  - MRI of *** to further evaluate *** radiculopathy. No improvement time or medications (***).  - Referral to PMR at [facility] to discuss possible *** injections.  - Will schedule phone visit to review MRI results once I get them back.   I spent a total of *** minutes in face-to-face and non-face-to-face activities related to this patient's care today including review of outside records, review of imaging, review of symptoms, physical exam, discussion of differential diagnosis, discussion of treatment options, and documentation.   Thank you for involving me in the care of this patient.   Drake Leach PA-C Dept. of Neurosurgery

## 2023-05-19 ENCOUNTER — Ambulatory Visit: Payer: Medicare Other | Admitting: Neurosurgery

## 2023-05-22 ENCOUNTER — Ambulatory Visit: Payer: Medicare Other | Admitting: Orthopedic Surgery

## 2023-05-22 NOTE — Progress Notes (Unsigned)
Referring Physician:  Corky Downs, MD [address] [place],  [place] [address]  Primary Physician:  Corky Downs, MD  History of Present Illness: 05/28/2023 [name] has a history of DM, HTN, thyroid disease.   2 year history of constant numbness in both feet along with legs feeling heavy. He notes some balance issues since he cannot feel his feet. He has some intermittent LBP that is tolerable. Numbness in feet gets worse with standing and walking.   Bowel/Bladder Dysfunction: none  Conservative measures:  Physical therapy: has done PT by [facility], discharged 08/17/2018 Multimodal medical therapy including regular antiinflammatories: none  Injections: has not had epidural steroid injections  Past Surgery: no  Adam Castillo has no symptoms of cervical myelopathy.  The symptoms are causing a significant impact on the patient's life.   Review of Systems:  A 10 point review of systems is negative, except for the pertinent positives and negatives detailed in the HPI.  Past Medical History: Past Medical History:  Diagnosis Date   Diabetes mellitus without complication (HCC)    Hypertension    Thyroid disease     Past Surgical History: Past Surgical History:  Procedure Laterality Date   COLONOSCOPY WITH PROPOFOL     COLONOSCOPY WITH PROPOFOL N/A 10/15/2021   Procedure: COLONOSCOPY WITH PROPOFOL;  Surgeon: Wyline Mood, MD;  Location: [facility] ENDOSCOPY;  Service: Gastroenterology;  Laterality: N/A;   HERNIA REPAIR     INSERTION OF MESH  08/27/2021   Procedure: INSERTION OF MESH;  Surgeon: Leafy Ro, MD;  Location: [facility] ORS;  Service: General;;    Allergies: Allergies as of 05/28/2023   (No Known Allergies)    Medications: Outpatient Encounter Medications as of 05/28/2023  Medication Sig   Alpha-Lipoic Acid 100 MG CAPS Take 1 capsule (100 mg total) by mouth daily.   aspirin EC 81 MG tablet Take 81 mg by mouth daily.   cholecalciferol (VITAMIN D3) 25 MCG  (1000 UNIT) tablet Take 1,000 Units by mouth daily.   furosemide (LASIX) 80 MG tablet Take 80 mg by mouth daily.   levothyroxine (SYNTHROID) 50 MCG tablet TAKE 1 TABLET BY MOUTH EVERY DAY   lisinopril (ZESTRIL) 40 MG tablet TAKE 1 TABLET BY MOUTH EVERY DAY   metFORMIN (GLUCOPHAGE) 500 MG tablet TAKE 1 TABLET BY MOUTH 2 TIMES DAILY WITH A MEAL.   Multiple Vitamins-Minerals (CENTRUM SILVER 50+MEN PO) Take 1 tablet by mouth daily.   potassium chloride (MICRO-K) 10 MEQ CR capsule Take 10 mEq by mouth daily.   simvastatin (ZOCOR) 20 MG tablet TAKE 1 TABLET BY MOUTH EVERY DAY   No facility-administered encounter medications on file as of 05/28/2023.    Social History: Social History   Tobacco Use   Smoking status: Never   Smokeless tobacco: Never  Vaping Use   Vaping status: Never Used  Substance Use Topics   Alcohol use: Never   Drug use: Never    Family Medical History: Family History  Problem Relation Age of Onset   COPD Mother    Heart disease Father     Physical Examination: Vitals:   05/28/23 1011 05/28/23 1028  BP: (!) 148/86 (!) 142/82    General: Patient is well developed, well nourished, calm, collected, and in no apparent distress. Attention to examination is appropriate.  Respiratory: Patient is breathing without any difficulty.   NEUROLOGICAL:     Awake, alert, oriented to person, place, and time.  Speech is clear and fluent. Fund of knowledge  is appropriate.   Cranial Nerves: Pupils equal round and reactive to light.  Facial tone is symmetric.    No posterior lumbar tenderness.   No abnormal lesions on exposed skin.   Strength: Side Biceps Triceps Deltoid Interossei Grip Wrist Ext. Wrist Flex.  R 5 5 5 5 5 5 5   L 5 5 5 5 5 5 5    Side Iliopsoas Quads Hamstring PF DF EHL  R 5 5 5 5 5 5   L 5 5 5 5 5 5    Reflexes are 2+ and symmetric at the biceps, brachioradialis, patella and achilles.   Hoffman's is absent.  Clonus is not present.   Bilateral upper  and lower extremity sensation is intact to light touch.     Gait is slow with slight limp.   Medical Decision Making  Imaging: No recent lumbar imaging.   Assessment and Plan: Adam Castillo is a pleasant 77 y.o. male who has a 2 year history of constant numbness in both feet along with legs feeling heavy. He notes some balance issues since he cannot feel his feet. He has some intermittent LBP that is tolerable. Numbness in feet gets worse with standing and walking.   No imaging available.   Treatment options discussed with patient and following plan made:   - EMG/NCS of bilateral lower extremities to evaluate numbness in feet. Orders to neurology at [facility].  - May consider lumbar MRI depending on results of EMG.  - He will let us know when EMG is done so we can get results. Will likely do phone visit to review them.   I spent a total of 25 minutes in face-to-face and non-face-to-face activities related to this patient's care today including review of outside records, review of imaging, review of symptoms, physical exam, discussion of differential diagnosis, discussion of treatment options, and documentation.   Thank you for involving me in the care of this patient.   Drake Leach PA-C Dept. of Neurosurgery

## 2023-05-28 ENCOUNTER — Ambulatory Visit (INDEPENDENT_AMBULATORY_CARE_PROVIDER_SITE_OTHER): Payer: Medicare Other | Admitting: Orthopedic Surgery

## 2023-05-28 ENCOUNTER — Telehealth: Payer: Self-pay | Admitting: Orthopedic Surgery

## 2023-05-28 ENCOUNTER — Encounter: Payer: Self-pay | Admitting: Orthopedic Surgery

## 2023-05-28 VITALS — BP 142/82 | Ht 69.0 in | Wt 189.0 lb

## 2023-05-28 DIAGNOSIS — R200 Anesthesia of skin: Secondary | ICD-10-CM

## 2023-05-28 NOTE — Patient Instructions (Signed)
It was so nice to see you today. Thank you so much for coming in.    I want to get an EMG (nerve conduction test) to look into things further. I have ordered this and [facility] neurology will call you to schedule it. You can all them at [phone].   Please call and let me know when you've had the test done so I can get the results.   Once I have the results, we can do a phone or in person visit to review.   Please do not hesitate to call if you have any questions or concerns. You can also message me in MyChart.    Drake Leach PA-C [phone]

## 2023-05-28 NOTE — Telephone Encounter (Signed)
Referral to neurology at [facility] for EMG Of bilateral lower extremities.

## 2023-05-29 NOTE — Telephone Encounter (Signed)
Referral faxed on 05/28/2023

## 2023-06-10 NOTE — Telephone Encounter (Signed)
Appt 07/07/2023

## 2023-07-16 NOTE — Telephone Encounter (Signed)
No results in chart yet.

## 2023-07-21 NOTE — Telephone Encounter (Signed)
No results yet

## 2023-07-24 NOTE — Progress Notes (Unsigned)
Referring Physician:  Corky Downs, MD [address] [place],  [place] [address]  Primary Physician:  Corky Downs, MD  History of Present Illness: [name] has a history of DM, HTN, thyroid disease.   Last seen by me on 05/28/23 for constant numbness in feet and heavy feelings in legs.   EMG was ordered and he is here to review it.   He continues with constant numbness in both feet along with legs feeling heavy. He notes some balance issues since he cannot feel his feet. He has some intermittent LBP that is tolerable. Numbness in feet gets worse with standing and walking.   Thinks some of the numbness may be better since starting vitamin B.   Bowel/Bladder Dysfunction: none  Conservative measures:  Physical therapy: has done PT by [facility], discharged 08/17/2018 Multimodal medical therapy including regular antiinflammatories: none  Injections: has not had epidural steroid injections  Past Surgery: no  Jillyn Hidden Szabo has no symptoms of cervical myelopathy.  The symptoms are causing a significant impact on the patient's life.   Review of Systems:  A 10 point review of systems is negative, except for the pertinent positives and negatives detailed in the HPI.  Past Medical History: Past Medical History:  Diagnosis Date   Diabetes mellitus without complication (HCC)    Hypertension    Thyroid disease     Past Surgical History: Past Surgical History:  Procedure Laterality Date   COLONOSCOPY WITH PROPOFOL     COLONOSCOPY WITH PROPOFOL N/A 10/15/2021   Procedure: COLONOSCOPY WITH PROPOFOL;  Surgeon: Wyline Mood, MD;  Location: [facility] ENDOSCOPY;  Service: Gastroenterology;  Laterality: N/A;   HERNIA REPAIR     INSERTION OF MESH  08/27/2021   Procedure: INSERTION OF MESH;  Surgeon: Leafy Ro, MD;  Location: [facility] ORS;  Service: General;;    Allergies: Allergies as of 07/27/2023   (No Known Allergies)    Medications: Outpatient Encounter Medications as of  07/27/2023  Medication Sig   Alpha-Lipoic Acid 100 MG CAPS Take 1 capsule (100 mg total) by mouth daily.   aspirin EC 81 MG tablet Take 81 mg by mouth daily.   cholecalciferol (VITAMIN D3) 25 MCG (1000 UNIT) tablet Take 1,000 Units by mouth daily.   furosemide (LASIX) 80 MG tablet Take 80 mg by mouth daily.   levothyroxine (SYNTHROID) 50 MCG tablet TAKE 1 TABLET BY MOUTH EVERY DAY   lisinopril (ZESTRIL) 40 MG tablet TAKE 1 TABLET BY MOUTH EVERY DAY   metFORMIN (GLUCOPHAGE) 500 MG tablet TAKE 1 TABLET BY MOUTH 2 TIMES DAILY WITH A MEAL.   Multiple Vitamins-Minerals (CENTRUM SILVER 50+MEN PO) Take 1 tablet by mouth daily.   potassium chloride (MICRO-K) 10 MEQ CR capsule Take 10 mEq by mouth daily.   simvastatin (ZOCOR) 20 MG tablet TAKE 1 TABLET BY MOUTH EVERY DAY   No facility-administered encounter medications on file as of 07/27/2023.    Social History: Social History   Tobacco Use   Smoking status: Never   Smokeless tobacco: Never  Vaping Use   Vaping status: Never Used  Substance Use Topics   Alcohol use: Never   Drug use: Never    Family Medical History: Family History  Problem Relation Age of Onset   COPD Mother    Heart disease Father     Physical Examination: There were no vitals filed for this visit.    Awake, alert, oriented to person, place, and time.  Speech is clear and fluent. Fund  of knowledge is appropriate.   Cranial Nerves: Pupils equal round and reactive to light.  Facial tone is symmetric.    No abnormal lesions on exposed skin.   Strength:  Side Iliopsoas Quads Hamstring PF DF EHL  R 5 5 5 5 5 5   L 5 5 5 5 5 5    Reflexes are 2+ and symmetric at the patella and achilles.    Clonus is not present.   Bilateral lower extremity sensation is intact to light touch.     Gait is slow with slight limp.   Medical Decision Making  Imaging: EMG of bilateral lower extremities dated 07/07/23:  Impression: Abnormal study. 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  Thank you for the referral of this patient. It was our privilege to participate in care of your patient. Feel free to contact us with any further questions.  _____________________________ Theora Master, M.D.    Assessment and Plan: Mr. Wilbur is a pleasant 77 y.o. male who has a 2 year history of constant numbness in both feet along with legs feeling heavy. He notes some balance issues since he cannot feel his feet.   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  Treatment options discussed with patient and following plan made:   - Referral to neurology at [facility] for treatment of polyneuropathy.  - Discussed trial of neurontin, he declines.  - Would hold on further lumbar imaging as he has only intermittent LBP that is tolerable.  - Happy to see him back if he develops worsening LBP or radicular pain. He will f/u prn.   BP was elevated. No symptoms of chest pain, shortness of breath, blurry vision, or headaches. He checks BP at home and it generally runs 140s/80s. Will recheck at home and call PCP if not improved. If he develops CP, SOB, blurry vision, or headaches, then he will go to ED.     I spent a total of 15 minutes in face-to-face and non-face-to-face activities related to this patient's care today including review of outside records, review of imaging, review of symptoms, physical exam, discussion of differential diagnosis, discussion of treatment options, and documentation.   Drake Leach PA-C Dept. of Neurosurgery

## 2023-07-27 ENCOUNTER — Ambulatory Visit (INDEPENDENT_AMBULATORY_CARE_PROVIDER_SITE_OTHER): Payer: Medicare Other | Admitting: Orthopedic Surgery

## 2023-07-27 ENCOUNTER — Encounter: Payer: Self-pay | Admitting: Orthopedic Surgery

## 2023-07-27 VITALS — BP 146/88 | Ht 69.0 in | Wt 189.0 lb

## 2023-07-27 DIAGNOSIS — G629 Polyneuropathy, unspecified: Secondary | ICD-10-CM

## 2023-07-27 DIAGNOSIS — M545 Low back pain, unspecified: Secondary | ICD-10-CM

## 2023-07-27 NOTE — Telephone Encounter (Signed)
His EMG results are in his chart.

## 2023-07-27 NOTE — Telephone Encounter (Signed)
Left message to call back  

## 2023-07-27 NOTE — Telephone Encounter (Signed)
Please schedule phone visit with me to review his EMG results.

## 2023-07-27 NOTE — Patient Instructions (Signed)
It was so nice to see you today. Thank you so much for coming in.    Your nerve test showed a polyneuropathy in your legs. I think this is causing the numbness and feelings of heaviness.   I want you to see neurology at the [facility] clinic for further evaluation of the neuropathy. They should call you to schedule an appointment or you can call them at [phone].   I will see you back as needed. Please do not hesitate to call if you have any questions or concerns. You can also message me in MyChart.   Your blood pressure was elevated today. I want you to recheck it at home and follow up with your PCP if it remains high. If you have any chest pain, shortness of breath, blurry vision, or headaches then you need to go to ED.    Drake Leach PA-C [phone]     The physicians and staff at [facility] at [facility] are committed to providing excellent care. You may receive a survey asking for feedback about your experience at our office. We value you your feedback and appreciate you taking the time to to fill it out. The [facility] leadership team is also available to discuss your experience in person, feel free to contact us [phone].

## 2023-07-28 NOTE — Telephone Encounter (Signed)
Patient was seen on 07/27/2023

## 2023-08-17 ENCOUNTER — Ambulatory Visit
Admission: RE | Admit: 2023-08-17 | Discharge: 2023-08-17 | Disposition: A | Discharge status: Home / Self Care | Payer: Medicare Other | Source: Ambulatory Visit | Attending: Internal Medicine | Admitting: Internal Medicine

## 2023-08-17 ENCOUNTER — Other Ambulatory Visit: Payer: Self-pay | Admitting: Internal Medicine

## 2023-08-17 DIAGNOSIS — R0602 Shortness of breath: Secondary | ICD-10-CM

## 2023-12-21 ENCOUNTER — Ambulatory Visit: Payer: Self-pay | Admitting: Physician Assistant

## 2023-12-21 ENCOUNTER — Encounter: Payer: Self-pay | Admitting: Physician Assistant

## 2023-12-21 VITALS — BP 150/80 | HR 80 | Temp 98.1°F | Resp 16 | Ht 69.0 in | Wt 200.4 lb

## 2023-12-21 DIAGNOSIS — I10 Essential (primary) hypertension: Secondary | ICD-10-CM | POA: Diagnosis not present

## 2023-12-21 DIAGNOSIS — E063 Autoimmune thyroiditis: Secondary | ICD-10-CM

## 2023-12-21 DIAGNOSIS — R5383 Other fatigue: Secondary | ICD-10-CM

## 2023-12-21 DIAGNOSIS — E538 Deficiency of other specified B group vitamins: Secondary | ICD-10-CM | POA: Diagnosis not present

## 2023-12-21 DIAGNOSIS — E782 Mixed hyperlipidemia: Secondary | ICD-10-CM

## 2023-12-21 DIAGNOSIS — R7303 Prediabetes: Secondary | ICD-10-CM | POA: Diagnosis not present

## 2023-12-21 DIAGNOSIS — N1831 Chronic kidney disease, stage 3a: Secondary | ICD-10-CM

## 2023-12-21 DIAGNOSIS — R0602 Shortness of breath: Secondary | ICD-10-CM

## 2023-12-21 DIAGNOSIS — Z7689 Persons encountering health services in other specified circumstances: Secondary | ICD-10-CM

## 2023-12-21 LAB — POCT GLYCOSYLATED HEMOGLOBIN (HGB A1C): Hemoglobin A1C: 7 % — AB (ref 4.0–5.6)

## 2023-12-21 NOTE — Progress Notes (Signed)
 [facility] [address] [place], [place] [address]  Internal MEDICINE  Office Visit Note  Patient Name: Adam Castillo  [id number]  [id number]  Date of Service: 01/08/2024   Complaints/HPI Pt is here for establishment of PCP. Chief Complaint  Patient presents with   New Patient (Initial Visit)   Diabetes   Hypertension   HPI Pt is here to establish care -usually likes to walk around the park during the week when able.  -Has neuropathy--sees Dr. Walden Guise -told he had a heart attack? Never went to cardiology. Had an exercise stress test in the last year. Taking lasix 80mg . Does get SOBOE. May need cardiology, will check echo -never been a smoker, does not drink alcohol -BP at home 130s systolic in Am typically -BG at home 135-140, occ 180.  -Lives alone, no pets -some balance problems and is doing PT now  Current Medication: Outpatient Encounter Medications as of 12/21/2023  Medication Sig   Alpha-Lipoic Acid 100 MG CAPS Take 1 capsule (100 mg total) by mouth daily.   aspirin EC 81 MG tablet Take 81 mg by mouth daily.   b complex vitamins capsule Take 1 capsule by mouth daily.   cholecalciferol (VITAMIN D3) 25 MCG (1000 UNIT) tablet Take 1,000 Units by mouth daily.   furosemide (LASIX) 80 MG tablet Take 80 mg by mouth daily.   gabapentin  (NEURONTIN ) 100 MG capsule Take 100 mg by mouth daily.   levothyroxine  (SYNTHROID ) 50 MCG tablet TAKE 1 TABLET BY MOUTH EVERY DAY   lisinopril  (ZESTRIL ) 40 MG tablet TAKE 1 TABLET BY MOUTH EVERY DAY   metFORMIN  (GLUCOPHAGE ) 500 MG tablet TAKE 1 TABLET BY MOUTH 2 TIMES DAILY WITH A MEAL.   Multiple Vitamins-Minerals (CENTRUM SILVER 50+MEN PO) Take 1 tablet by mouth daily.   potassium chloride (MICRO-K) 10 MEQ CR capsule Take 10 mEq by mouth daily.   simvastatin  (ZOCOR ) 20 MG tablet TAKE 1 TABLET BY MOUTH EVERY DAY   No facility-administered encounter medications on file as of 12/21/2023.    Surgical History: Past Surgical History:   Procedure Laterality Date   COLONOSCOPY WITH PROPOFOL      COLONOSCOPY WITH PROPOFOL  N/A 10/15/2021   Procedure: COLONOSCOPY WITH PROPOFOL ;  Surgeon: Luke Salaam, MD;  Location: [facility] ENDOSCOPY;  Service: Gastroenterology;  Laterality: N/A;   HERNIA REPAIR     INSERTION OF MESH  08/27/2021   Procedure: INSERTION OF MESH;  Surgeon: Alben Alma, MD;  Location: [facility] ORS;  Service: General;;    Medical History: Past Medical History:  Diagnosis Date   Diabetes mellitus without complication (HCC)    Hypertension    Thyroid  disease     Family History: Family History  Problem Relation Age of Onset   COPD Mother    Heart disease Father     Social History   Socioeconomic History   Marital status: Single    Spouse name: Not on file   Number of children: Not on file   Years of education: Not on file   Highest education level: Not on file  Occupational History   Not on file  Tobacco Use   Smoking status: Never   Smokeless tobacco: Never  Vaping Use   Vaping status: Never Used  Substance and Sexual Activity   Alcohol use: Never   Drug use: Never   Sexual activity: Not on file  Other Topics Concern   Not on file  Social History Narrative   Not on file   Social Drivers of Health  Financial Resource Strain: Low Risk  (10/19/2023)   Received from [facility]   Overall Financial Resource Strain (CARDIA)    Difficulty of Paying Living Expenses: Not very hard  Food Insecurity: No Food Insecurity (10/19/2023)   Received from [facility]   Hunger Vital Sign    Worried About Running Out of Food in the Last Year: Never true    Ran Out of Food in the Last Year: Never true  Transportation Needs: No Transportation Needs (10/19/2023)   Received from [facility] - Transportation    In the past 12 months, has lack of transportation kept you from medical appointments or from getting medications?: No    Lack of Transportation  (Non-Medical): No  Physical Activity: Inactive (08/11/2022)   Exercise Vital Sign    Days of Exercise per Week: 0 days    Minutes of Exercise per Session: 0 min  Stress: No Stress Concern Present (08/11/2022)   [employer] of Occupational Health - Occupational Stress Questionnaire    Feeling of Stress : Only a little  Social Connections: Socially Isolated (08/11/2022)   Social Connection and Isolation Panel [NHANES]    Frequency of Communication with Friends and Family: Twice a week    Frequency of Social Gatherings with Friends and Family: Once a week    Attends Religious Services: Never    Database administrator or Organizations: No    Attends Banker Meetings: Never    Marital Status: Divorced  Catering manager Violence: Not At Risk (08/11/2022)   Humiliation, Afraid, Rape, and Kick questionnaire    Fear of Current or Ex-Partner: No    Emotionally Abused: No    Physically Abused: No    Sexually Abused: No     Review of Systems  Constitutional:  Negative for chills, fatigue and unexpected weight change.  HENT:  Positive for postnasal drip. Negative for congestion, rhinorrhea, sneezing and sore throat.   Eyes:  Negative for redness.  Respiratory:  Positive for shortness of breath. Negative for cough and chest tightness.   Cardiovascular:  Negative for chest pain and palpitations.  Gastrointestinal:  Negative for abdominal pain, constipation, diarrhea, nausea and vomiting.  Genitourinary:  Negative for dysuria and frequency.  Musculoskeletal:  Negative for arthralgias, back pain, joint swelling and neck pain.  Skin:  Negative for rash.  Neurological: Negative.  Negative for tremors and numbness.  Hematological:  Negative for adenopathy. Does not bruise/bleed easily.  Psychiatric/Behavioral:  Negative for behavioral problems (Depression), sleep disturbance and suicidal ideas. The patient is not nervous/anxious.     Vital Signs: BP (!) 150/80 Comment: 156/79   Pulse 80   Temp 98.1 F (36.7 C)   Resp 16   Ht 5\' 9"  (1.753 m)   Wt 200 lb 6.4 oz (90.9 kg)   SpO2 97%   BMI 29.59 kg/m    Physical Exam Vitals and nursing note reviewed.  Constitutional:      General: He is not in acute distress.    Appearance: He is well-developed. He is not diaphoretic.  HENT:     Head: Normocephalic and atraumatic.  Eyes:     Pupils: Pupils are equal, round, and reactive to light.  Neck:     Thyroid : No thyromegaly.     Vascular: No JVD.     Trachea: No tracheal deviation.  Cardiovascular:     Rate and Rhythm: Normal rate and regular rhythm.     Heart  sounds: Normal heart sounds. No murmur heard.    No friction rub. No gallop.  Pulmonary:     Effort: Pulmonary effort is normal. No respiratory distress.     Breath sounds: No wheezing or rales.  Chest:     Chest wall: No tenderness.  Skin:    General: Skin is warm and dry.  Neurological:     Mental Status: He is alert and oriented to person, place, and time.  Psychiatric:        Behavior: Behavior normal.        Thought Content: Thought content normal.        Judgment: Judgment normal.       Assessment/Plan: 1. Essential hypertension (Primary) Elevated in office, but controlled at home and will monitor  2. Prediabetes - POCT HgB A1C is 7.0, continue current medications and working on diet and exercise  3. B12 deficiency - B12 and Folate Panel  4. SOBOE (shortness of breath on exertion) ON high dose lasix, will order echo. May need cardiology - ECHOCARDIOGRAM COMPLETE; Future  5. Stage 3a chronic kidney disease (HCC) Will order labs to monitor - Comprehensive metabolic panel with GFR  6. Mixed hyperlipidemia - Lipid Panel With LDL/HDL Ratio  7. Hypothyroidism due to Hashimoto's thyroiditis Will check labs - TSH + free T4  8. Other fatigue - Comprehensive metabolic panel with GFR - CBC w/Diff/Platelet - TSH + free T4 - Lipid Panel With LDL/HDL Ratio - B12 and Folate  Panel  9. Encounter to establish care with new provider Will try to obtain records, will order labs   General Counseling: fahad cisse understanding of the findings of todays visit and agrees with plan of treatment. I have discussed any further diagnostic evaluation that may be needed or ordered today. We also reviewed his medications today. he has been encouraged to call the office with any questions or concerns that should arise related to todays visit.    Counseling:    Orders Placed This Encounter  Procedures   Comprehensive metabolic panel with GFR   CBC w/Diff/Platelet   TSH + free T4   Lipid Panel With LDL/HDL Ratio   B12 and Folate Panel   POCT HgB A1C   ECHOCARDIOGRAM COMPLETE    No orders of the defined types were placed in this encounter.    This patient was seen by Taylor Favia, PA-C in collaboration with Dr. Verneta Gone as a part of collaborative care agreement.   Time spent:35 Minutes

## 2023-12-22 ENCOUNTER — Telehealth: Payer: Self-pay | Admitting: Physician Assistant

## 2023-12-22 NOTE — Telephone Encounter (Signed)
 Notified patient of echo appointment date, arrival time, location-[place]

## 2023-12-26 LAB — CBC WITH DIFFERENTIAL/PLATELET
Basophils Absolute: 0.1 10*3/uL (ref 0.0–0.2)
Basos: 1 %
EOS (ABSOLUTE): 0.4 10*3/uL (ref 0.0–0.4)
Eos: 8 %
Hematocrit: 41.8 % (ref 37.5–51.0)
Hemoglobin: 14 g/dL (ref 13.0–17.7)
Immature Grans (Abs): 0 10*3/uL (ref 0.0–0.1)
Immature Granulocytes: 0 %
Lymphocytes Absolute: 1.9 10*3/uL (ref 0.7–3.1)
Lymphs: 39 %
MCH: 30.3 pg (ref 26.6–33.0)
MCHC: 33.5 g/dL (ref 31.5–35.7)
MCV: 91 fL (ref 79–97)
Monocytes Absolute: 0.4 10*3/uL (ref 0.1–0.9)
Monocytes: 8 %
Neutrophils Absolute: 2.2 10*3/uL (ref 1.4–7.0)
Neutrophils: 44 %
Platelets: 227 10*3/uL (ref 150–450)
RBC: 4.62 x10E6/uL (ref 4.14–5.80)
RDW: 12.4 % (ref 11.6–15.4)
WBC: 4.9 10*3/uL (ref 3.4–10.8)

## 2023-12-26 LAB — COMPREHENSIVE METABOLIC PANEL WITH GFR
ALT: 14 IU/L (ref 0–44)
AST: 16 IU/L (ref 0–40)
Albumin: 4.4 g/dL (ref 3.8–4.8)
Alkaline Phosphatase: 72 IU/L (ref 44–121)
BUN/Creatinine Ratio: 27 — ABNORMAL HIGH (ref 10–24)
BUN: 54 mg/dL — ABNORMAL HIGH (ref 8–27)
Bilirubin Total: 0.5 mg/dL (ref 0.0–1.2)
CO2: 26 mmol/L (ref 20–29)
Calcium: 9.8 mg/dL (ref 8.6–10.2)
Chloride: 103 mmol/L (ref 96–106)
Creatinine, Ser: 2.01 mg/dL — ABNORMAL HIGH (ref 0.76–1.27)
Globulin, Total: 2.3 g/dL (ref 1.5–4.5)
Glucose: 128 mg/dL — ABNORMAL HIGH (ref 70–99)
Potassium: 4.6 mmol/L (ref 3.5–5.2)
Sodium: 144 mmol/L (ref 134–144)
Total Protein: 6.7 g/dL (ref 6.0–8.5)
eGFR: 34 mL/min/{1.73_m2} — ABNORMAL LOW (ref >59)

## 2023-12-26 LAB — TSH+FREE T4
Free T4: 1.38 ng/dL (ref 0.82–1.77)
TSH: 2.85 u[IU]/mL (ref 0.450–4.500)

## 2023-12-26 LAB — B12 AND FOLATE PANEL
Folate: >20 ng/mL (ref >3.0)
Vitamin B-12: 691 pg/mL (ref 232–1245)

## 2023-12-26 LAB — LIPID PANEL WITH LDL/HDL RATIO
Cholesterol, Total: 164 mg/dL (ref 100–199)
HDL: 41 mg/dL (ref >39)
LDL Chol Calc (NIH): 106 mg/dL — ABNORMAL HIGH (ref 0–99)
LDL/HDL Ratio: 2.6 ratio (ref 0.0–3.6)
Triglycerides: 89 mg/dL (ref 0–149)
VLDL Cholesterol Cal: 17 mg/dL (ref 5–40)

## 2024-01-11 ENCOUNTER — Encounter: Payer: Self-pay | Admitting: Physical Therapy

## 2024-01-11 ENCOUNTER — Ambulatory Visit: Attending: Neurology | Admitting: Physical Therapy

## 2024-01-11 DIAGNOSIS — R262 Difficulty in walking, not elsewhere classified: Secondary | ICD-10-CM | POA: Insufficient documentation

## 2024-01-11 DIAGNOSIS — R2681 Unsteadiness on feet: Secondary | ICD-10-CM | POA: Diagnosis present

## 2024-01-11 DIAGNOSIS — R269 Unspecified abnormalities of gait and mobility: Secondary | ICD-10-CM | POA: Insufficient documentation

## 2024-01-11 DIAGNOSIS — R531 Weakness: Secondary | ICD-10-CM | POA: Insufficient documentation

## 2024-01-11 NOTE — Therapy (Signed)
 OUTPATIENT PHYSICAL THERAPY NEURO EVALUATION   Patient Name: Sreekar Manner MRN: [id number] DOB:1946-06-02, 78 y.o., male Today's Date: 01/11/2024   PCP: Jacques Mattock, PA-C  REFERRING PROVIDER: Bufford Carne, MD   END OF SESSION:  PT End of Session - 01/11/24 1236     Visit Number 1    Number of Visits 24    Date for PT Re-Evaluation 04/04/24    PT Start Time 1150    PT Stop Time 1235    PT Time Calculation (min) 45 min    Equipment Utilized During Treatment Gait belt    Activity Tolerance Patient tolerated treatment well    Behavior During Therapy WFL for tasks assessed/performed             Past Medical History:  Diagnosis Date   Diabetes mellitus without complication (HCC)    Hypertension    Thyroid  disease    Past Surgical History:  Procedure Laterality Date   COLONOSCOPY WITH PROPOFOL      COLONOSCOPY WITH PROPOFOL  N/A 10/15/2021   Procedure: COLONOSCOPY WITH PROPOFOL ;  Surgeon: Luke Salaam, MD;  Location: [facility] ENDOSCOPY;  Service: Gastroenterology;  Laterality: N/A;   HERNIA REPAIR     INSERTION OF MESH  08/27/2021   Procedure: INSERTION OF MESH;  Surgeon: Alben Alma, MD;  Location: [facility] ORS;  Service: General;;   Patient Active Problem List   Diagnosis Date Noted   Hernia of scrotum 11/02/2020   Unilateral recurrent inguinal hernia without obstruction or gangrene 11/02/2020   Stage 3a chronic kidney disease (HCC) 08/14/2020   Annual physical exam 08/02/2020   Abrasion of right hand 08/02/2020   Primary osteoarthritis of right knee 06/12/2020   Hypothyroidism due to Hashimoto's thyroiditis 02/10/2020   Dyslipidemia 02/10/2020   Essential hypertension 02/07/2020    ONSET DATE: Reports steady decline over past 2 years  REFERRING DIAG:  G62.9 (ICD-10-CM) - Polyneuropathy  R26.9 (ICD-10-CM) - Gait abnormality  R26.89 (ICD-10-CM) - Balance disorder    THERAPY DIAG:  Unsteadiness on feet  Difficulty in walking  Generalized  weakness  Abnormality of gait  Rationale for Evaluation and Treatment: Rehabilitation  SUBJECTIVE:                                                                                                                                                                                             SUBJECTIVE STATEMENT: Pt reports steady decline in his balance over the past 2 years after he retired. 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 Pt denies falls, but states he "walks like a  drunk man." Pt wearing nice dressier shoes today. Pt reports increased difficulty with any household tasks that requires him to stand, due to his balance, but primarily is disappointed he can no longer dance.   Reports it feels like he is "walking on sponges" from where his feet are "numb." Pt states standing in 1 place causes his feet to be more numb.  Pt accompanied by: self  PERTINENT HISTORY: Pt reports hx of hernial operation and prior R hand/wrist fx with no residual deficits, but otherwise healthy PMH: HTN, thyroid  disease, and diabetes  PAIN:  Are you having pain? No  PRECAUTIONS: Fall  RED FLAGS: None   WEIGHT BEARING RESTRICTIONS: No  FALLS: Has patient fallen in last 6 months? No and states he "walks like a drunk" and stumbles a lot  LIVING ENVIRONMENT: Lives with: lives alone Lives in: House/apartment Stairs: Yes: External: 3 steps; none or can enter through the garage with 1 step-up to enter Has following equipment at home:  built in shower seat  PLOF: Independent with basic ADLs, Independent with homemaking with ambulation, and Independent with gait  PATIENT GOALS: prevent his balance from getting worse and ideally go back to work and return to dancing  OBJECTIVE:  Note: Objective measures were completed at Evaluation unless otherwise noted.  DIAGNOSTIC FINDINGS:  EMG Impression: Abnormal study. 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  Electronically signed by Roby Chris, MD at 07/24/2023 12:08 PM EST   COGNITION: Overall cognitive status: Within functional limits for tasks assessed   SENSATION: Light touch: Impaired bilaterally with R LE more impaired than L Proprioception: not formally assessed, but appears impaired during functional mobility  COORDINATION: WFL heel to shin  EDEMA:  None observed on eval  MUSCLE TONE: appears WNL  MUSCLE LENGTH: Not formally assessed  DTRs:  Patella: may benefit from testing Achilles: may benefit from testing  POSTURE: rounded shoulders, forward head, and posterior pelvic tilt  LOWER EXTREMITY ROM:     Active   WFL throughout B LEs Right Eval Left Eval  Hip flexion    Hip extension    Hip abduction    Hip adduction    Hip internal rotation    Hip external rotation    Knee flexion    Knee extension    Ankle dorsiflexion    Ankle plantarflexion    Ankle inversion    Ankle eversion     (Blank rows = not tested)  LOWER EXTREMITY MMT:    MMT Right Eval Left Eval  Hip flexion 5 5  Hip extension    Hip abduction    Hip adduction    Hip internal rotation    Hip external rotation    Knee flexion 5 5  Knee extension 5 5  Ankle dorsiflexion 4- 4-  Ankle plantarflexion 4- 4-  Ankle inversion    Ankle eversion    (Blank rows = not tested)  Manual Muscle Test Scale 0/5 = No muscle contraction can be seen or felt 1/5 = Contraction can be felt, but there is no motion 2-/5 = Part moves through incomplete ROM w/ gravity decreased 2/5 = Part moves through complete ROM w/ gravity decreased 2+/5 = Part moves through incomplete ROM (<50%) against gravity or through complete ROM w/ gravity 3-/5 = Part moves through incomplete ROM (>50%) against gravity 3/5 = Part moves through complete ROM against gravity 3+/5 = Part moves through complete ROM against gravity/slight  resistance 4-/5= Holds test position against slight to moderate pressure 4/5 = Part moves through complete ROM against gravity/moderate resistance 4+/5= Holds test position against moderate to strong pressure 5/5 = Part moves through complete ROM against gravity/full resistance  BED MOBILITY:  Findings: not formally assessed, but not reports of difficulty  TRANSFERS: Sit to stand: CGA  Assistive device utilized: None     Stand to sit: CGA  Assistive device utilized: None     Chair to chair: CGA  Assistive device utilized: None       RAMP:  Not tested, but would benefit from testing  CURB:  Not tested, but would benefit from testing  STAIRS: Need to assess GAIT: Findings: Gait Characteristics: excessive posterior lean throughout with his weight primarily through his heels, step through pattern, decreased step length- Right, decreased step length- Left, decreased stride length, and narrow BOS, Distance walked: ~153ft,  Assistive device utilized:None Level of assistance: CGA Comments: excessive heel strike with lateral bias and pt doesn't transfer weight through midfoot nor forefoot during stance, excessive posterior pelvic tilt throughout with compensatory excessive thoracic rounding to prevent posterior LOB  FUNCTIONAL TESTS:  6 minute walk test: need to assess 10 meter walk test: 0.753m/s Berg Balance Scale: 34/56 Functional gait assessment: need to assess  PATIENT SURVEYS:  ABC scale 56.25%  with pt reporting primarily 50% confident with majority of the items including walking around the house - pt reporting he is unsteady with all standing tasks                                                                                                                              TREATMENT DATE: 01/11/2024    10 Meter Walk Test: Patient instructed to walk 10 meters (32.8 ft) as quickly and as safely as possible at their normal speed Results: 0.798 m/s (13.36 seconds and 11.70 seconds)  with no AD and CGA for steadying   Cut off scores:   Household Ambulator  < 0.4 m/s  Limited Community Ambulator  0.4 - 0.8 m/s  [employer]  > 0.8 m/s  Increased fall risk  < 1.96m/s  Crossing a Street  >1.19m/s  MCID 0.05 m/s (small), 0.13 m/s (moderate), 0.06 m/s (significant)  (ANPTA Core Set of Outcome Measures for Adults with Neurologic Conditions, 2018)    Patient participated in [place] Balance Test and demonstrates increased fall risk as noted by score of  34/56.  (<36= high risk for falls, close to 100%; 37-45 significant >80%; 46-51 moderate >50%; 52-55 lower >25%).   [facility] PT Assessment - 01/11/24 0001       Berg Balance Test   Sit to Stand Able to stand without using hands and stabilize independently    Standing Unsupported Able to stand 2 minutes with supervision    Sitting with Back Unsupported but Feet Supported on Floor or Stool Able to sit safely and securely 2 minutes    Stand to  Sit Sits safely with minimal use of hands    Transfers Able to transfer safely, minor use of hands    Standing Unsupported with Eyes Closed Able to stand 10 seconds with supervision    Standing Unsupported with Feet Together Able to place feet together independently and stand for 1 minute with supervision    From Standing, Reach Forward with Outstretched Arm Reaches forward but needs supervision    From Standing Position, Pick up Object from Floor Able to pick up shoe, needs supervision   posteior lean bias relying on ankle strategy to maintain upright   From Standing Position, Turn to Look Behind Over each Shoulder Turn sideways only but maintains balance    Turn 360 Degrees Able to turn 360 degrees safely but slowly    Standing Unsupported, Alternately Place Feet on Step/Stool Able to complete >2 steps/needs minimal assist    Standing Unsupported, One Foot in [employer] balance while stepping or standing    Standing on One Leg Unable to try or needs assist to prevent fall     Total Score 34            Educated pt on results of assessment. Educated pt on the 3 balance systems (visual, vestibular, and somatosensory) and their contribution to a person's ability to maintain standing balance as well as how pt's sensory impairments impact his balance.  Educated pt on importance of daily visual foot assessments to ensure no injuries he is un aware of.   PATIENT EDUCATION: Education details: Therapy POC, findings on evaluation today, performance of daily foot assessments, balance systems Person educated: Patient Education method: Explanation Education comprehension: verbalized understanding and needs further education  HOME EXERCISE PROGRAM: Need to initiate in next 1-2 visits  GOALS: Goals reviewed with patient? Yes  SHORT TERM GOALS: Target date: 02/22/2024  Patient will be independent in home exercise program to improve strength/mobility and balance for better functional independence with ADLs.  Baseline: need to initiate Goal status: INITIAL   LONG TERM GOALS: Target date: 04/04/2024  Patient will increase Berg Balance score to > 51/56 to demonstrate improved balance and decreased fall risk during functional activities and ADLs.  Baseline: 34/56 Goal status: INITIAL  2.  Patient will increase Functional Gait Assessment (FGA) score to >20/30 as to reduce fall risk and improve dynamic gait safety with community ambulation.  Baseline: need to assess Goal status: INITIAL  3.  Patient will increase 10 meter walk test to >1.49m/s as to improve gait speed for better community ambulation and to reduce fall risk. Baseline: 0.756m/s without AD, CGA Goal status: INITIAL  4.  Patient will increase six minute walk test distance to >1000 for progression to community ambulator and improve gait ability  Baseline: need to assess Goal status: INITIAL  5.  Patient will increase ABC scale score >80% to demonstrate better functional mobility and better confidence with  ADLs.  Baseline: 56.25% Goal status: INITIAL   ASSESSMENT:  CLINICAL IMPRESSION: Patient is a 78 y.o. male who was seen today for physical therapy evaluation and treatment for impaired balance due to significant neuropathy. Patient demonstrates increased fall risk as noted on Berg Balance Test as well as by decreased gait speed on . When ambulating during session, patient demonstrates significant gait deviations walking primarily on his heels with excessive posterior lean associated with excessive posterior pelvic tilt and thoracic rounding. Pt requires CGA for safety when ambulating without AD, otherwise, pt has to using stepping strategies and/or reaches for stable  objects in clinic to maintain his balance. Mr. Bacheller will benefit from further skilled PT to improve these deficits in order to increase QOL, decrease fall risk, ease/safety with ADLs as well as return patient to safely participating in recreational activities.  OBJECTIVE IMPAIRMENTS: Abnormal gait, decreased balance, decreased knowledge of condition, decreased knowledge of use of DME, decreased mobility, difficulty walking, decreased strength, impaired sensation, and improper body mechanics.   ACTIVITY LIMITATIONS: carrying, lifting, bending, standing, squatting, stairs, transfers, bathing, toileting, dressing, hygiene/grooming, and locomotion level  PARTICIPATION LIMITATIONS: meal prep, cleaning, laundry, shopping, community activity, occupation, yard work, and dancing  PERSONAL FACTORS: Age, Time since onset of injury/illness/exacerbation, and 3+ comorbidities: HTN, thyroid  disease, and diabetes  are also affecting patient's functional outcome.   REHAB POTENTIAL: Good  CLINICAL DECISION MAKING: Stable/uncomplicated  EVALUATION COMPLEXITY: Moderate  PLAN:  PT FREQUENCY: 1-2x/week  PT DURATION: 12 weeks  PLANNED INTERVENTIONS: 97164- PT Re-evaluation, 97750- Physical Performance Testing, 97110-Therapeutic exercises,  97530- Therapeutic activity, [id number]- Neuromuscular re-education, 97535- Self Care, 16109- Manual therapy, [phone]- Gait training, [phone]- Canalith repositioning, [phone]- Electrical stimulation (manual), Patient/Family education, Balance training, Stair training, Joint mobilization, Vestibular training, DME instructions, Cryotherapy, Moist heat, and Biofeedback  PLAN FOR NEXT SESSION:  - FGA  - 6 min walk test  - initiate HEP  - balance interventions with narrow BOS/semi-tandem, eyes closed as appropriate, daily walking, etc. - diabetes education - follow-up on education regarding daily foot checks - stair navigation    Evren Shankland, PT, DPT, NCS, CSRS Physical Therapist - [place]  [facility]  1:52 PM 01/11/24

## 2024-01-14 ENCOUNTER — Ambulatory Visit: Attending: Neurology | Admitting: Physical Therapy

## 2024-01-14 DIAGNOSIS — R262 Difficulty in walking, not elsewhere classified: Secondary | ICD-10-CM | POA: Insufficient documentation

## 2024-01-14 DIAGNOSIS — R531 Weakness: Secondary | ICD-10-CM | POA: Insufficient documentation

## 2024-01-14 DIAGNOSIS — R269 Unspecified abnormalities of gait and mobility: Secondary | ICD-10-CM | POA: Insufficient documentation

## 2024-01-14 DIAGNOSIS — R2681 Unsteadiness on feet: Secondary | ICD-10-CM | POA: Diagnosis present

## 2024-01-14 NOTE — Therapy (Signed)
 OUTPATIENT PHYSICAL THERAPY NEURO TREATMENT   Patient Name: Adam Castillo MRN: [id number] DOB:May 23, 1946, 78 y.o., male Today's Date: 01/14/2024   PCP: Jacques Mattock, PA-C  REFERRING PROVIDER: Bufford Carne, MD   END OF SESSION:   PT End of Session - 01/14/24 1020     Visit Number 2    Number of Visits 24    Date for PT Re-Evaluation 04/04/24    PT Start Time 1023    PT Stop Time 1111    PT Time Calculation (min) 48 min    Equipment Utilized During Treatment Gait belt    Activity Tolerance Patient tolerated treatment well    Behavior During Therapy WFL for tasks assessed/performed              Past Medical History:  Diagnosis Date   Diabetes mellitus without complication (HCC)    Hypertension    Thyroid  disease    Past Surgical History:  Procedure Laterality Date   COLONOSCOPY WITH PROPOFOL      COLONOSCOPY WITH PROPOFOL  N/A 10/15/2021   Procedure: COLONOSCOPY WITH PROPOFOL ;  Surgeon: Luke Salaam, MD;  Location: [facility] ENDOSCOPY;  Service: Gastroenterology;  Laterality: N/A;   HERNIA REPAIR     INSERTION OF MESH  08/27/2021   Procedure: INSERTION OF MESH;  Surgeon: Alben Alma, MD;  Location: [facility] ORS;  Service: General;;   Patient Active Problem List   Diagnosis Date Noted   Hernia of scrotum 11/02/2020   Unilateral recurrent inguinal hernia without obstruction or gangrene 11/02/2020   Stage 3a chronic kidney disease (HCC) 08/14/2020   Annual physical exam 08/02/2020   Abrasion of right hand 08/02/2020   Primary osteoarthritis of right knee 06/12/2020   Hypothyroidism due to Hashimoto's thyroiditis 02/10/2020   Dyslipidemia 02/10/2020   Essential hypertension 02/07/2020    ONSET DATE: Reports steady decline over past 2 years  REFERRING DIAG:  G62.9 (ICD-10-CM) - Polyneuropathy  R26.9 (ICD-10-CM) - Gait abnormality  R26.89 (ICD-10-CM) - Balance disorder    THERAPY DIAG:  Difficulty in walking  Unsteadiness on feet  Generalized  weakness  Abnormality of gait  Rationale for Evaluation and Treatment: Rehabilitation  SUBJECTIVE:                                                                                                                                                                                             SUBJECTIVE STATEMENT:  Pt reports he is doing well. Plans to go mow his deceased father's yard this afternoon. Denies falls since last visit, but states he still "walks like a drunk." Denies pain, except prolonged walking causing some pain in R  LE. Reports he is planning to go dancing tonight.   Pt reports he has apt to see cardiologist on Monday, May 5th.    From Initial Eval: Pt reports steady decline in his balance over the past 2 years after he retired. 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 Pt denies falls, but states he "walks like a drunk man." Pt wearing nice dressier shoes today. Pt reports increased difficulty with any household tasks that requires him to stand, due to his balance, but primarily is disappointed he can no longer dance.   Reports it feels like he is "walking on sponges" from where his feet are "numb." Pt states standing in 1 place causes his feet to be more numb.  Pt accompanied by: self  PERTINENT HISTORY: Pt reports hx of hernial operation and prior R hand/wrist fx with no residual deficits, but otherwise healthy PMH: HTN, thyroid  disease, and diabetes  PAIN:  Are you having pain? No  PRECAUTIONS: Fall  RED FLAGS: None   WEIGHT BEARING RESTRICTIONS: No  FALLS: Has patient fallen in last 6 months? No and states he "walks like a drunk" and stumbles a lot  LIVING ENVIRONMENT: Lives with: lives alone Lives in: House/apartment Stairs: Yes: External: 3 steps; none or can enter through the garage with 1 step-up to enter Has following equipment at home:  built in shower seat  PLOF: Independent with basic ADLs,  Independent with homemaking with ambulation, and Independent with gait  PATIENT GOALS: prevent his balance from getting worse and ideally go back to work and return to dancing  OBJECTIVE:  Note: Objective measures were completed at Evaluation unless otherwise noted.  DIAGNOSTIC FINDINGS:  EMG Impression: Abnormal study. 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  Electronically signed by Roby Chris, MD at 07/24/2023 12:08 PM EST   COGNITION: Overall cognitive status: Within functional limits for tasks assessed   SENSATION: Light touch: Impaired bilaterally with R LE more impaired than L Proprioception: not formally assessed, but appears impaired during functional mobility  COORDINATION: WFL heel to shin  EDEMA:  None observed on eval  MUSCLE TONE: appears WNL  MUSCLE LENGTH: Not formally assessed  DTRs:  Patella: may benefit from testing Achilles: may benefit from testing  POSTURE: rounded shoulders, forward head, and posterior pelvic tilt  LOWER EXTREMITY ROM:     Active   WFL throughout B LEs Right Eval Left Eval  Hip flexion    Hip extension    Hip abduction    Hip adduction    Hip internal rotation    Hip external rotation    Knee flexion    Knee extension    Ankle dorsiflexion    Ankle plantarflexion    Ankle inversion    Ankle eversion     (Blank rows = not tested)  LOWER EXTREMITY MMT:    MMT Right Eval Left Eval  Hip flexion 5 5  Hip extension    Hip abduction    Hip adduction    Hip internal rotation    Hip external rotation    Knee flexion 5 5  Knee extension 5 5  Ankle dorsiflexion 4- 4-  Ankle plantarflexion 4- 4-  Ankle inversion    Ankle eversion    (Blank rows = not tested)  Manual Muscle Test Scale 0/5 = No muscle contraction can be seen or felt 1/5 = Contraction can be  felt, but there is no motion 2-/5 = Part moves through incomplete ROM w/  gravity decreased 2/5 = Part moves through complete ROM w/ gravity decreased 2+/5 = Part moves through incomplete ROM (<50%) against gravity or through complete ROM w/ gravity 3-/5 = Part moves through incomplete ROM (>50%) against gravity 3/5 = Part moves through complete ROM against gravity 3+/5 = Part moves through complete ROM against gravity/slight resistance 4-/5= Holds test position against slight to moderate pressure 4/5 = Part moves through complete ROM against gravity/moderate resistance 4+/5= Holds test position against moderate to strong pressure 5/5 = Part moves through complete ROM against gravity/full resistance  BED MOBILITY:  Findings: not formally assessed, but not reports of difficulty  TRANSFERS: Sit to stand: CGA  Assistive device utilized: None     Stand to sit: CGA  Assistive device utilized: None     Chair to chair: CGA  Assistive device utilized: None       RAMP:  Not tested, but would benefit from testing  CURB:  Not tested, but would benefit from testing  STAIRS: Need to assess GAIT: Findings: Gait Characteristics: excessive posterior lean throughout with his weight primarily through his heels, step through pattern, decreased step length- Right, decreased step length- Left, decreased stride length, and narrow BOS, Distance walked: ~133ft,  Assistive device utilized:None Level of assistance: CGA Comments: excessive heel strike with lateral bias and pt doesn't transfer weight through midfoot nor forefoot during stance, excessive posterior pelvic tilt throughout with compensatory excessive thoracic rounding to prevent posterior LOB  FUNCTIONAL TESTS:  6 minute walk test: need to assess 10 meter walk test: 0.773m/s Berg Balance Scale: 34/56 Functional gait assessment: need to assess  PATIENT SURVEYS:  ABC scale 56.25%  with pt reporting primarily 50% confident with majority of the items including walking around the house - pt reporting he is unsteady  with all standing tasks                                                                                                                              TREATMENT DATE: 01/14/2024  Pt arrives ambulating into clinic, no AD, but unsteadiness on feet with pt frequently reaching for wall support to maintain balance.  Pt reports he has started the daily foot checks and has not noticed any red areas. Will benefit from continued reinforcement of this education.  Pt reports he does check his blood sugar every morning and evening, if he doesn't forget. Educated pt on recommendation to further discuss different blood glucose monitors with his new PCP to increase pt's consistency with daily BS checks. Provided pt with hand-out on information for [facility]. Educated pt on impact of poorly managed blood sugar on his neuropathy.  6 Min Walk Test:  Instructed patient to ambulate as quickly and as safely as possible for 6 minutes using LRAD. Patient was allowed to take standing rest breaks  without stopping the test, but if the patient required a sitting rest break the clock would be stopped and the test would be over.  Results: 699 feet having to stop the test at 32min16sec due to fatigue (213 meters, Avg speed 0.80m/s) no AD, with CGA and pt frequently reaching for the wall to support his imbalance. Results indicate that the patient has reduced endurance with ambulation compared to age matched norms.  Age Matched Norms: 58-69 yo M: 26 F: 57, 19-79 yo M: 18 F: 471, 89-89 yo M: 417 F: 392 MDC: 58.21 meters (190.98 feet) or 50 meters (ANPTA Core Set of Outcome Measures for Adults with Neurologic Conditions, 2018)  HR 116bpm and SpO2 98% - immediately after , in sitting   Pt participated in Functional Gait Assessment (FGA) with score of 5/30 demonstrating high fall risk; however, this is due to pt with consistent imbalance and frequently will reach for  wall support, if available (low fall risk 25-28, medium fall risk 19-24, and high fall risk <19).   [facility] PT Assessment - 01/14/24 0001       Functional Gait  Assessment   Gait assessed  Yes    Gait Level Surface Walks 20 ft, slow speed, abnormal gait pattern, evidence for imbalance or deviates 10-15 in outside of the 12 in walkway width. Requires more than 7 sec to ambulate 20 ft.   requires >7sec and frequently will reach to touch wall for support, if available   Change in Gait Speed Makes only minor adjustments to walking speed, or accomplishes a change in speed with significant gait deviations, deviates 10-15 in outside the 12 in walkway width, or changes speed but loses balance but is able to recover and continue walking.    Gait with Horizontal Head Turns Performs head turns with moderate changes in gait velocity, slows down, deviates 10-15 in outside 12 in walkway width but recovers, can continue to walk.   less instability compared to vertical head nods   Gait with Vertical Head Turns Performs task with severe disruption of gait (eg, staggers 15 in outside 12 in walkway width, loses balance, stops, reaches for wall).    Gait and Pivot Turn Cannot turn safely, requires assistance to turn and stop.    Step Over Obstacle Cannot perform without assistance.    Gait with Narrow Base of Support Ambulates less than 4 steps heel to toe or cannot perform without assistance.    Gait with Eyes Closed Cannot walk 20 ft without assistance, severe gait deviations or imbalance, deviates greater than 15 in outside 12 in walkway width or will not attempt task.    Ambulating Backwards Cannot walk 20 ft without assistance, severe gait deviations or imbalance, deviates greater than 15 in outside 12 in walkway width or will not attempt task.    Steps Alternating feet, must use rail.    Total Score 5            Stair navigation training ascending/descending 4 steps (6" height) using B HRs with supervision -  pt self-selects reciprocal stepping pattern - continues to demo posterior lean when descending the stairs.   Performed 1 set of the below exercises and provided pt with HEP printout. Therapist instructing pt in proper form/technique for each exercise as well as education on safe set-up of exercises at home. Provided pt with red theraband to use for seated ankle strengthening - will want to advance resistance band when appropriate.   Pt unable to perform bilateral standing  heel raises even when using B UE support on balance bar; therefore, had to prescribe seated ankle strengthening exercise at home.  Patient continues to demo excessive posterior lean in standing with both feet in partial supination with primary weightbearing through his heels and lateral sides of his feet. Educated pt during static standing to work on more equal wt bearing through his feet.   Prescribed exercise targeting strengthening ankle eversion as pt constantly in supinated positioning.   PATIENT EDUCATION: Education details: Therapy POC, findings on evaluation today, performance of daily foot assessments, balance systems Person educated: Patient Education method: Explanation Education comprehension: verbalized understanding and needs further education  HOME EXERCISE PROGRAM:  Access Code: [email or web address] URL: [email or web address] Date: 01/14/2024 Prepared by: Carlen Chasten  Exercises - Wide Stance with Counter Support  - 1 x daily - 7 x weekly - 3 sets - 30 seconds hold - Sit to Stand with Counter Support  - 1 x daily - 7 x weekly - 2 sets - 10 reps - Seated Ankle Plantar Flexion with Resistance Loop  - 1 x daily - 7 x weekly - 2 sets - 10 reps - Seated Ankle Eversion with Anchored Resistance  - 1 x daily - 7 x weekly - 2 sets - 10 reps   GOALS: Goals reviewed with patient? Yes  SHORT TERM GOALS: Target date: 02/22/2024  Patient will be independent in home exercise program to improve strength/mobility  and balance for better functional independence with ADLs.  Baseline: need to initiate Goal status: INITIAL   LONG TERM GOALS: Target date: 04/04/2024  Patient will increase Berg Balance score to > 51/56 to demonstrate improved balance and decreased fall risk during functional activities and ADLs.  Baseline: 34/56 Goal status: INITIAL  2.  Patient will increase Functional Gait Assessment (FGA) score to >20/30 as to reduce fall risk and improve dynamic gait safety with community ambulation.  Baseline: 5/30 due to pt frequently reaching for wall support from being unbalanced Goal status: INITIAL  3.  Patient will increase 10 meter walk test to >1.80m/s as to improve gait speed for better community ambulation and to reduce fall risk. Baseline: 0.740m/s without AD, CGA Goal status: INITIAL  4.  Patient will increase six minute walk test distance to >1000 for progression to community ambulator and improve gait ability  Baseline:  699 feet having to stop the test at 57min16sec due to fatigue (213 meters, Avg speed 0.71m/s) no AD, with CGA and pt frequently reaching for the wall to support his imbalance Goal status: INITIAL  5.  Patient will increase ABC scale score >80% to demonstrate better functional mobility and better confidence with ADLs.  Baseline: 56.25% Goal status: INITIAL   ASSESSMENT:  CLINICAL IMPRESSION:  Patient is a 78 y.o. male who was seen today for physical therapy treatment for impaired balance due to significant neuropathy. Patient demonstrates impaired gait endurance on with pt having to stop the test at 77min16sec and only achieving 677ft. Patient also demonstrates high fall risk on FGA with score of 5/30 with pt frequently reaching for wall support or other support due to imbalance. Patient continues to demonstrate excessive posterior lean in standing and gait with primary wt bearing through his heels and lateral sides of his feet. Therapist initiated HEP focusing on  improving ankle strength and improving wt distribution through his feet in standing and during functional exercise. Mr. Goodine will benefit from further skilled PT to improve these deficits in order to increase QOL, decrease fall  risk, ease/safety with ADLs as well as return patient to safely participating in recreational activities.  OBJECTIVE IMPAIRMENTS: Abnormal gait, decreased balance, decreased knowledge of condition, decreased knowledge of use of DME, decreased mobility, difficulty walking, decreased strength, impaired sensation, and improper body mechanics.   ACTIVITY LIMITATIONS: carrying, lifting, bending, standing, squatting, stairs, transfers, bathing, toileting, dressing, hygiene/grooming, and locomotion level  PARTICIPATION LIMITATIONS: meal prep, cleaning, laundry, shopping, community activity, occupation, yard work, and dancing  PERSONAL FACTORS: Age, Time since onset of injury/illness/exacerbation, and 3+ comorbidities: HTN, thyroid  disease, and diabetes  are also affecting patient's functional outcome.   REHAB POTENTIAL: Good  CLINICAL DECISION MAKING: Stable/uncomplicated  EVALUATION COMPLEXITY: Moderate  PLAN:  PT FREQUENCY: 1-2x/week  PT DURATION: 12 weeks  PLANNED INTERVENTIONS: 97164- PT Re-evaluation, 97750- Physical Performance Testing, 97110-Therapeutic exercises, 97530- Therapeutic activity, 97112- Neuromuscular re-education, 97535- Self Care, 16109- Manual therapy, [phone]- Gait training, [phone]- Canalith repositioning, [phone]- Electrical stimulation (manual), Patient/Family education, Balance training, Stair training, Joint mobilization, Vestibular training, DME instructions, Cryotherapy, Moist heat, and Biofeedback  PLAN FOR NEXT SESSION:  - review HEP and progress when appropriate  - balance interventions with narrow BOS/semi-tandem, eyes closed as appropriate, daily walking, etc. - follow-up on diabetes education - follow-up on education regarding daily foot  checks - standing balance focusing on foot flat   Hoyle Barkdull, PT, DPT, NCS, CSRS Physical Therapist - [facility]  [facility]  11:27 AM 01/14/24

## 2024-01-18 ENCOUNTER — Ambulatory Visit
Admission: RE | Admit: 2024-01-18 | Discharge: 2024-01-18 | Disposition: A | Discharge status: Home / Self Care | Payer: Self-pay | Source: Ambulatory Visit | Attending: Physician Assistant | Admitting: Physician Assistant

## 2024-01-18 DIAGNOSIS — I358 Other nonrheumatic aortic valve disorders: Secondary | ICD-10-CM | POA: Diagnosis not present

## 2024-01-18 DIAGNOSIS — E119 Type 2 diabetes mellitus without complications: Secondary | ICD-10-CM | POA: Diagnosis not present

## 2024-01-18 DIAGNOSIS — I119 Hypertensive heart disease without heart failure: Secondary | ICD-10-CM | POA: Insufficient documentation

## 2024-01-18 DIAGNOSIS — R0602 Shortness of breath: Secondary | ICD-10-CM | POA: Insufficient documentation

## 2024-01-18 LAB — ECHOCARDIOGRAM COMPLETE
AR max vel: 2.85 cm2
AV Area VTI: 3.41 cm2
AV Area mean vel: 2.92 cm2
AV Mean grad: 3.5 mmHg
AV Peak grad: 6.6 mmHg
Ao pk vel: 1.28 m/s
Area-P 1/2: 6.07 cm2
MV VTI: 3.14 cm2
S' Lateral: 2.5 cm

## 2024-01-18 NOTE — Progress Notes (Signed)
*  PRELIMINARY RESULTS* Echocardiogram 2D Echocardiogram has been performed.  Adam Castillo 01/18/2024, 10:31 AM

## 2024-01-19 ENCOUNTER — Ambulatory Visit: Admitting: Physical Therapy

## 2024-01-19 ENCOUNTER — Telehealth: Payer: Self-pay | Admitting: Physical Therapy

## 2024-01-19 NOTE — Telephone Encounter (Signed)
 Pt contacted via telephone and author left voice mail informing of missed appointment and informed pt of future PT appointment date and time.   Carlen Chasten, PT, DPT, NCS, CSRS Physical Therapist - [place]  [facility]  3:11 PM 01/19/24

## 2024-01-21 ENCOUNTER — Ambulatory Visit: Admitting: Physician Assistant

## 2024-01-21 ENCOUNTER — Ambulatory Visit

## 2024-01-21 ENCOUNTER — Encounter: Payer: Self-pay | Admitting: Physician Assistant

## 2024-01-21 VITALS — BP 149/82 | HR 82 | Temp 98.4°F | Resp 16 | Ht 69.0 in | Wt 197.0 lb

## 2024-01-21 DIAGNOSIS — R2681 Unsteadiness on feet: Secondary | ICD-10-CM

## 2024-01-21 DIAGNOSIS — R0602 Shortness of breath: Secondary | ICD-10-CM

## 2024-01-21 DIAGNOSIS — I10 Essential (primary) hypertension: Secondary | ICD-10-CM

## 2024-01-21 DIAGNOSIS — N289 Disorder of kidney and ureter, unspecified: Secondary | ICD-10-CM | POA: Diagnosis not present

## 2024-01-21 DIAGNOSIS — E11610 Type 2 diabetes mellitus with diabetic neuropathic arthropathy: Secondary | ICD-10-CM

## 2024-01-21 DIAGNOSIS — R531 Weakness: Secondary | ICD-10-CM

## 2024-01-21 DIAGNOSIS — R262 Difficulty in walking, not elsewhere classified: Secondary | ICD-10-CM | POA: Diagnosis not present

## 2024-01-21 DIAGNOSIS — E1165 Type 2 diabetes mellitus with hyperglycemia: Secondary | ICD-10-CM

## 2024-01-21 MED ORDER — METFORMIN HCL 500 MG PO TABS: 500.0000 mg | ORAL_TABLET | Freq: Two times a day (BID) | ORAL | 3 refills | Status: AC

## 2024-01-21 NOTE — Therapy (Signed)
 OUTPATIENT PHYSICAL THERAPY NEURO TREATMENT   Patient Name: Arthell Ambush MRN: [id number] DOB:[birth year], 78 y.o., male Today's Date: 01/21/2024   PCP: Jacques Mattock, PA-C  REFERRING PROVIDER: Bufford Carne, MD   END OF SESSION:   PT End of Session - 01/21/24 1440     Visit Number 3    Number of Visits 24    Date for PT Re-Evaluation 04/04/24    PT Start Time 1441    PT Stop Time 1526    PT Time Calculation (min) 45 min    Equipment Utilized During Treatment Gait belt    Activity Tolerance Patient tolerated treatment well    Behavior During Therapy WFL for tasks assessed/performed               Past Medical History:  Diagnosis Date   Diabetes mellitus without complication (HCC)    Hypertension    Thyroid  disease    Past Surgical History:  Procedure Laterality Date   COLONOSCOPY WITH PROPOFOL      COLONOSCOPY WITH PROPOFOL  N/A 10/15/2021   Procedure: COLONOSCOPY WITH PROPOFOL ;  Surgeon: Luke Salaam, MD;  Location: [facility] ENDOSCOPY;  Service: Gastroenterology;  Laterality: N/A;   HERNIA REPAIR     INSERTION OF MESH  08/27/2021   Procedure: INSERTION OF MESH;  Surgeon: Alben Alma, MD;  Location: [facility] ORS;  Service: General;;   Patient Active Problem List   Diagnosis Date Noted   Hernia of scrotum 11/02/2020   Unilateral recurrent inguinal hernia without obstruction or gangrene 11/02/2020   Stage 3a chronic kidney disease (HCC) 08/14/2020   Annual physical exam 08/02/2020   Abrasion of right hand 08/02/2020   Primary osteoarthritis of right knee 06/12/2020   Hypothyroidism due to Hashimoto's thyroiditis 02/10/2020   Dyslipidemia 02/10/2020   Essential hypertension 02/07/2020    ONSET DATE: Reports steady decline over past 2 years  REFERRING DIAG:  G62.9 (ICD-10-CM) - Polyneuropathy  R26.9 (ICD-10-CM) - Gait abnormality  R26.89 (ICD-10-CM) - Balance disorder    THERAPY DIAG: unsteadiness Difficulty in walking  Unsteadiness on  feet  Generalized weakness  Rationale for Evaluation and Treatment: Rehabilitation  SUBJECTIVE:                                                                                                                                                                                             SUBJECTIVE STATEMENT:   Had a physician appt today, reports no aches or pains. Still having difficulty walking.    From Initial Eval: Pt reports steady decline in his balance over the past 2 years after he retired. 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 Pt denies falls, but states he "walks like a drunk man." Pt wearing nice dressier shoes today. Pt reports increased difficulty with any household tasks that requires him to stand, due to his balance, but primarily is disappointed he can no longer dance.   Reports it feels like he is "walking on sponges" from where his feet are "numb." Pt states standing in 1 place causes his feet to be more numb.  Pt accompanied by: self  PERTINENT HISTORY: Pt reports hx of hernial operation and prior R hand/wrist fx with no residual deficits, but otherwise healthy PMH: HTN, thyroid  disease, and diabetes  PAIN:  Are you having pain? No  PRECAUTIONS: Fall  RED FLAGS: None   WEIGHT BEARING RESTRICTIONS: No  FALLS: Has patient fallen in last 6 months? No and states he "walks like a drunk" and stumbles a lot  LIVING ENVIRONMENT: Lives with: lives alone Lives in: House/apartment Stairs: Yes: External: 3 steps; none or can enter through the garage with 1 step-up to enter Has following equipment at home: built in shower seat  PLOF: Independent with basic ADLs, Independent with homemaking with ambulation, and Independent with gait  PATIENT GOALS: prevent his balance from getting worse and ideally go back to work and return to dancing  OBJECTIVE:  Note: Objective measures were completed at Evaluation unless  otherwise noted.  DIAGNOSTIC FINDINGS:  EMG Impression: Abnormal study. 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  Electronically signed by Roby Chris, MD at 07/24/2023 12:08 PM EST   COGNITION: Overall cognitive status: Within functional limits for tasks assessed   SENSATION: Light touch: Impaired bilaterally with R LE more impaired than L Proprioception: not formally assessed, but appears impaired during functional mobility  COORDINATION: WFL heel to shin  EDEMA:  None observed on eval  MUSCLE TONE: appears WNL  MUSCLE LENGTH: Not formally assessed  DTRs:  Patella: may benefit from testing Achilles: may benefit from testing  POSTURE: rounded shoulders, forward head, and posterior pelvic tilt  LOWER EXTREMITY ROM:     Active   WFL throughout B LEs Right Eval Left Eval  Hip flexion    Hip extension    Hip abduction    Hip adduction    Hip internal rotation    Hip external rotation    Knee flexion    Knee extension    Ankle dorsiflexion    Ankle plantarflexion    Ankle inversion    Ankle eversion     (Blank rows = not tested)  LOWER EXTREMITY MMT:    MMT Right Eval Left Eval  Hip flexion 5 5  Hip extension    Hip abduction    Hip adduction    Hip internal rotation    Hip external rotation    Knee flexion 5 5  Knee extension 5 5  Ankle dorsiflexion 4- 4-  Ankle plantarflexion 4- 4-  Ankle inversion    Ankle eversion    (Blank rows = not tested)  Manual Muscle Test Scale 0/5 = No muscle contraction can be seen or felt 1/5 = Contraction can be felt, but there is no motion 2-/5 = Part moves through incomplete ROM w/ gravity decreased 2/5 = Part moves through complete ROM w/ gravity decreased 2+/5 = Part moves through incomplete ROM (<50%) against gravity or through complete ROM w/ gravity 3-/5 = Part moves through incomplete ROM (>50%) against  gravity 3/5 = Part  moves through complete ROM against gravity 3+/5 = Part moves through complete ROM against gravity/slight resistance 4-/5= Holds test position against slight to moderate pressure 4/5 = Part moves through complete ROM against gravity/moderate resistance 4+/5= Holds test position against moderate to strong pressure 5/5 = Part moves through complete ROM against gravity/full resistance  BED MOBILITY:  Findings: not formally assessed, but not reports of difficulty  TRANSFERS: Sit to stand: CGA  Assistive device utilized: None     Stand to sit: CGA  Assistive device utilized: None     Chair to chair: CGA  Assistive device utilized: None       RAMP:  Not tested, but would benefit from testing  CURB:  Not tested, but would benefit from testing  STAIRS: Need to assess GAIT: Findings: Gait Characteristics: excessive posterior lean throughout with his weight primarily through his heels, step through pattern, decreased step length- Right, decreased step length- Left, decreased stride length, and narrow BOS, Distance walked: ~187ft,  Assistive device utilized:None Level of assistance: CGA Comments: excessive heel strike with lateral bias and pt doesn't transfer weight through midfoot nor forefoot during stance, excessive posterior pelvic tilt throughout with compensatory excessive thoracic rounding to prevent posterior LOB  FUNCTIONAL TESTS:  6 minute walk test: need to assess 10 meter walk test: 0.780m/s Berg Balance Scale: 34/56 Functional gait assessment: need to assess  PATIENT SURVEYS:  ABC scale 56.25% with pt reporting primarily 50% confident with majority of the items including walking around the house - pt reporting he is unsteady with all standing tasks                                                                                                                              TREATMENT DATE: 01/21/2024   Neuro Re-ed: Standing with CGA next to support surface:  Airex pad: static  stand 30 seconds x 2 trials, noticeable trembling of ankles/LE's with fatigue and challenge to maintain stability Airex pad: horizontal head turns  scanning room 10x ; cueing for arc of motion  Airex pad: vertical head turns 30 seconds, cueing for arc of motion, noticeable sway with upward gaze increasing demand on ankle righting reaction musculature Airex pad: one foot on 6" step one foot on airex pad, hold position for 30 seconds, switch legs, 2x each LE;  Airex balance beam: -lateral step 6x length -tandem walk 6x length of // bars with UE support -PVC pipe chest press 10x; overhead press 10x  TherAct:  Sit to stand 10x   Anterior/posterior weight shift on tilt board  ambulate in hallway: -horizontal head turns with cues for reading alphabet from cards 86 ftx 2 sets -horizontal head turns with cues for reading number and symbols from cards 86 ft x 2 sets  -"red light/green light" for sudden initiation/termination of ambulation with close CGA for carryover to natural environment 2x 86 ft    Speed ladder; -one foot per  square 8x length ; occasional LOB -lateral step two feet per square 8x length  Seated: RTB 4 way ankle 10x each direction RTB march with band across forefoot keeping feet shoulder width apart 10x Adduction squeeze with LAQ 15x  PATIENT EDUCATION: Education details: Therapy POC, findings on evaluation today, performance of daily foot assessments, balance systems Person educated: Patient Education method: Explanation Education comprehension: verbalized understanding and needs further education  HOME EXERCISE PROGRAM:  Access Code: [email or web address] URL: [email or web address] Date: 01/14/2024 Prepared by: Carlen Chasten  Exercises - Wide Stance with Counter Support  - 1 x daily - 7 x weekly - 3 sets - 30 seconds hold - Sit to Stand with Counter Support  - 1 x daily - 7 x weekly - 2 sets - 10 reps - Seated Ankle Plantar Flexion with Resistance Loop  - 1 x  daily - 7 x weekly - 2 sets - 10 reps - Seated Ankle Eversion with Anchored Resistance  - 1 x daily - 7 x weekly - 2 sets - 10 reps   GOALS: Goals reviewed with patient? Yes  SHORT TERM GOALS: Target date: 02/22/2024  Patient will be independent in home exercise program to improve strength/mobility and balance for better functional independence with ADLs.  Baseline: need to initiate Goal status: INITIAL   LONG TERM GOALS: Target date: 04/04/2024  Patient will increase Berg Balance score to > 51/56 to demonstrate improved balance and decreased fall risk during functional activities and ADLs.  Baseline: 34/56 Goal status: INITIAL  2.  Patient will increase Functional Gait Assessment (FGA) score to >20/30 as to reduce fall risk and improve dynamic gait safety with community ambulation.  Baseline: 5/30 due to pt frequently reaching for wall support from being unbalanced Goal status: INITIAL  3.  Patient will increase 10 meter walk test to >1.40m/s as to improve gait speed for better community ambulation and to reduce fall risk. Baseline: 0.733m/s without AD, CGA Goal status: INITIAL  4.  Patient will increase six minute walk test distance to >1000 for progression to community ambulator and improve gait ability  Baseline:  699 feet having to stop the test at 15min16sec due to fatigue (213 meters, Avg speed 0.21m/s) no AD, with CGA and pt frequently reaching for the wall to support his imbalance Goal status: INITIAL  5.  Patient will increase ABC scale score >80% to demonstrate better functional mobility and better confidence with ADLs.  Baseline: 56.25% Goal status: INITIAL   ASSESSMENT:  CLINICAL IMPRESSION:  . Patient has limited weight shift through anterior aspect of foot resulting in posterior LOB. Limited spatial awareness present throughout session for LE placement. Mr. Konow will benefit from further skilled PT to improve these deficits in order to increase QOL, decrease fall  risk, ease/safety with ADLs as well as return patient to safely participating in recreational activities.  OBJECTIVE IMPAIRMENTS: Abnormal gait, decreased balance, decreased knowledge of condition, decreased knowledge of use of DME, decreased mobility, difficulty walking, decreased strength, impaired sensation, and improper body mechanics.   ACTIVITY LIMITATIONS: carrying, lifting, bending, standing, squatting, stairs, transfers, bathing, toileting, dressing, hygiene/grooming, and locomotion level  PARTICIPATION LIMITATIONS: meal prep, cleaning, laundry, shopping, community activity, occupation, yard work, and dancing  PERSONAL FACTORS: Age, Time since onset of injury/illness/exacerbation, and 3+ comorbidities: HTN, thyroid  disease, and diabetes are also affecting patient's functional outcome.   REHAB POTENTIAL: Good  CLINICAL DECISION MAKING: Stable/uncomplicated  EVALUATION COMPLEXITY: Moderate  PLAN:  PT FREQUENCY: 1-2x/week  PT DURATION: 12 weeks  PLANNED INTERVENTIONS: 97164- PT Re-evaluation, 97750- Physical Performance Testing, 97110-Therapeutic exercises, 97530- Therapeutic activity, 97112- Neuromuscular re-education, [phone]- Self Care, 60454- Manual therapy, [phone]- Gait training, [phone]- Canalith repositioning, [phone]- Electrical stimulation (manual), Patient/Family education, Balance training, Stair training, Joint mobilization, Vestibular training, DME instructions, Cryotherapy, Moist heat, and Biofeedback  PLAN FOR NEXT SESSION:  - review HEP and progress when appropriate  - balance interventions with narrow BOS/semi-tandem, eyes closed as appropriate, daily walking, etc. - follow-up on diabetes education - follow-up on education regarding daily foot checks - standing balance focusing on foot flat    Aycen Porreca  Brain Cahill, PT, DPT Physical Therapist - [facility]  Outpatient Physical Therapy- Main Campus [phone]    3:27 PM 01/21/24

## 2024-01-21 NOTE — Progress Notes (Signed)
 [facility] [address] [place], [place] [address]  Internal MEDICINE  Office Visit Note  Patient Name: Adam Castillo  [id number]  [id number]  Date of Service: 02/10/2024  Chief Complaint  Patient presents with   Follow-up   Hypertension   Diabetes   Quality Metric Gaps    AWV    HPI Pt is here for routine follow up -Echo reviewed: Ef normal at 60-65%, Grade I diastolic dysfunction. Trivial MR. Aortic sclerosis without stenosis. Mild LVH. -BP at home 135 systolic, will keep log to bring in next visit -Sugars around 135 this morning -Still gets SOBOE, present for years. Will check PFTs as he reports a prior imaging study suggested possible COPD. May still need cardiology in future -Doing PT now for balance -Labs reviewed: Renal function very reduced--will cut back lasix to 40mg --no swelling in ankles and continue to monitor, LDL a little elevated  Current Medication: Outpatient Encounter Medications as of 01/21/2024  Medication Sig   Alpha-Lipoic Acid 100 MG CAPS Take 1 capsule (100 mg total) by mouth daily.   aspirin EC 81 MG tablet Take 81 mg by mouth daily.   b complex vitamins capsule Take 1 capsule by mouth daily.   cholecalciferol (VITAMIN D3) 25 MCG (1000 UNIT) tablet Take 1,000 Units by mouth daily.   furosemide (LASIX) 80 MG tablet Take 80 mg by mouth daily.   gabapentin  (NEURONTIN ) 100 MG capsule Take 100 mg by mouth daily.   levothyroxine  (SYNTHROID ) 50 MCG tablet TAKE 1 TABLET BY MOUTH EVERY DAY   lisinopril  (ZESTRIL ) 40 MG tablet TAKE 1 TABLET BY MOUTH EVERY DAY   Multiple Vitamins-Minerals (CENTRUM SILVER 50+MEN PO) Take 1 tablet by mouth daily.   potassium chloride (MICRO-K) 10 MEQ CR capsule Take 10 mEq by mouth daily.   simvastatin  (ZOCOR ) 20 MG tablet TAKE 1 TABLET BY MOUTH EVERY DAY   [DISCONTINUED] metFORMIN  (GLUCOPHAGE ) 500 MG tablet TAKE 1 TABLET BY MOUTH 2 TIMES DAILY WITH A MEAL.   metFORMIN  (GLUCOPHAGE ) 500 MG tablet Take 1 tablet (500 mg  total) by mouth 2 (two) times daily with a meal.   No facility-administered encounter medications on file as of 01/21/2024.    Surgical History: Past Surgical History:  Procedure Laterality Date   COLONOSCOPY WITH PROPOFOL      COLONOSCOPY WITH PROPOFOL  N/A 10/15/2021   Procedure: COLONOSCOPY WITH PROPOFOL ;  Surgeon: Luke Salaam, MD;  Location: [facility] ENDOSCOPY;  Service: Gastroenterology;  Laterality: N/A;   HERNIA REPAIR     INSERTION OF MESH  08/27/2021   Procedure: INSERTION OF MESH;  Surgeon: Alben Alma, MD;  Location: [facility] ORS;  Service: General;;    Medical History: Past Medical History:  Diagnosis Date   Diabetes mellitus without complication (HCC)    Hypertension    Thyroid  disease     Family History: Family History  Problem Relation Age of Onset   COPD Mother    Heart disease Father     Social History   Socioeconomic History   Marital status: Single    Spouse name: Not on file   Number of children: Not on file   Years of education: Not on file   Highest education level: Not on file  Occupational History   Not on file  Tobacco Use   Smoking status: Never   Smokeless tobacco: Never  Vaping Use   Vaping status: Never Used  Substance and Sexual Activity   Alcohol use: Never   Drug use: Never   Sexual activity:  Not on file  Other Topics Concern   Not on file  Social History Narrative   Not on file   Social Drivers of Health   Financial Resource Strain: Low Risk  (10/19/2023)   Received from [facility]   Overall Financial Resource Strain (CARDIA)    Difficulty of Paying Living Expenses: Not very hard  Food Insecurity: No Food Insecurity (10/19/2023)   Received from [facility]   Hunger Vital Sign    Worried About Running Out of Food in the Last Year: Never true    Ran Out of Food in the Last Year: Never true  Transportation Needs: No Transportation Needs (10/19/2023)   Received from [facility] - Transportation    In the past 12 months, has lack of transportation kept you from medical appointments or from getting medications?: No    Lack of Transportation (Non-Medical): No  Physical Activity: Inactive (08/11/2022)   Exercise Vital Sign    Days of Exercise per Week: 0 days    Minutes of Exercise per Session: 0 min  Stress: No Stress Concern Present (08/11/2022)   [employer] of Occupational Health - Occupational Stress Questionnaire    Feeling of Stress : Only a little  Social Connections: Socially Isolated (08/11/2022)   Social Connection and Isolation Panel [NHANES]    Frequency of Communication with Friends and Family: Twice a week    Frequency of Social Gatherings with Friends and Family: Once a week    Attends Religious Services: Never    Database administrator or Organizations: No    Attends Banker Meetings: Never    Marital Status: Divorced  Catering manager Violence: Not At Risk (08/11/2022)   Humiliation, Afraid, Rape, and Kick questionnaire    Fear of Current or Ex-Partner: No    Emotionally Abused: No    Physically Abused: No    Sexually Abused: No      Review of Systems  Constitutional:  Negative for chills, fatigue and unexpected weight change.  HENT:  Positive for postnasal drip. Negative for congestion, rhinorrhea, sneezing and sore throat.   Eyes:  Negative for redness.  Respiratory:  Positive for shortness of breath. Negative for cough and chest tightness.   Cardiovascular:  Negative for chest pain and palpitations.  Gastrointestinal:  Negative for abdominal pain, constipation, diarrhea, nausea and vomiting.  Genitourinary:  Negative for dysuria and frequency.  Musculoskeletal:  Negative for arthralgias, back pain, joint swelling and neck pain.  Skin:  Negative for rash.  Neurological: Negative.  Negative for tremors and numbness.  Hematological:  Negative for adenopathy. Does not bruise/bleed easily.   Psychiatric/Behavioral:  Negative for behavioral problems (Depression), sleep disturbance and suicidal ideas. The patient is not nervous/anxious.     Vital Signs: BP (!) 149/82   Pulse 82   Temp 98.4 F (36.9 C)   Resp 16   Ht 5\' 9"  (1.753 m)   Wt 197 lb (89.4 kg)   SpO2 96%   BMI 29.09 kg/m    Physical Exam Vitals and nursing note reviewed.  Constitutional:      General: He is not in acute distress.    Appearance: He is well-developed. He is not diaphoretic.  HENT:     Head: Normocephalic and atraumatic.  Eyes:     Pupils: Pupils are equal, round, and reactive to light.  Neck:     Thyroid : No thyromegaly.     Vascular: No JVD.  Trachea: No tracheal deviation.  Cardiovascular:     Rate and Rhythm: Normal rate and regular rhythm.     Heart sounds: Normal heart sounds. No murmur heard.    No friction rub. No gallop.  Pulmonary:     Effort: Pulmonary effort is normal. No respiratory distress.     Breath sounds: No wheezing or rales.  Chest:     Chest wall: No tenderness.  Musculoskeletal:     Right lower leg: No edema.     Left lower leg: No edema.  Skin:    General: Skin is warm and dry.  Neurological:     Mental Status: He is alert and oriented to person, place, and time.  Psychiatric:        Behavior: Behavior normal.        Thought Content: Thought content normal.        Judgment: Judgment normal.        Assessment/Plan: 1. Essential hypertension Controlled at home, will continue to monitor as lasix reduced. May need additional medication added in future  2. Type 2 diabetes mellitus with hyperglycemia, without long-term current use of insulin (HCC) (Primary) May continue current medication and monitoring - Urine Microalbumin w/creat. ratio - metFORMIN  (GLUCOPHAGE ) 500 MG tablet; Take 1 tablet (500 mg total) by mouth 2 (two) times daily with a meal.  Dispense: 180 tablet; Refill: 3  3. Abnormal renal function Will recheck labs with reduction in  lasix to 40mg . May need further evaluation with nephrology if not improving - Comprehensive metabolic panel with GFR  4. SOBOE (shortness of breath on exertion) Echo reviewed, will check PFT due to possible imaging showing COPD. May still need cardiology referral in future - Pulmonary Function Test; Future   General Counseling: stran raper understanding of the findings of todays visit and agrees with plan of treatment. I have discussed any further diagnostic evaluation that may be needed or ordered today. We also reviewed his medications today. he has been encouraged to call the office with any questions or concerns that should arise related to todays visit.    Orders Placed This Encounter  Procedures   Urine Microalbumin w/creat. ratio   Comprehensive metabolic panel with GFR   Pulmonary Function Test    Meds ordered this encounter  Medications   metFORMIN  (GLUCOPHAGE ) 500 MG tablet    Sig: Take 1 tablet (500 mg total) by mouth 2 (two) times daily with a meal.    Dispense:  180 tablet    Refill:  3    This patient was seen by Taylor Favia, PA-C in collaboration with Dr. Verneta Gone as a part of collaborative care agreement.   Total time spent:30 Minutes Time spent includes review of chart, medications, test results, and follow up plan with the patient.      Dr Fozia M Khan Internal medicine

## 2024-01-22 LAB — MICROALBUMIN / CREATININE URINE RATIO
Creatinine, Urine: 27.8 mg/dL
Microalb/Creat Ratio: 12 mg/g{creat} (ref 0–29)
Microalbumin, Urine: 3.4 ug/mL

## 2024-01-25 ENCOUNTER — Ambulatory Visit: Admitting: Physical Therapy

## 2024-01-25 DIAGNOSIS — R269 Unspecified abnormalities of gait and mobility: Secondary | ICD-10-CM

## 2024-01-25 DIAGNOSIS — R262 Difficulty in walking, not elsewhere classified: Secondary | ICD-10-CM | POA: Diagnosis not present

## 2024-01-25 DIAGNOSIS — R531 Weakness: Secondary | ICD-10-CM

## 2024-01-25 DIAGNOSIS — R2681 Unsteadiness on feet: Secondary | ICD-10-CM

## 2024-01-25 NOTE — Therapy (Signed)
 OUTPATIENT PHYSICAL THERAPY NEURO TREATMENT   Patient Name: Adam Castillo MRN: [id number] DOB:[birth year], 78 y.o., male Today's Date: 01/25/2024   PCP: Jacques Mattock, PA-C  REFERRING PROVIDER: Bufford Carne, MD   END OF SESSION:   PT End of Session - 01/25/24 1533     Visit Number 4    Number of Visits 24    Date for PT Re-Evaluation 04/04/24    PT Start Time 1533    PT Stop Time 1615    PT Time Calculation (min) 42 min    Equipment Utilized During Treatment Gait belt    Activity Tolerance Patient tolerated treatment well    Behavior During Therapy WFL for tasks assessed/performed                Past Medical History:  Diagnosis Date   Diabetes mellitus without complication (HCC)    Hypertension    Thyroid  disease    Past Surgical History:  Procedure Laterality Date   COLONOSCOPY WITH PROPOFOL      COLONOSCOPY WITH PROPOFOL  N/A 10/15/2021   Procedure: COLONOSCOPY WITH PROPOFOL ;  Surgeon: Luke Salaam, MD;  Location: [facility] END[facility]COPY;  Service: Gastroenterology;  Laterality: N/A;   HERNIA REPAIR     INSERTION OF MESH  08/27/2021   Procedure: INSERTION OF MESH;  Surgeon: Alben Alma, MD;  Location: [facility] ORS;  Service: General;;   Patient Active Problem List   Diagnosis Date Noted   Hernia of scrotum 11/02/2020   Unilateral recurrent inguinal hernia without obstruction or gangrene 11/02/2020   Stage 3a chronic kidney disease (HCC) 08/14/2020   Annual physical exam 08/02/2020   Abrasion of right hand 08/02/2020   Primary osteoarthritis of right knee 06/12/2020   Hypothyroidism due to Hashimoto's thyroiditis 02/10/2020   Dyslipidemia 02/10/2020   Essential hypertension 02/07/2020    ONSET DATE: Reports steady decline over past 2 years  REFERRING DIAG:  G62.9 (ICD-10-CM) - Polyneuropathy  R26.9 (ICD-10-CM) - Gait abnormality  R26.89 (ICD-10-CM) - Balance disorder    THERAPY DIAG:  Difficulty in walking  Unsteadiness on feet  Generalized  weakness  Abnormality of gait  Rationale for Evaluation and Treatment: Rehabilitation  SUBJECTIVE:                                                                                                                                                                                             SUBJECTIVE STATEMENT:  Pt states he has been cleaning around the house today. Pt reports he enjoyed dancing Friday night. Denies falls since last session, but states "I just walk crooked."  Pt reports he has been doing his HEP.  From Initial Eval: Pt reports steady decline in his balance over the past 2 years after he retired. 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 Pt denies falls, but states he "walks like a drunk man." Pt wearing nice dressier shoes today. Pt reports increased difficulty with any household tasks that requires him to stand, due to his balance, but primarily is disappointed he can no longer dance.   Reports it feels like he is "walking on sponges" from where his feet are "numb." Pt states standing in 1 place causes his feet to be more numb.  Pt accompanied by: self  PERTINENT HISTORY: Pt reports hx of hernial operation and prior R hand/wrist fx with no residual deficits, but otherwise healthy PMH: HTN, thyroid  disease, and diabetes  PAIN:  Are you having pain? No  PRECAUTIONS: Fall  RED FLAGS: None   WEIGHT BEARING RESTRICTIONS: No  FALLS: Has patient fallen in last 6 months? No and states he "walks like a drunk" and stumbles a lot  LIVING ENVIRONMENT: Lives with: lives alone Lives in: House/apartment Stairs: Yes: External: 3 steps; none or can enter through the garage with 1 step-up to enter Has following equipment at home: built in shower seat  PLOF: Independent with basic ADLs, Independent with homemaking with ambulation, and Independent with gait  PATIENT GOALS: prevent his balance from getting worse and ideally  go back to work and return to dancing  OBJECTIVE:  Note: Objective measures were completed at Evaluation unless otherwise noted.  DIAGNOSTIC FINDINGS:  EMG Impression: Abnormal study. 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  Electronically signed by Roby Chris, MD at 07/24/2023 12:08 PM EST   COGNITION: Overall cognitive status: Within functional limits for tasks assessed   SENSATION: Light touch: Impaired bilaterally with R LE more impaired than L Proprioception: not formally assessed, but appears impaired during functional mobility  COORDINATION: WFL heel to shin  EDEMA:  None observed on eval  MUSCLE TONE: appears WNL  MUSCLE LENGTH: Not formally assessed  DTRs:  Patella: may benefit from testing Achilles: may benefit from testing  POSTURE: rounded shoulders, forward head, and posterior pelvic tilt  LOWER EXTREMITY ROM:     Active   WFL throughout B LEs Right Eval Left Eval  Hip flexion    Hip extension    Hip abduction    Hip adduction    Hip internal rotation    Hip external rotation    Knee flexion    Knee extension    Ankle dorsiflexion    Ankle plantarflexion    Ankle inversion    Ankle eversion     (Blank rows = not tested)  LOWER EXTREMITY MMT:    MMT Right Eval Left Eval  Hip flexion 5 5  Hip extension    Hip abduction    Hip adduction    Hip internal rotation    Hip external rotation    Knee flexion 5 5  Knee extension 5 5  Ankle dorsiflexion 4- 4-  Ankle plantarflexion 4- 4-  Ankle inversion    Ankle eversion    (Blank rows = not tested)  Manual Muscle Test Scale 0/5 = No muscle contraction can be seen or felt 1/5 = Contraction can be felt, but there is no motion 2-/5 = Part moves through incomplete ROM w/ gravity decreased 2/5 = Part moves through complete ROM w/ gravity decreased 2+/5 =  Part moves through incomplete ROM (<50%) against gravity  or through complete ROM w/ gravity 3-/5 = Part moves through incomplete ROM (>50%) against gravity 3/5 = Part moves through complete ROM against gravity 3+/5 = Part moves through complete ROM against gravity/slight resistance 4-/5= Holds test position against slight to moderate pressure 4/5 = Part moves through complete ROM against gravity/moderate resistance 4+/5= Holds test position against moderate to strong pressure 5/5 = Part moves through complete ROM against gravity/full resistance  BED MOBILITY:  Findings: not formally assessed, but not reports of difficulty  TRANSFERS: Sit to stand: CGA  Assistive device utilized: None     Stand to sit: CGA  Assistive device utilized: None     Chair to chair: CGA  Assistive device utilized: None       RAMP:  Not tested, but would benefit from testing  CURB:  Not tested, but would benefit from testing  STAIRS: Need to assess GAIT: Findings: Gait Characteristics: excessive posterior lean throughout with his weight primarily through his heels, step through pattern, decreased step length- Right, decreased step length- Left, decreased stride length, and narrow BOS, Distance walked: ~182ft,  Assistive device utilized:None Level of assistance: CGA Comments: excessive heel strike with lateral bias and pt doesn't transfer weight through midfoot nor forefoot during stance, excessive posterior pelvic tilt throughout with compensatory excessive thoracic rounding to prevent posterior LOB  FUNCTIONAL TESTS:  6 minute walk test: need to assess 10 meter walk test: 0.712m/s Berg Balance Scale: 34/56 Functional gait assessment: need to assess  PATIENT SURVEYS:  ABC scale 56.25% with pt reporting primarily 50% confident with majority of the items including walking around the house - pt reporting he is unsteady with all standing tasks                                                                                                                               TREATMENT DATE: 01/25/2024   Standing balance interventions at balance bar for safety, but no UE support, including:  No shoes (socks only) static standing on firm ground with focus on improved midline orientation (not leaning posteriorly) x30 sec Improved midline posture compared to last time seen by this therapist 1/2 tandem stance on firm ground x30sec each  Increased difficulty with R LE forward as pt has R posterior lean  CGA for safety and cuing for improved midline orientation Regular stance on airex pad x30 sec Cuing for increased anterior lean with focus on WBing through toes Regular stance on airex pad  10x horizontal head turns 10x vertical head turns - this was more challenging with pt having frequent anterior/posterior LOB compared to horizontal Returned to static standing on firm ground with pt having increased posterior lean into heels after standing on airex, may be fatigue related? Or may be due to pt having to compensate with posterior lean on airex pad to maintain balance, inappropriately reinforcing this balance technique?  B LE ankle strengthening  for improved balance including:  Seated ankle PF against RTB resistance x15 reps increased to GTB resistance x15 reps Continues to have supination bias during PF with therapist providing cuing and external target to improve Seated ankle eversion against RTB resistance 2x 15 reps each Cuing to avoid compensation with movement at knee  Gait training 5 min on treadmill at 0.8 - 0.52mph from 0 % to 5% of incline with focus on cuing for pt to roll off his toes during terminal stance phase to decrease tendency to walk on heels - reached 0.14mi  HR 104bpm and SpO2 98% afterwards   Gait training additional ~115ft overground focused on carrying over improved gait mechanics from treadmill - continues to have excessive bilateral R>L inversion rolling bias, but improved weight distribution into toes during terminal stance  phase. Improved balance throughout gait as well with decreased posterior lean     PATIENT EDUCATION: Education details: Therapy POC, findings on evaluation today, performance of daily foot assessments, balance systems Person educated: Patient Education method: Explanation Education comprehension: verbalized understanding and needs further education  HOME EXERCISE PROGRAM:  Access Code: [email or web address] URL: [email or web address] Date: 01/14/2024 Prepared by: Carlen Chasten  Exercises - Wide Stance with Counter Support  - 1 x daily - 7 x weekly - 3 sets - 30 seconds hold - Sit to Stand with Counter Support  - 1 x daily - 7 x weekly - 2 sets - 10 reps - Seated Ankle Plantar Flexion with Resistance Loop  - 1 x daily - 7 x weekly - 2 sets - 10 reps - Seated Ankle Eversion with Anchored Resistance  - 1 x daily - 7 x weekly - 2 sets - 10 reps   GOALS: Goals reviewed with patient? Yes  SHORT TERM GOALS: Target date: 02/22/2024  Patient will be independent in home exercise program to improve strength/mobility and balance for better functional independence with ADLs.  Baseline: need to initiate Goal status: INITIAL   LONG TERM GOALS: Target date: 04/04/2024  Patient will increase Berg Balance score to > 51/56 to demonstrate improved balance and decreased fall risk during functional activities and ADLs.  Baseline: 34/56 Goal status: INITIAL  2.  Patient will increase Functional Gait Assessment (FGA) score to >20/30 as to reduce fall risk and improve dynamic gait safety with community ambulation.  Baseline: 5/30 due to pt frequently reaching for wall support from being unbalanced Goal status: INITIAL  3.  Patient will increase 10 meter walk test to >1.68m/s as to improve gait speed for better community ambulation and to reduce fall risk. Baseline: 0.765m/s without AD, CGA Goal status: INITIAL  4.  Patient will increase six minute walk test distance to >1000 for progression to  community ambulator and improve gait ability  Baseline:  699 feet having to stop the test at 33min16sec due to fatigue (213 meters, Avg speed 0.12m/s) no AD, with CGA and pt frequently reaching for the wall to support his imbalance Goal status: INITIAL  5.  Patient will increase ABC scale score >80% to demonstrate better functional mobility and better confidence with ADLs.  Baseline: 56.25% Goal status: INITIAL   ASSESSMENT:  CLINICAL IMPRESSION:  Patient is a 78 y.o. male who was seen today for physical therapy treatment for impaired balance due to significant neuropathy. Patient continues to have limited weight shift through anterior aspect of foot resulting in posterior LOB, but this is improving. Also, due to neuropathy, pt has limited proprioceptive awareness of where his weight is distributed in  his feet throughout session; however, this is improving as noted during static standing balance today. Pt tolerated more targeted gait training on treadmill with focus on rolling through his toes during terminal stance/pre-swing with good carryover into overground gait. Mr. Sieminski will benefit from further skilled PT to improve these deficits in order to increase QOL, decrease fall risk, ease/safety with ADLs as well as return patient to safely participating in recreational activities.  OBJECTIVE IMPAIRMENTS: Abnormal gait, decreased balance, decreased knowledge of condition, decreased knowledge of use of DME, decreased mobility, difficulty walking, decreased strength, impaired sensation, and improper body mechanics.   ACTIVITY LIMITATIONS: carrying, lifting, bending, standing, squatting, stairs, transfers, bathing, toileting, dressing, hygiene/grooming, and locomotion level  PARTICIPATION LIMITATIONS: meal prep, cleaning, laundry, shopping, community activity, occupation, yard work, and dancing  PERSONAL FACTORS: Age, Time since onset of injury/illness/exacerbation, and 3+ comorbidities: HTN,  thyroid  disease, and diabetes are also affecting patient's functional outcome.   REHAB POTENTIAL: Good  CLINICAL DECISION MAKING: Stable/uncomplicated  EVALUATION COMPLEXITY: Moderate  PLAN:  PT FREQUENCY: 1-2x/week  PT DURATION: 12 weeks  PLANNED INTERVENTIONS: 97164- PT Re-evaluation, 97750- Physical Performance Testing, 97110-Therapeutic exercises, 97530- Therapeutic activity, 97112- Neuromuscular re-education, 97535- Self Care, 53664- Manual therapy, [phone]- Gait training, [phone]- Canalith repositioning, [phone]- Electrical stimulation (manual), Patient/Family education, Balance training, Stair training, Joint mobilization, Vestibular training, DME instructions, Cryotherapy, Moist heat, and Biofeedback  PLAN FOR NEXT SESSION:  - review HEP and progress in next few sessions  - balance interventions with narrow BOS/semi-tandem, eyes closed as appropriate, daily walking, etc. - follow-up on diabetes education - follow-up on education regarding daily foot checks - standing balance focusing on foot flat as pt tends to have posterior lean/wt shift into heels - gait training on treadmill with improved foot rocker, rolling through toes during terminal stance/pre-swing    Cianni Manny, PT, DPT, NCS, CSRS Physical Therapist - [facility]  [facility]  4:28 PM 01/25/24

## 2024-01-27 ENCOUNTER — Ambulatory Visit: Admitting: Internal Medicine

## 2024-01-27 DIAGNOSIS — R0602 Shortness of breath: Secondary | ICD-10-CM

## 2024-01-27 LAB — COMPREHENSIVE METABOLIC PANEL WITH GFR
ALT: 15 IU/L (ref 0–44)
AST: 14 IU/L (ref 0–40)
Albumin: 4.1 g/dL (ref 3.8–4.8)
Alkaline Phosphatase: 74 IU/L (ref 44–121)
BUN/Creatinine Ratio: 21 (ref 10–24)
BUN: 36 mg/dL — ABNORMAL HIGH (ref 8–27)
Bilirubin Total: 0.5 mg/dL (ref 0.0–1.2)
CO2: 22 mmol/L (ref 20–29)
Calcium: 9.6 mg/dL (ref 8.6–10.2)
Chloride: 104 mmol/L (ref 96–106)
Creatinine, Ser: 1.7 mg/dL — ABNORMAL HIGH (ref 0.76–1.27)
Globulin, Total: 2.2 g/dL (ref 1.5–4.5)
Glucose: 223 mg/dL — ABNORMAL HIGH (ref 70–99)
Potassium: 4.6 mmol/L (ref 3.5–5.2)
Sodium: 142 mmol/L (ref 134–144)
Total Protein: 6.3 g/dL (ref 6.0–8.5)
eGFR: 41 mL/min/{1.73_m2} — ABNORMAL LOW (ref >59)

## 2024-01-28 ENCOUNTER — Ambulatory Visit: Admitting: Physical Therapy

## 2024-01-28 DIAGNOSIS — R269 Unspecified abnormalities of gait and mobility: Secondary | ICD-10-CM

## 2024-01-28 DIAGNOSIS — R2681 Unsteadiness on feet: Secondary | ICD-10-CM

## 2024-01-28 DIAGNOSIS — R262 Difficulty in walking, not elsewhere classified: Secondary | ICD-10-CM

## 2024-01-28 DIAGNOSIS — R531 Weakness: Secondary | ICD-10-CM

## 2024-01-28 NOTE — Therapy (Signed)
 OUTPATIENT PHYSICAL THERAPY NEURO TREATMENT   Patient Name: Adam Castillo MRN: [id number] DOB:November 27, 1945, 78 y.o., male Today's Date: 01/28/2024   PCP: Jacques Mattock, PA-C  REFERRING PROVIDER: Bufford Carne, MD   END OF SESSION:   PT End of Session - 01/28/24 1420     Visit Number 5    Number of Visits 24    Date for PT Re-Evaluation 04/04/24    PT Start Time 1445    PT Stop Time 1525    PT Time Calculation (min) 40 min    Equipment Utilized During Treatment Gait belt    Activity Tolerance Patient tolerated treatment well    Behavior During Therapy WFL for tasks assessed/performed                 Past Medical History:  Diagnosis Date   Diabetes mellitus without complication (HCC)    Hypertension    Thyroid  disease    Past Surgical History:  Procedure Laterality Date   COLONOSCOPY WITH PROPOFOL      COLONOSCOPY WITH PROPOFOL  N/A 10/15/2021   Procedure: COLONOSCOPY WITH PROPOFOL ;  Surgeon: Luke Salaam, MD;  Location: [facility] ENDOSCOPY;  Service: Gastroenterology;  Laterality: N/A;   HERNIA REPAIR     INSERTION OF MESH  08/27/2021   Procedure: INSERTION OF MESH;  Surgeon: Alben Alma, MD;  Location: [facility] ORS;  Service: General;;   Patient Active Problem List   Diagnosis Date Noted   Hernia of scrotum 11/02/2020   Unilateral recurrent inguinal hernia without obstruction or gangrene 11/02/2020   Stage 3a chronic kidney disease (HCC) 08/14/2020   Annual physical exam 08/02/2020   Abrasion of right hand 08/02/2020   Primary osteoarthritis of right knee 06/12/2020   Hypothyroidism due to Hashimoto's thyroiditis 02/10/2020   Dyslipidemia 02/10/2020   Essential hypertension 02/07/2020    ONSET DATE: Reports steady decline over past 2 years  REFERRING DIAG:  G62.9 (ICD-10-CM) - Polyneuropathy  R26.9 (ICD-10-CM) - Gait abnormality  R26.89 (ICD-10-CM) - Balance disorder    THERAPY DIAG:  No diagnosis found.  Rationale for Evaluation and Treatment:  Rehabilitation  SUBJECTIVE:                                                                                                                                                                                             SUBJECTIVE STATEMENT:  Pt reports no falls since last session. Hopes to have a successful time line dancing between now and next session.    From Initial Eval: Pt reports steady decline in his balance over the past 2 years after he retired. 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 Pt denies falls, but states he "walks like a drunk man." Pt wearing nice dressier shoes today. Pt reports increased difficulty with any household tasks that requires him to stand, due to his balance, but primarily is disappointed he can no longer dance.   Reports it feels like he is "walking on sponges" from where his feet are "numb." Pt states standing in 1 place causes his feet to be more numb.  Pt accompanied by: self  PERTINENT HISTORY: Pt reports hx of hernial operation and prior R hand/wrist fx with no residual deficits, but otherwise healthy PMH: HTN, thyroid  disease, and diabetes  PAIN:  Are you having pain? No  PRECAUTIONS: Fall  RED FLAGS: None   WEIGHT BEARING RESTRICTIONS: No  FALLS: Has patient fallen in last 6 months? No and states he "walks like a drunk" and stumbles a lot  LIVING ENVIRONMENT: Lives with: lives alone Lives in: House/apartment Stairs: Yes: External: 3 steps; none or can enter through the garage with 1 step-up to enter Has following equipment at home: built in shower seat  PLOF: Independent with basic ADLs, Independent with homemaking with ambulation, and Independent with gait  PATIENT GOALS: prevent his balance from getting worse and ideally go back to work and return to dancing  OBJECTIVE:  Note: Objective measures were completed at Evaluation unless otherwise noted.  DIAGNOSTIC FINDINGS:   EMG Impression: Abnormal study. 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  Electronically signed by Roby Chris, MD at 07/24/2023 12:08 PM EST   COGNITION: Overall cognitive status: Within functional limits for tasks assessed   SENSATION: Light touch: Impaired bilaterally with R LE more impaired than L Proprioception: not formally assessed, but appears impaired during functional mobility  COORDINATION: WFL heel to shin  EDEMA:  None observed on eval  MUSCLE TONE: appears WNL  MUSCLE LENGTH: Not formally assessed  DTRs:  Patella: may benefit from testing Achilles: may benefit from testing  POSTURE: rounded shoulders, forward head, and posterior pelvic tilt  LOWER EXTREMITY ROM:     Active   WFL throughout B LEs Right Eval Left Eval  Hip flexion    Hip extension    Hip abduction    Hip adduction    Hip internal rotation    Hip external rotation    Knee flexion    Knee extension    Ankle dorsiflexion    Ankle plantarflexion    Ankle inversion    Ankle eversion     (Blank rows = not tested)  LOWER EXTREMITY MMT:    MMT Right Eval Left Eval  Hip flexion 5 5  Hip extension    Hip abduction    Hip adduction    Hip internal rotation    Hip external rotation    Knee flexion 5 5  Knee extension 5 5  Ankle dorsiflexion 4- 4-  Ankle plantarflexion 4- 4-  Ankle inversion    Ankle eversion    (Blank rows = not tested)  Manual Muscle Test Scale 0/5 = No muscle contraction can be seen or felt 1/5 = Contraction can be felt, but there is no motion 2-/5 = Part moves through incomplete ROM w/ gravity decreased 2/5 = Part moves through complete ROM w/ gravity decreased 2+/5 = Part moves through incomplete ROM (<50%) against gravity or through complete ROM w/ gravity 3-/5 = Part moves through incomplete ROM (>50%) against  gravity 3/5 = Part moves through complete ROM against  gravity 3+/5 = Part moves through complete ROM against gravity/slight resistance 4-/5= Holds test position against slight to moderate pressure 4/5 = Part moves through complete ROM against gravity/moderate resistance 4+/5= Holds test position against moderate to strong pressure 5/5 = Part moves through complete ROM against gravity/full resistance  BED MOBILITY:  Findings: not formally assessed, but not reports of difficulty  TRANSFERS: Sit to stand: CGA  Assistive device utilized: None     Stand to sit: CGA  Assistive device utilized: None     Chair to chair: CGA  Assistive device utilized: None       RAMP:  Not tested, but would benefit from testing  CURB:  Not tested, but would benefit from testing  STAIRS: Need to assess GAIT: Findings: Gait Characteristics: excessive posterior lean throughout with his weight primarily through his heels, step through pattern, decreased step length- Right, decreased step length- Left, decreased stride length, and narrow BOS, Distance walked: ~189ft,  Assistive device utilized:None Level of assistance: CGA Comments: excessive heel strike with lateral bias and pt doesn't transfer weight through midfoot nor forefoot during stance, excessive posterior pelvic tilt throughout with compensatory excessive thoracic rounding to prevent posterior LOB  FUNCTIONAL TESTS:  6 minute walk test: need to assess 10 meter walk test: 0.751m/s Berg Balance Scale: 34/56 Functional gait assessment: need to assess  PATIENT SURVEYS:  ABC scale 56.25% with pt reporting primarily 50% confident with majority of the items including walking around the house - pt reporting he is unsteady with all standing tasks                                                                                                                              TREATMENT DATE: 01/28/2024  NMR/ Gait training Standing on rocker board ant/ post x 2 min with UE support   - same with lateral movements  of board   Seated heel raise on incline with 2.5 AW 2*20   Seated inversion of ankle on lateral lateral incline ( foot starts in eversion)  x 20 ea LE  Gait in // bars with ladder, forward and retro x 6 rounds, UE support, improved toe contact with cues    Gait with focus on toe off and retro gait to improve toe contact and balance 2 x 40 ft   -good toe contact with retro gait, still similar ant gait pattern despite cues   Standing with 1 LE on 6 degree decline in frontal plane with opposite LE on 2 x airex pads in order to encourage weightbearing through medial forefoot, x 45 sec ea LE   Standing on 25 degree incline with basket ant for anterior reach x 20 reps, UE assist required otherwise pt unable to complete and even attempts to step off board anteriorly to complete task.   PATIENT EDUCATION: Education details: Therapy POC, findings on evaluation today, performance of daily foot  assessments, balance systems Person educated: Patient Education method: Explanation Education comprehension: verbalized understanding and needs further education  HOME EXERCISE PROGRAM:  Access Code: [email or web address] URL: [email or web address] Date: 01/14/2024 Prepared by: Carlen Chasten  Exercises - Wide Stance with Counter Support  - 1 x daily - 7 x weekly - 3 sets - 30 seconds hold - Sit to Stand with Counter Support  - 1 x daily - 7 x weekly - 2 sets - 10 reps - Seated Ankle Plantar Flexion with Resistance Loop  - 1 x daily - 7 x weekly - 2 sets - 10 reps - Seated Ankle Eversion with Anchored Resistance  - 1 x daily - 7 x weekly - 2 sets - 10 reps   GOALS: Goals reviewed with patient? Yes  SHORT TERM GOALS: Target date: 02/22/2024  Patient will be independent in home exercise program to improve strength/mobility and balance for better functional independence with ADLs.  Baseline: need to initiate Goal status: INITIAL   LONG TERM GOALS: Target date: 04/04/2024  Patient will increase  Berg Balance score to > 51/56 to demonstrate improved balance and decreased fall risk during functional activities and ADLs.  Baseline: 34/56 Goal status: INITIAL  2.  Patient will increase Functional Gait Assessment (FGA) score to >20/30 as to reduce fall risk and improve dynamic gait safety with community ambulation.  Baseline: 5/30 due to pt frequently reaching for wall support from being unbalanced Goal status: INITIAL  3.  Patient will increase 10 meter walk test to >1.62m/s as to improve gait speed for better community ambulation and to reduce fall risk. Baseline: 0.737m/s without AD, CGA Goal status: INITIAL  4.  Patient will increase six minute walk test distance to >1000 for progression to community ambulator and improve gait ability  Baseline:  699 feet having to stop the test at 50min16sec due to fatigue (213 meters, Avg speed 0.97m/s) no AD, with CGA and pt frequently reaching for the wall to support his imbalance Goal status: INITIAL  5.  Patient will increase ABC scale score >80% to demonstrate better functional mobility and better confidence with ADLs.  Baseline: 56.25% Goal status: INITIAL   ASSESSMENT:  CLINICAL IMPRESSION:  Patient arrived with good motivation for completion of pt activities.  PT focusses on activities to improve weightbearing through ant and medial foot. Also challenged pt with various tasks to improve activation of ankle musculature for improved control of foot. Pt will continue to benefit from skilled physical therapy intervention to address impairments, improve QOL, and attain therapy goals.    OBJECTIVE IMPAIRMENTS: Abnormal gait, decreased balance, decreased knowledge of condition, decreased knowledge of use of DME, decreased mobility, difficulty walking, decreased strength, impaired sensation, and improper body mechanics.   ACTIVITY LIMITATIONS: carrying, lifting, bending, standing, squatting, stairs, transfers, bathing, toileting, dressing,  hygiene/grooming, and locomotion level  PARTICIPATION LIMITATIONS: meal prep, cleaning, laundry, shopping, community activity, occupation, yard work, and dancing  PERSONAL FACTORS: Age, Time since onset of injury/illness/exacerbation, and 3+ comorbidities: HTN, thyroid  disease, and diabetes are also affecting patient's functional outcome.   REHAB POTENTIAL: Good  CLINICAL DECISION MAKING: Stable/uncomplicated  EVALUATION COMPLEXITY: Moderate  PLAN:  PT FREQUENCY: 1-2x/week  PT DURATION: 12 weeks  PLANNED INTERVENTIONS: 97164- PT Re-evaluation, 97750- Physical Performance Testing, 97110-Therapeutic exercises, 97530- Therapeutic activity, [id number]- Neuromuscular re-education, 97535- Self Care, [id number]- Manual therapy, [id number]- Gait training, [phone]- Canalith repositioning, [phone]- Electrical stimulation (manual), Patient/Family education, Balance training, Stair training, Joint mobilization, Vestibular training, DME instructions, Cryotherapy, Moist heat, and Biofeedback  PLAN  FOR NEXT SESSION:  - review HEP and progress in next few sessions  - balance interventions with narrow BOS/semi-tandem, eyes closed as appropriate, daily walking, etc. - follow-up on diabetes education - follow-up on education regarding daily foot checks - standing balance focusing on foot flat as pt tends to have posterior lean/wt shift into heels - gait training on treadmill with improved foot rocker, rolling through toes during terminal stance/pre-swing    Edwina Gram PT ,DPT Physical Therapist- [place]  [facility]   2:21 PM 01/28/24

## 2024-02-01 ENCOUNTER — Ambulatory Visit: Admitting: Physical Therapy

## 2024-02-01 DIAGNOSIS — R531 Weakness: Secondary | ICD-10-CM

## 2024-02-01 DIAGNOSIS — R269 Unspecified abnormalities of gait and mobility: Secondary | ICD-10-CM

## 2024-02-01 DIAGNOSIS — R262 Difficulty in walking, not elsewhere classified: Secondary | ICD-10-CM | POA: Diagnosis not present

## 2024-02-01 DIAGNOSIS — R2681 Unsteadiness on feet: Secondary | ICD-10-CM

## 2024-02-01 NOTE — Therapy (Signed)
 OUTPATIENT PHYSICAL THERAPY NEURO TREATMENT   Patient Name: Adam Castillo MRN: [id number] DOB:[birth year], 78 y.o., male Today's Date: 02/01/2024   PCP: Jacques Mattock, PA-C  REFERRING PROVIDER: Bufford Carne, MD   END OF SESSION:   PT End of Session - 02/01/24 1531     Visit Number 6    Number of Visits 24    Date for PT Re-Evaluation 04/04/24    PT Start Time 1532    PT Stop Time 1615    PT Time Calculation (min) 43 min    Equipment Utilized During Treatment Gait belt    Activity Tolerance Patient tolerated treatment well    Behavior During Therapy WFL for tasks assessed/performed                 Past Medical History:  Diagnosis Date   Diabetes mellitus without complication (HCC)    Hypertension    Thyroid  disease    Past Surgical History:  Procedure Laterality Date   COLONOSCOPY WITH PROPOFOL      COLONOSCOPY WITH PROPOFOL  N/A 10/15/2021   Procedure: COLONOSCOPY WITH PROPOFOL ;  Surgeon: Luke Salaam, MD;  Location: [facility] ENDOSCOPY;  Service: Gastroenterology;  Laterality: N/A;   HERNIA REPAIR     INSERTION OF MESH  08/27/2021   Procedure: INSERTION OF MESH;  Surgeon: Alben Alma, MD;  Location: [facility] ORS;  Service: General;;   Patient Active Problem List   Diagnosis Date Noted   Hernia of scrotum 11/02/2020   Unilateral recurrent inguinal hernia without obstruction or gangrene 11/02/2020   Stage 3a chronic kidney disease (HCC) 08/14/2020   Annual physical exam 08/02/2020   Abrasion of right hand 08/02/2020   Primary osteoarthritis of right knee 06/12/2020   Hypothyroidism due to Hashimoto's thyroiditis 02/10/2020   Dyslipidemia 02/10/2020   Essential hypertension 02/07/2020    ONSET DATE: Reports steady decline over past 2 years  REFERRING DIAG:  G62.9 (ICD-10-CM) - Polyneuropathy  R26.9 (ICD-10-CM) - Gait abnormality  R26.89 (ICD-10-CM) - Balance disorder    THERAPY DIAG:  Difficulty in walking  Abnormality of gait  Unsteadiness on  feet  Generalized weakness  Rationale for Evaluation and Treatment: Rehabilitation  SUBJECTIVE:                                                                                                                                                                                             SUBJECTIVE STATEMENT:  Pt reports no falls since last session, but noted that he is still "walking like a drunk man". Was able to go dancing over the weekend. No other updates.    From Initial Eval: Pt  reports steady decline in his balance over the past 2 years after he retired. 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 Pt denies falls, but states he "walks like a drunk man." Pt wearing nice dressier shoes today. Pt reports increased difficulty with any household tasks that requires him to stand, due to his balance, but primarily is disappointed he can no longer dance.   Reports it feels like he is "walking on sponges" from where his feet are "numb." Pt states standing in 1 place causes his feet to be more numb.  Pt accompanied by: self  PERTINENT HISTORY: Pt reports hx of hernial operation and prior R hand/wrist fx with no residual deficits, but otherwise healthy PMH: HTN, thyroid  disease, and diabetes  PAIN:  Are you having pain? No  PRECAUTIONS: Fall  RED FLAGS: None   WEIGHT BEARING RESTRICTIONS: No  FALLS: Has patient fallen in last 6 months? No and states he "walks like a drunk" and stumbles a lot  LIVING ENVIRONMENT: Lives with: lives alone Lives in: House/apartment Stairs: Yes: External: 3 steps; none or can enter through the garage with 1 step-up to enter Has following equipment at home: built in shower seat  PLOF: Independent with basic ADLs, Independent with homemaking with ambulation, and Independent with gait  PATIENT GOALS: prevent his balance from getting worse and ideally go back to work and return to dancing  OBJECTIVE:   Note: Objective measures were completed at Evaluation unless otherwise noted.  DIAGNOSTIC FINDINGS:  EMG Impression: Abnormal study. 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  Electronically signed by Roby Chris, MD at 07/24/2023 12:08 PM EST   COGNITION: Overall cognitive status: Within functional limits for tasks assessed   SENSATION: Light touch: Impaired bilaterally with R LE more impaired than L Proprioception: not formally assessed, but appears impaired during functional mobility  COORDINATION: WFL heel to shin  EDEMA:  None observed on eval  MUSCLE TONE: appears WNL  MUSCLE LENGTH: Not formally assessed  DTRs:  Patella: may benefit from testing Achilles: may benefit from testing  POSTURE: rounded shoulders, forward head, and posterior pelvic tilt  LOWER EXTREMITY ROM:     Active   WFL throughout B LEs Right Eval Left Eval  Hip flexion    Hip extension    Hip abduction    Hip adduction    Hip internal rotation    Hip external rotation    Knee flexion    Knee extension    Ankle dorsiflexion    Ankle plantarflexion    Ankle inversion    Ankle eversion     (Blank rows = not tested)  LOWER EXTREMITY MMT:    MMT Right Eval Left Eval  Hip flexion 5 5  Hip extension    Hip abduction    Hip adduction    Hip internal rotation    Hip external rotation    Knee flexion 5 5  Knee extension 5 5  Ankle dorsiflexion 4- 4-  Ankle plantarflexion 4- 4-  Ankle inversion    Ankle eversion    (Blank rows = not tested)  Manual Muscle Test Scale 0/5 = No muscle contraction can be seen or felt 1/5 = Contraction can be felt, but there is no motion 2-/5 = Part moves through incomplete ROM w/ gravity decreased 2/5 = Part moves through complete ROM w/ gravity decreased 2+/5 = Part moves through  incomplete ROM (<50%) against gravity or through complete ROM w/ gravity 3-/5 = Part  moves through incomplete ROM (>50%) against gravity 3/5 = Part moves through complete ROM against gravity 3+/5 = Part moves through complete ROM against gravity/slight resistance 4-/5= Holds test position against slight to moderate pressure 4/5 = Part moves through complete ROM against gravity/moderate resistance 4+/5= Holds test position against moderate to strong pressure 5/5 = Part moves through complete ROM against gravity/full resistance  BED MOBILITY:  Findings: not formally assessed, but not reports of difficulty  TRANSFERS: Sit to stand: CGA  Assistive device utilized: None     Stand to sit: CGA  Assistive device utilized: None     Chair to chair: CGA  Assistive device utilized: None       RAMP:  Not tested, but would benefit from testing  CURB:  Not tested, but would benefit from testing  STAIRS: Need to assess GAIT: Findings: Gait Characteristics: excessive posterior lean throughout with his weight primarily through his heels, step through pattern, decreased step length- Right, decreased step length- Left, decreased stride length, and narrow BOS, Distance walked: ~199ft,  Assistive device utilized:None Level of assistance: CGA Comments: excessive heel strike with lateral bias and pt doesn't transfer weight through midfoot nor forefoot during stance, excessive posterior pelvic tilt throughout with compensatory excessive thoracic rounding to prevent posterior LOB  FUNCTIONAL TESTS:  6 minute walk test: need to assess 10 meter walk test: 0.713m/s Berg Balance Scale: 34/56 Functional gait assessment: need to assess  PATIENT SURVEYS:  ABC scale 56.25% with pt reporting primarily 50% confident with majority of the items including walking around the house - pt reporting he is unsteady with all standing tasks                                                                                                                              TREATMENT DATE: 02/01/2024  NMR/ Gait  training  Seated: DF/PF over half bolster with emphasis on increased ROM into PF.  Seated heel raise on wedge x 20 bil  Ankle inversion/eversion/neutral x 12 bil pt holding knee to reduce hip compensation. Standing forward lunge with UE support on rail, cues for improved weight through forefoot on tail leg.  Standing weight shift in staggered stance with cues for press into heel raise on trail LE with improved upright posture.   Gait pushing weighted transport chair 2 x 119ft with cues for improved step length by forcing increased hip extension and toe off in stance.  Gait without resistance 2 x 127ft with cues for improved hip extension and stance time on each BLE. Mild improvement in toe off on BLE; L>E. But without instructionfrom PT, was noted to continue to bear weight through heels and lateral edge of feet only.    PATIENT EDUCATION: Education details: Therapy POC, findings on evaluation today, performance of daily foot assessments, balance systems Person educated: Patient Education method: Explanation Education comprehension: verbalized  understanding and needs further education  HOME EXERCISE PROGRAM:  Access Code: [email or web address] URL: [email or web address] Date: 01/14/2024 Prepared by: Carlen Chasten  Exercises - Wide Stance with Counter Support  - 1 x daily - 7 x weekly - 3 sets - 30 seconds hold - Sit to Stand with Counter Support  - 1 x daily - 7 x weekly - 2 sets - 10 reps - Seated Ankle Plantar Flexion with Resistance Loop  - 1 x daily - 7 x weekly - 2 sets - 10 reps - Seated Ankle Eversion with Anchored Resistance  - 1 x daily - 7 x weekly - 2 sets - 10 reps   GOALS: Goals reviewed with patient? Yes  SHORT TERM GOALS: Target date: 02/22/2024  Patient will be independent in home exercise program to improve strength/mobility and balance for better functional independence with ADLs.  Baseline: need to initiate Goal status: INITIAL   LONG TERM GOALS: Target  date: 04/04/2024  Patient will increase Berg Balance score to > 51/56 to demonstrate improved balance and decreased fall risk during functional activities and ADLs.  Baseline: 34/56 Goal status: INITIAL  2.  Patient will increase Functional Gait Assessment (FGA) score to >20/30 as to reduce fall risk and improve dynamic gait safety with community ambulation.  Baseline: 5/30 due to pt frequently reaching for wall support from being unbalanced Goal status: INITIAL  3.  Patient will increase 10 meter walk test to >1.60m/s as to improve gait speed for better community ambulation and to reduce fall risk. Baseline: 0.765m/s without AD, CGA Goal status: INITIAL  4.  Patient will increase six minute walk test distance to >1000 for progression to community ambulator and improve gait ability  Baseline:  699 feet having to stop the test at 51min16sec due to fatigue (213 meters, Avg speed 0.71m/s) no AD, with CGA and pt frequently reaching for the wall to support his imbalance Goal status: INITIAL  5.  Patient will increase ABC scale score >80% to demonstrate better functional mobility and better confidence with ADLs.  Baseline: 56.25% Goal status: INITIAL   ASSESSMENT:  CLINICAL IMPRESSION:  Patient arrived with good motivation for completion of pt activities.  PT focusses on activities to improve weightbearing through ant and medial foot. Continued to also Also challenged pt with various tasks to improve activation of ankle musculature for improved control of foot. Mild improvement in toe off following instruction from PT while Pushing weighted chair.   Pt will continue to benefit from skilled physical therapy intervention to address impairments, improve QOL, and attain therapy goals.    OBJECTIVE IMPAIRMENTS: Abnormal gait, decreased balance, decreased knowledge of condition, decreased knowledge of use of DME, decreased mobility, difficulty walking, decreased strength, impaired sensation, and  improper body mechanics.   ACTIVITY LIMITATIONS: carrying, lifting, bending, standing, squatting, stairs, transfers, bathing, toileting, dressing, hygiene/grooming, and locomotion level  PARTICIPATION LIMITATIONS: meal prep, cleaning, laundry, shopping, community activity, occupation, yard work, and dancing  PERSONAL FACTORS: Age, Time since onset of injury/illness/exacerbation, and 3+ comorbidities: HTN, thyroid  disease, and diabetes are also affecting patient's functional outcome.   REHAB POTENTIAL: Good  CLINICAL DECISION MAKING: Stable/uncomplicated  EVALUATION COMPLEXITY: Moderate  PLAN:  PT FREQUENCY: 1-2x/week  PT DURATION: 12 weeks  PLANNED INTERVENTIONS: 97164- PT Re-evaluation, 97750- Physical Performance Testing, 97110-Therapeutic exercises, 97530- Therapeutic activity, [id number]- Neuromuscular re-education, 97535- Self Care, 16109- Manual therapy, [id number]- Gait training, [phone]- Canalith repositioning, [phone]- Electrical stimulation (manual), Patient/Family education, Balance training, Stair training, Joint mobilization, Vestibular training, DME instructions, Cryotherapy,  Moist heat, and Biofeedback  PLAN FOR NEXT SESSION:  - review HEP and progress in next few sessions  - balance interventions with narrow BOS/semi-tandem, eyes closed as appropriate, daily walking, etc. - standing balance focusing on foot flat as pt tends to have posterior lean/wt shift into heels - gait training with improved foot rocker, rolling through toes during terminal stance/pre-swing    Barbara Book PT ,DPT Physical Therapist- [place]  [facility]   3:31 PM 02/01/24

## 2024-02-04 ENCOUNTER — Ambulatory Visit: Admitting: Physical Therapy

## 2024-02-04 DIAGNOSIS — R531 Weakness: Secondary | ICD-10-CM

## 2024-02-04 DIAGNOSIS — R2681 Unsteadiness on feet: Secondary | ICD-10-CM

## 2024-02-04 DIAGNOSIS — R262 Difficulty in walking, not elsewhere classified: Secondary | ICD-10-CM

## 2024-02-04 DIAGNOSIS — R269 Unspecified abnormalities of gait and mobility: Secondary | ICD-10-CM

## 2024-02-04 NOTE — Therapy (Signed)
 OUTPATIENT PHYSICAL THERAPY NEURO TREATMENT   Patient Name: Adam Castillo MRN: [id number] DOB:Mar 21, 1946, 78 y.o., male Today's Date: 02/04/2024   PCP: Jacques Mattock, PA-C  REFERRING PROVIDER: Bufford Carne, MD   END OF SESSION:   PT End of Session - 02/04/24 1315     Visit Number 7    Number of Visits 24    Date for PT Re-Evaluation 04/04/24    PT Start Time 1316    PT Stop Time 1402    PT Time Calculation (min) 46 min    Equipment Utilized During Treatment Gait belt    Activity Tolerance Patient tolerated treatment well    Behavior During Therapy WFL for tasks assessed/performed              Past Medical History:  Diagnosis Date   Diabetes mellitus without complication (HCC)    Hypertension    Thyroid  disease    Past Surgical History:  Procedure Laterality Date   COLONOSCOPY WITH PROPOFOL      COLONOSCOPY WITH PROPOFOL  N/A 10/15/2021   Procedure: COLONOSCOPY WITH PROPOFOL ;  Surgeon: Luke Salaam, MD;  Location: [facility];  Service: Gastroenterology;  Laterality: N/A;   HERNIA REPAIR     INSERTION OF MESH  08/27/2021   Procedure: INSERTION OF MESH;  Surgeon: Alben Alma, MD;  Location: [facility] ORS;  Service: General;;   Patient Active Problem List   Diagnosis Date Noted   Hernia of scrotum 11/02/2020   Unilateral recurrent inguinal hernia without obstruction or gangrene 11/02/2020   Stage 3a chronic kidney disease (HCC) 08/14/2020   Annual physical exam 08/02/2020   Abrasion of right hand 08/02/2020   Primary osteoarthritis of right knee 06/12/2020   Hypothyroidism due to Hashimoto's thyroiditis 02/10/2020   Dyslipidemia 02/10/2020   Essential hypertension 02/07/2020    ONSET DATE: Reports steady decline over past 2 years  REFERRING DIAG:  G62.9 (ICD-10-CM) - Polyneuropathy  R26.9 (ICD-10-CM) - Gait abnormality  R26.89 (ICD-10-CM) - Balance disorder    THERAPY DIAG:  Difficulty in walking  Abnormality of gait  Unsteadiness on  feet  Generalized weakness  Rationale for Evaluation and Treatment: Rehabilitation  SUBJECTIVE:                                                                                                                                                                                             SUBJECTIVE STATEMENT:  Pt states he is doing "alright." States he can't tell a big difference in his balance. Pt states he has only performed HEP 2x/week otherwise he tries to walk as much as he can.  Denies falls.  From Initial Eval: Pt reports steady decline in his balance over the past 2 years after he retired. 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 Pt denies falls, but states he "walks like a drunk man." Pt wearing nice dressier shoes today. Pt reports increased difficulty with any household tasks that requires him to stand, due to his balance, but primarily is disappointed he can no longer dance.   Reports it feels like he is "walking on sponges" from where his feet are "numb." Pt states standing in 1 place causes his feet to be more numb.  Pt accompanied by: self  PERTINENT HISTORY: Pt reports hx of hernial operation and prior R hand/wrist fx with no residual deficits, but otherwise healthy PMH: HTN, thyroid  disease, and diabetes  PAIN:  Are you having pain? No  PRECAUTIONS: Fall  RED FLAGS: None   WEIGHT BEARING RESTRICTIONS: No  FALLS: Has patient fallen in last 6 months? No and states he "walks like a drunk" and stumbles a lot  LIVING ENVIRONMENT: Lives with: lives alone Lives in: House/apartment Stairs: Yes: External: 3 steps; none or can enter through the garage with 1 step-up to enter Has following equipment at home: built in shower seat  PLOF: Independent with basic ADLs, Independent with homemaking with ambulation, and Independent with gait  PATIENT GOALS: prevent his balance from getting worse and ideally go back to work  and return to dancing  OBJECTIVE:  Note: Objective measures were completed at Evaluation unless otherwise noted.  DIAGNOSTIC FINDINGS:  EMG Impression: Abnormal study. 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  Electronically signed by Roby Chris, MD at 07/24/2023 12:08 PM EST   COGNITION: Overall cognitive status: Within functional limits for tasks assessed   SENSATION: Light touch: Impaired bilaterally with R LE more impaired than L Proprioception: not formally assessed, but appears impaired during functional mobility  COORDINATION: WFL heel to shin  EDEMA:  None observed on eval  MUSCLE TONE: appears WNL  MUSCLE LENGTH: Not formally assessed  DTRs:  Patella: may benefit from testing Achilles: may benefit from testing  POSTURE: rounded shoulders, forward head, and posterior pelvic tilt  LOWER EXTREMITY ROM:     Active   WFL throughout B LEs Right Eval Left Eval  Hip flexion    Hip extension    Hip abduction    Hip adduction    Hip internal rotation    Hip external rotation    Knee flexion    Knee extension    Ankle dorsiflexion    Ankle plantarflexion    Ankle inversion    Ankle eversion     (Blank rows = not tested)  LOWER EXTREMITY MMT:    MMT Right Eval Left Eval  Hip flexion 5 5  Hip extension    Hip abduction    Hip adduction    Hip internal rotation    Hip external rotation    Knee flexion 5 5  Knee extension 5 5  Ankle dorsiflexion 4- 4-  Ankle plantarflexion 4- 4-  Ankle inversion    Ankle eversion    (Blank rows = not tested)  Manual Muscle Test Scale 0/5 = No muscle contraction can be seen or felt 1/5 = Contraction can be felt, but there is no motion 2-/5 = Part moves through incomplete ROM w/ gravity decreased 2/5 = Part moves through complete ROM w/ gravity decreased 2+/5 =  Part moves through incomplete ROM (<50%) against gravity or through  complete ROM w/ gravity 3-/5 = Part moves through incomplete ROM (>50%) against gravity 3/5 = Part moves through complete ROM against gravity 3+/5 = Part moves through complete ROM against gravity/slight resistance 4-/5= Holds test position against slight to moderate pressure 4/5 = Part moves through complete ROM against gravity/moderate resistance 4+/5= Holds test position against moderate to strong pressure 5/5 = Part moves through complete ROM against gravity/full resistance  BED MOBILITY:  Findings: not formally assessed, but not reports of difficulty  TRANSFERS: Sit to stand: CGA  Assistive device utilized: None     Stand to sit: CGA  Assistive device utilized: None     Chair to chair: CGA  Assistive device utilized: None       RAMP:  Not tested, but would benefit from testing  CURB:  Not tested, but would benefit from testing  STAIRS: Need to assess GAIT: Findings: Gait Characteristics: excessive posterior lean throughout with his weight primarily through his heels, step through pattern, decreased step length- Right, decreased step length- Left, decreased stride length, and narrow BOS, Distance walked: ~15ft,  Assistive device utilized:None Level of assistance: CGA Comments: excessive heel strike with lateral bias and pt doesn't transfer weight through midfoot nor forefoot during stance, excessive posterior pelvic tilt throughout with compensatory excessive thoracic rounding to prevent posterior LOB  FUNCTIONAL TESTS:  6 minute walk test: need to assess 10 meter walk test: 0.731m/s Berg Balance Scale: 34/56 Functional gait assessment: need to assess  PATIENT SURVEYS:  ABC scale 56.25% with pt reporting primarily 50% confident with majority of the items including walking around the house - pt reporting he is unsteady with all standing tasks                                                                                                                               TREATMENT DATE: 02/04/2024  Standing balance interventions at balance bar for safety, but only using UE support when needed, including:  Static standing on firm ground with focus on improved midline orientation (not leaning posteriorly) 2x30 sec Maintained improved midline posture as last seen by this therapist Transitioned to no UE support, leaning forward into therapist's support to place weight through toes then pushing through PFs to shift wt backwards to midline 2 x 6 reps  This was very challenging for pt with limited ability to use PF ankle strategy to shift weight posteriorly Staggered stance (like pre-gait stance) with focus on forward wt shift to place weight through toes 2x 30 sec with each foot forward This was challenging with pt feeling like he would have anterior LOB    Gait training ~140ft, no AD, walking against level 3 GTB resistance around waist to promoted increased anterior propulsion of pelvis and pushing through toes/PFs during push-off - cuing for improved toe-off.  Pt states has an appointment to follow-up with MD in next few weeks  regarding his blood work and urine sample. Pt states he checks his BS in morning and evenings and that it is WNL; however, unsure if the numbers pt is stating sound accurate (6.5?). Educated pt to discuss his BS further with MD and inquire about a more continuous monitoring system.   Educated pt on recommendation to purchase new shoes due to tread being worn down on lateral sides of bilateral heels.  Standing in staggered pre-gait stance (like above) with therapist providing GTB resistance around hips/pelvis pulling him posteriorly while pt having to weight shift forward and reach upwards towards target promoting anterior weight shift and trying to activate PFs, but pt still doesn't have sufficient strength to perform standing PF to lift heels 2x 10 reps per LE  Reiterated how to do seated PF against GTB resistance and educated on importance  of performing this at home - performed x6 reps per LE.    PATIENT EDUCATION: Education details: Therapy POC, findings on evaluation today, performance of daily foot assessments, balance systems Person educated: Patient Education method: Explanation Education comprehension: verbalized understanding and needs further education  HOME EXERCISE PROGRAM:  Access Code: [email or web address] URL: [email or web address] Date: 01/14/2024 Prepared by: Carlen Chasten  Exercises - Wide Stance with Counter Support  - 1 x daily - 7 x weekly - 3 sets - 30 seconds hold - Sit to Stand with Counter Support  - 1 x daily - 7 x weekly - 2 sets - 10 reps - Seated Ankle Plantar Flexion with Resistance Loop  - 1 x daily - 7 x weekly - 2 sets - 10 reps - Seated Ankle Eversion with Anchored Resistance  - 1 x daily - 7 x weekly - 2 sets - 10 reps   GOALS: Goals reviewed with patient? Yes  SHORT TERM GOALS: Target date: 02/22/2024  Patient will be independent in home exercise program to improve strength/mobility and balance for better functional independence with ADLs.  Baseline: need to initiate Goal status: INITIAL   LONG TERM GOALS: Target date: 04/04/2024  Patient will increase Berg Balance score to > 51/56 to demonstrate improved balance and decreased fall risk during functional activities and ADLs.  Baseline: 34/56 Goal status: INITIAL  2.  Patient will increase Functional Gait Assessment (FGA) score to >20/30 as to reduce fall risk and improve dynamic gait safety with community ambulation.  Baseline: 5/30 due to pt frequently reaching for wall support from being unbalanced Goal status: INITIAL  3.  Patient will increase 10 meter walk test to >1.59m/s as to improve gait speed for better community ambulation and to reduce fall risk. Baseline: 0.727m/s without AD, CGA Goal status: INITIAL  4.  Patient will increase six minute walk test distance to >1000 for progression to community ambulator and  improve gait ability  Baseline:  699 feet having to stop the test at 47min16sec due to fatigue (213 meters, Avg speed 0.90m/s) no AD, with CGA and pt frequently reaching for the wall to support his imbalance Goal status: INITIAL  5.  Patient will increase ABC scale score >80% to demonstrate better functional mobility and better confidence with ADLs.  Baseline: 56.25% Goal status: INITIAL   ASSESSMENT:  CLINICAL IMPRESSION:  Patient arrived with good motivation for completion of therapy interventions.  Therapy session continues to focus on interventions to improve weightbearing through ant and medial foot. Patient continues to demo primarily weightbearing through lateral sides of heels during gait; however, this is improving some with overall decreased posterior lean noted. Mild improvement  in toe off following gait training and weight shifting against GTB resistance. Pt will continue to benefit from skilled physical therapy intervention to address impairments, improve QOL, and attain therapy goals.    OBJECTIVE IMPAIRMENTS: Abnormal gait, decreased balance, decreased knowledge of condition, decreased knowledge of use of DME, decreased mobility, difficulty walking, decreased strength, impaired sensation, and improper body mechanics.   ACTIVITY LIMITATIONS: carrying, lifting, bending, standing, squatting, stairs, transfers, bathing, toileting, dressing, hygiene/grooming, and locomotion level  PARTICIPATION LIMITATIONS: meal prep, cleaning, laundry, shopping, community activity, occupation, yard work, and dancing  PERSONAL FACTORS: Age, Time since onset of injury/illness/exacerbation, and 3+ comorbidities: HTN, thyroid  disease, and diabetes are also affecting patient's functional outcome.   REHAB POTENTIAL: Good  CLINICAL DECISION MAKING: Stable/uncomplicated  EVALUATION COMPLEXITY: Moderate  PLAN:  PT FREQUENCY: 1-2x/week  PT DURATION: 12 weeks  PLANNED INTERVENTIONS: 97164- PT  Re-evaluation, 97750- Physical Performance Testing, 97110-Therapeutic exercises, 97530- Therapeutic activity, [id number]- Neuromuscular re-education, 97535- Self Care, 16109- Manual therapy, [phone]- Gait training, [phone]- Canalith repositioning, [phone]- Electrical stimulation (manual), Patient/Family education, Balance training, Stair training, Joint mobilization, Vestibular training, DME instructions, Cryotherapy, Moist heat, and Biofeedback  PLAN FOR NEXT SESSION:  - review HEP and progress in next few sessions  - balance interventions with narrow BOS/semi-tandem, eyes closed as appropriate, daily walking, etc. - standing balance focusing on foot flat as pt tends to have posterior lean/wt shift into heels - gait training with improved foot rocker, rolling through toes during terminal stance/pre-swing - staggered stance anterior weight shifting  Vincy Feliz, PT, DPT, NCS, CSRS Physical Therapist - [place]  [facility]  4:34 PM 02/04/24

## 2024-02-09 ENCOUNTER — Ambulatory Visit: Admitting: Physical Therapy

## 2024-02-09 DIAGNOSIS — R262 Difficulty in walking, not elsewhere classified: Secondary | ICD-10-CM | POA: Diagnosis not present

## 2024-02-09 DIAGNOSIS — R2681 Unsteadiness on feet: Secondary | ICD-10-CM

## 2024-02-09 DIAGNOSIS — R531 Weakness: Secondary | ICD-10-CM

## 2024-02-09 DIAGNOSIS — R269 Unspecified abnormalities of gait and mobility: Secondary | ICD-10-CM

## 2024-02-09 NOTE — Therapy (Unsigned)
 OUTPATIENT PHYSICAL THERAPY NEURO TREATMENT   Patient Name: Adam Castillo MRN: [id number] DOB:[birth year], 78 y.o., male Today's Date: 02/09/2024   PCP: Jacques Mattock, PA-C  REFERRING PROVIDER: Bufford Carne, MD   END OF SESSION:   PT End of Session - 02/09/24 1112     Visit Number 8    Number of Visits 24    Date for PT Re-Evaluation 04/04/24    PT Start Time 1115    PT Stop Time 1155    PT Time Calculation (min) 40 min    Equipment Utilized During Treatment Gait belt    Activity Tolerance Patient tolerated treatment well    Behavior During Therapy WFL for tasks assessed/performed              Past Medical History:  Diagnosis Date   Diabetes mellitus without complication (HCC)    Hypertension    Thyroid  disease    Past Surgical History:  Procedure Laterality Date   COLONOSCOPY WITH PROPOFOL      COLONOSCOPY WITH PROPOFOL  N/A 10/15/2021   Procedure: COLONOSCOPY WITH PROPOFOL ;  Surgeon: Luke Salaam, MD;  Location: [facility] ENDOSCOPY;  Service: Gastroenterology;  Laterality: N/A;   HERNIA REPAIR     INSERTION OF MESH  08/27/2021   Procedure: INSERTION OF MESH;  Surgeon: Alben Alma, MD;  Location: [facility] ORS;  Service: General;;   Patient Active Problem List   Diagnosis Date Noted   Hernia of scrotum 11/02/2020   Unilateral recurrent inguinal hernia without obstruction or gangrene 11/02/2020   Stage 3a chronic kidney disease (HCC) 08/14/2020   Annual physical exam 08/02/2020   Abrasion of right hand 08/02/2020   Primary osteoarthritis of right knee 06/12/2020   Hypothyroidism due to Hashimoto's thyroiditis 02/10/2020   Dyslipidemia 02/10/2020   Essential hypertension 02/07/2020    ONSET DATE: Reports steady decline over past 2 years  REFERRING DIAG:  G62.9 (ICD-10-CM) - Polyneuropathy  R26.9 (ICD-10-CM) - Gait abnormality  R26.89 (ICD-10-CM) - Balance disorder    THERAPY DIAG:  Difficulty in walking  Abnormality of gait  Unsteadiness on  feet  Generalized weakness  Rationale for Evaluation and Treatment: Rehabilitation  SUBJECTIVE:                                                                                                                                                                                             SUBJECTIVE STATEMENT:  Pt states he is doing well. Still feels like he is walking around "like a drunk" but overall feels  slightly better when he is walking around.  Pt does report a hx of having a "weak back", and  is curious of back issues could be causing profound numbness in feet, and if the neuropathy will continue to progress.   From Initial Eval: Pt reports steady decline in his balance over the past 2 years after he retired. 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 Pt denies falls, but states he "walks like a drunk man." Pt wearing nice dressier shoes today. Pt reports increased difficulty with any household tasks that requires him to stand, due to his balance, but primarily is disappointed he can no longer dance.   Reports it feels like he is "walking on sponges" from where his feet are "numb." Pt states standing in 1 place causes his feet to be more numb.  Pt accompanied by: self  PERTINENT HISTORY: Pt reports hx of hernial operation and prior R hand/wrist fx with no residual deficits, but otherwise healthy PMH: HTN, thyroid  disease, and diabetes  PAIN:  Are you having pain? No  PRECAUTIONS: Fall  RED FLAGS: None   WEIGHT BEARING RESTRICTIONS: No  FALLS: Has patient fallen in last 6 months? No and states he "walks like a drunk" and stumbles a lot  LIVING ENVIRONMENT: Lives with: lives alone Lives in: House/apartment Stairs: Yes: External: 3 steps; none or can enter through the garage with 1 step-up to enter Has following equipment at home: built in shower seat  PLOF: Independent with basic ADLs, Independent with homemaking with  ambulation, and Independent with gait  PATIENT GOALS: prevent his balance from getting worse and ideally go back to work and return to dancing  OBJECTIVE:  Note: Objective measures were completed at Evaluation unless otherwise noted.  DIAGNOSTIC FINDINGS:  EMG Impression: Abnormal study. 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  Electronically signed by Roby Chris, MD at 07/24/2023 12:08 PM EST   COGNITION: Overall cognitive status: Within functional limits for tasks assessed   SENSATION: Light touch: Impaired bilaterally with R LE more impaired than L Proprioception: not formally assessed, but appears impaired during functional mobility  COORDINATION: WFL heel to shin  EDEMA:  None observed on eval  MUSCLE TONE: appears WNL  MUSCLE LENGTH: Not formally assessed  DTRs:  Patella: may benefit from testing Achilles: may benefit from testing  POSTURE: rounded shoulders, forward head, and posterior pelvic tilt  LOWER EXTREMITY ROM:     Active   WFL throughout B LEs Right Eval Left Eval  Hip flexion    Hip extension    Hip abduction    Hip adduction    Hip internal rotation    Hip external rotation    Knee flexion    Knee extension    Ankle dorsiflexion    Ankle plantarflexion    Ankle inversion    Ankle eversion     (Blank rows = not tested)  LOWER EXTREMITY MMT:    MMT Right Eval Left Eval  Hip flexion 5 5  Hip extension    Hip abduction    Hip adduction    Hip internal rotation    Hip external rotation    Knee flexion 5 5  Knee extension 5 5  Ankle dorsiflexion 4- 4-  Ankle plantarflexion 4- 4-  Ankle inversion    Ankle eversion    (Blank rows = not tested)  Manual Muscle Test Scale 0/5 = No muscle contraction can be seen or felt 1/5 = Contraction can be felt, but there is no  motion 2-/5 = Part moves through incomplete ROM w/ gravity decreased 2/5 = Part moves  through complete ROM w/ gravity decreased 2+/5 = Part moves through incomplete ROM (<50%) against gravity or through complete ROM w/ gravity 3-/5 = Part moves through incomplete ROM (>50%) against gravity 3/5 = Part moves through complete ROM against gravity 3+/5 = Part moves through complete ROM against gravity/slight resistance 4-/5= Holds test position against slight to moderate pressure 4/5 = Part moves through complete ROM against gravity/moderate resistance 4+/5= Holds test position against moderate to strong pressure 5/5 = Part moves through complete ROM against gravity/full resistance  BED MOBILITY:  Findings: not formally assessed, but not reports of difficulty  TRANSFERS: Sit to stand: CGA  Assistive device utilized: None     Stand to sit: CGA  Assistive device utilized: None     Chair to chair: CGA  Assistive device utilized: None       RAMP:  Not tested, but would benefit from testing  CURB:  Not tested, but would benefit from testing  STAIRS: Need to assess GAIT: Findings: Gait Characteristics: excessive posterior lean throughout with his weight primarily through his heels, step through pattern, decreased step length- Right, decreased step length- Left, decreased stride length, and narrow BOS, Distance walked: ~173ft,  Assistive device utilized:None Level of assistance: CGA Comments: excessive heel strike with lateral bias and pt doesn't transfer weight through midfoot nor forefoot during stance, excessive posterior pelvic tilt throughout with compensatory excessive thoracic rounding to prevent posterior LOB  FUNCTIONAL TESTS:  6 minute walk test: need to assess 10 meter walk test: 0.771m/s Berg Balance Scale: 34/56 Functional gait assessment: need to assess  PATIENT SURVEYS:  ABC scale 56.25% with pt reporting primarily 50% confident with majority of the items including walking around the house - pt reporting he is unsteady with all standing tasks                                                                                                                               TREATMENT DATE: 02/09/2024   Reciprocal heel raise from wedge 2x 45 sec  Bil Ankle inversion/eversion 2 x 45 sec hands to stabilize the Bil knees to reduce compensation Forward step over line on floor x 10 bil min assist to  reduce fall risk Lateral step over line on floor x 10 bil min assist for improved safety Reciprocal Forward foot tap on 6inch step x 10with UE support and x 10 without UE support  lateral foot tap on 6inch step x 10 with UE support and x 10 without UE support  Gait on treadmill self-propelled by pt to force improved gluteal and gastroc activation to maintain forward progression. Cues for improved posture to allow full hip extension beyond neutral. Performed e5 x 30 sec with 30 sec rest break between bouts.  Sit<>stand without UE support x 10  Standing with 1/2 romberg 2 x 20 sec bil no UE  support.  Forward/reverse gait with rail in reach 77ft x 5   Cues for improved WB  and push through forefoot as tolerated by pt. Min/CGA for safety throughout session when no UE support from rail. Noted to keep pressure primarily in lateral/posterior foot but improved with instruction.    PATIENT EDUCATION: Education details: Therapy POC, findings on evaluation today, performance of daily foot assessments, balance systems Person educated: Patient Education method: Explanation Education comprehension: verbalized understanding and needs further education  HOME EXERCISE PROGRAM:  Access Code: [email or web address] URL: [email or web address] Date: 02/10/2024 Prepared by: Aurora Lees  Exercises - Wide Stance with Counter Support  - 1 x daily - 7 x weekly - 3 sets - 30 seconds hold - Sit to Stand with Counter Support  - 1 x daily - 7 x weekly - 2 sets - 10 reps - Seated Ankle Plantar Flexion with Resistance Loop  - 1 x daily - 7 x weekly - 2 sets - 10 reps - Seated Ankle  Eversion with Anchored Resistance  - 1 x daily - 7 x weekly - 2 sets - 10 reps - Standing Romberg to 1/2 Tandem Stance  - 1 x daily - 7 x weekly - 3 sets - 10 reps - 20 hold - Backward Walking with Counter Support  - 1 x daily - 7 x weekly - 3 sets - 10 reps - Side Lunge with Counter Support  - 1 x daily - 7 x weekly - 3 sets - 10 reps   GOALS: Goals reviewed with patient? Yes  SHORT TERM GOALS: Target date: 02/22/2024  Patient will be independent in home exercise program to improve strength/mobility and balance for better functional independence with ADLs.  Baseline: need to initiate Goal status: INITIAL   LONG TERM GOALS: Target date: 04/04/2024  Patient will increase Berg Balance score to > 51/56 to demonstrate improved balance and decreased fall risk during functional activities and ADLs.  Baseline: 34/56 Goal status: INITIAL  2.  Patient will increase Functional Gait Assessment (FGA) score to >20/30 as to reduce fall risk and improve dynamic gait safety with community ambulation.  Baseline: 5/30 due to pt frequently reaching for wall support from being unbalanced Goal status: INITIAL  3.  Patient will increase 10 meter walk test to >1.49m/s as to improve gait speed for better community ambulation and to reduce fall risk. Baseline: 0.72m/s without AD, CGA Goal status: INITIAL  4.  Patient will increase six minute walk test distance to >1000 for progression to community ambulator and improve gait ability  Baseline:  699 feet having to stop the test at 71min16sec due to fatigue (213 meters, Avg speed 0.45m/s) no AD, with CGA and pt frequently reaching for the wall to support his imbalance Goal status: INITIAL  5.  Patient will increase ABC scale score >80% to demonstrate better functional mobility and better confidence with ADLs.  Baseline: 56.25% Goal status: INITIAL   ASSESSMENT:  CLINICAL IMPRESSION:  Patient arrived with good motivation for completion of therapy  interventions.  Therapy session continues to focus on interventions to improve weightbearing through ant and medial foot. Tolerated SLS and narrow BOS tasks well today with expanded HEP. Pt with questions on possible back pathology  causing profound neuropathy, and curious if neuropathy will continue to progress.  Pt will continue to benefit from skilled physical therapy intervention to address impairments, improve QOL, and attain therapy goals.    OBJECTIVE IMPAIRMENTS: Abnormal gait, decreased balance, decreased knowledge of condition, decreased knowledge  of use of DME, decreased mobility, difficulty walking, decreased strength, impaired sensation, and improper body mechanics.   ACTIVITY LIMITATIONS: carrying, lifting, bending, standing, squatting, stairs, transfers, bathing, toileting, dressing, hygiene/grooming, and locomotion level  PARTICIPATION LIMITATIONS: meal prep, cleaning, laundry, shopping, community activity, occupation, yard work, and dancing  PERSONAL FACTORS: Age, Time since onset of injury/illness/exacerbation, and 3+ comorbidities: HTN, thyroid  disease, and diabetes are also affecting patient's functional outcome.   REHAB POTENTIAL: Good  CLINICAL DECISION MAKING: Stable/uncomplicated  EVALUATION COMPLEXITY: Moderate  PLAN:  PT FREQUENCY: 1-2x/week  PT DURATION: 12 weeks  PLANNED INTERVENTIONS: 97164- PT Re-evaluation, 97750- Physical Performance Testing, 97110-Therapeutic exercises, 97530- Therapeutic activity, 97112- Neuromuscular re-education, 97535- Self Care, [id number]- Manual therapy, [phone]- Gait training, [phone]- Canalith repositioning, [phone]- Electrical stimulation (manual), Patient/Family education, Balance training, Stair training, Joint mobilization, Vestibular training, DME instructions, Cryotherapy, Moist heat, and Biofeedback  PLAN FOR NEXT SESSION:   eyes closed as appropriate, daily walking, etc. - standing balance focusing on foot flat as pt tends to have  posterior lean/wt shift into heels as well as narrow stance - gait training with improved foot rocker, rolling through toes during terminal stance/pre-swing  Aurora Lees PT, DPT  Physical Therapist - [facility]  [facility]  8:26 AM 02/10/24

## 2024-02-11 ENCOUNTER — Ambulatory Visit: Admitting: Physical Therapy

## 2024-02-11 DIAGNOSIS — R269 Unspecified abnormalities of gait and mobility: Secondary | ICD-10-CM

## 2024-02-11 DIAGNOSIS — R2681 Unsteadiness on feet: Secondary | ICD-10-CM

## 2024-02-11 DIAGNOSIS — R531 Weakness: Secondary | ICD-10-CM

## 2024-02-11 DIAGNOSIS — R262 Difficulty in walking, not elsewhere classified: Secondary | ICD-10-CM

## 2024-02-11 NOTE — Therapy (Unsigned)
 OUTPATIENT PHYSICAL THERAPY NEURO TREATMENT   Patient Name: Adam Castillo MRN: [id number] DOB:1946-03-19, 78 y.o., male Today's Date: 02/11/2024   PCP: Jacques Mattock, PA-C  REFERRING PROVIDER: Bufford Carne, MD   END OF SESSION:   PT End of Session - 02/11/24 1408     Visit Number 9    Number of Visits 24    Date for PT Re-Evaluation 04/04/24    PT Start Time 1405    PT Stop Time 1445    PT Time Calculation (min) 40 min    Equipment Utilized During Treatment Gait belt    Activity Tolerance Patient tolerated treatment well    Behavior During Therapy WFL for tasks assessed/performed              Past Medical History:  Diagnosis Date   Diabetes mellitus without complication (HCC)    Hypertension    Thyroid  disease    Past Surgical History:  Procedure Laterality Date   COLONOSCOPY WITH PROPOFOL      COLONOSCOPY WITH PROPOFOL  N/A 10/15/2021   Procedure: COLONOSCOPY WITH PROPOFOL ;  Surgeon: Luke Salaam, MD;  Location: [facility] ENDOSCOPY;  Service: Gastroenterology;  Laterality: N/A;   HERNIA REPAIR     INSERTION OF MESH  08/27/2021   Procedure: INSERTION OF MESH;  Surgeon: Alben Alma, MD;  Location: [facility] ORS;  Service: General;;   Patient Active Problem List   Diagnosis Date Noted   Hernia of scrotum 11/02/2020   Unilateral recurrent inguinal hernia without obstruction or gangrene 11/02/2020   Stage 3a chronic kidney disease (HCC) 08/14/2020   Annual physical exam 08/02/2020   Abrasion of right hand 08/02/2020   Primary osteoarthritis of right knee 06/12/2020   Hypothyroidism due to Hashimoto's thyroiditis 02/10/2020   Dyslipidemia 02/10/2020   Essential hypertension 02/07/2020    ONSET DATE: Reports steady decline over past 2 years  REFERRING DIAG:  G62.9 (ICD-10-CM) - Polyneuropathy  R26.9 (ICD-10-CM) - Gait abnormality  R26.89 (ICD-10-CM) - Balance disorder    THERAPY DIAG:  Difficulty in walking  Abnormality of gait  Unsteadiness on  feet  Generalized weakness  Rationale for Evaluation and Treatment: Rehabilitation  SUBJECTIVE:                                                                                                                                                                                             SUBJECTIVE STATEMENT:  Pt states he is doing well. Still feels like he is walking around "like a drunk" but overall feels  slightly better when he is walking around.  Pt does report a hx of having a "weak back", and  is curious of back issues could be causing profound numbness in feet, and if the neuropathy will continue to progress.   From Initial Eval: Pt reports steady decline in his balance over the past 2 years after he retired. 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 Pt denies falls, but states he "walks like a drunk man." Pt wearing nice dressier shoes today. Pt reports increased difficulty with any household tasks that requires him to stand, due to his balance, but primarily is disappointed he can no longer dance.   Reports it feels like he is "walking on sponges" from where his feet are "numb." Pt states standing in 1 place causes his feet to be more numb.  Pt accompanied by: self  PERTINENT HISTORY: Pt reports hx of hernial operation and prior R hand/wrist fx with no residual deficits, but otherwise healthy PMH: HTN, thyroid  disease, and diabetes  PAIN:  Are you having pain? No  PRECAUTIONS: Fall  RED FLAGS: None   WEIGHT BEARING RESTRICTIONS: No  FALLS: Has patient fallen in last 6 months? No and states he "walks like a drunk" and stumbles a lot  LIVING ENVIRONMENT: Lives with: lives alone Lives in: House/apartment Stairs: Yes: External: 3 steps; none or can enter through the garage with 1 step-up to enter Has following equipment at home: built in shower seat  PLOF: Independent with basic ADLs, Independent with homemaking with  ambulation, and Independent with gait  PATIENT GOALS: prevent his balance from getting worse and ideally go back to work and return to dancing  OBJECTIVE:  Note: Objective measures were completed at Evaluation unless otherwise noted.  DIAGNOSTIC FINDINGS:  EMG Impression: Abnormal study. 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  Electronically signed by Roby Chris, MD at 07/24/2023 12:08 PM EST   COGNITION: Overall cognitive status: Within functional limits for tasks assessed   SENSATION: Light touch: Impaired bilaterally with R LE more impaired than L Proprioception: not formally assessed, but appears impaired during functional mobility  COORDINATION: WFL heel to shin  EDEMA:  None observed on eval  MUSCLE TONE: appears WNL  MUSCLE LENGTH: Not formally assessed  DTRs:  Patella: may benefit from testing Achilles: may benefit from testing  POSTURE: rounded shoulders, forward head, and posterior pelvic tilt  LOWER EXTREMITY ROM:     Active   WFL throughout B LEs Right Eval Left Eval  Hip flexion    Hip extension    Hip abduction    Hip adduction    Hip internal rotation    Hip external rotation    Knee flexion    Knee extension    Ankle dorsiflexion    Ankle plantarflexion    Ankle inversion    Ankle eversion     (Blank rows = not tested)  LOWER EXTREMITY MMT:    MMT Right Eval Left Eval  Hip flexion 5 5  Hip extension    Hip abduction    Hip adduction    Hip internal rotation    Hip external rotation    Knee flexion 5 5  Knee extension 5 5  Ankle dorsiflexion 4- 4-  Ankle plantarflexion 4- 4-  Ankle inversion    Ankle eversion    (Blank rows = not tested)  Manual Muscle Test Scale 0/5 = No muscle contraction can be seen or felt 1/5 = Contraction can be felt, but there is no  motion 2-/5 = Part moves through incomplete ROM w/ gravity decreased 2/5 = Part moves  through complete ROM w/ gravity decreased 2+/5 = Part moves through incomplete ROM (<50%) against gravity or through complete ROM w/ gravity 3-/5 = Part moves through incomplete ROM (>50%) against gravity 3/5 = Part moves through complete ROM against gravity 3+/5 = Part moves through complete ROM against gravity/slight resistance 4-/5= Holds test position against slight to moderate pressure 4/5 = Part moves through complete ROM against gravity/moderate resistance 4+/5= Holds test position against moderate to strong pressure 5/5 = Part moves through complete ROM against gravity/full resistance  BED MOBILITY:  Findings: not formally assessed, but not reports of difficulty  TRANSFERS: Sit to stand: CGA  Assistive device utilized: None     Stand to sit: CGA  Assistive device utilized: None     Chair to chair: CGA  Assistive device utilized: None       RAMP:  Not tested, but would benefit from testing  CURB:  Not tested, but would benefit from testing  STAIRS: Need to assess GAIT: Findings: Gait Characteristics: excessive posterior lean throughout with his weight primarily through his heels, step through pattern, decreased step length- Right, decreased step length- Left, decreased stride length, and narrow BOS, Distance walked: ~142ft,  Assistive device utilized:None Level of assistance: CGA Comments: excessive heel strike with lateral bias and pt doesn't transfer weight through midfoot nor forefoot during stance, excessive posterior pelvic tilt throughout with compensatory excessive thoracic rounding to prevent posterior LOB  FUNCTIONAL TESTS:  6 minute walk test: need to assess 10 meter walk test: 0.734m/s Berg Balance Scale: 34/56 Functional gait assessment: need to assess  PATIENT SURVEYS:  ABC scale 56.25% with pt reporting primarily 50% confident with majority of the items including walking around the house - pt reporting he is unsteady with all standing tasks                                                                                                                               TREATMENT DATE: 02/11/2024  Ankle PF YTB 2x 20; RTB x 20  Lateral lunge x 8 bil UE support on rail.  Wall plank 10-15 sec x 3  Side wall plank  10-15 sec x 3 bil  Instruction for TrA activation and to maintain neutral spine, with good carry over from rep to the next. Education on importance of spinal stability given hx of degenerative changes for >10 years.   Leg press ankle PF 25# 3 x 12 Able to press down to ~ 3 degree beyond neutral   Sit<>stand with UE support on rail with attempted to raise. Performed x 8 with minimal heel raise on each bout.  Tandem stance 20sec x 3 bil.    PATIENT EDUCATION: Education details: Therapy POC, findings on evaluation today, performance of daily foot assessments, balance systems Person educated: Patient Education method: Explanation Education comprehension: verbalized understanding and needs further education  HOME EXERCISE PROGRAM:  Access Code: [email or web address] URL: [email or web address] Date: 02/10/2024 Prepared by: Aurora Lees  Exercises - Wide Stance with Counter Support  - 1 x daily - 7 x weekly - 3 sets - 30 seconds hold - Sit to Stand with Counter Support  - 1 x daily - 7 x weekly - 2 sets - 10 reps - Seated Ankle Plantar Flexion with Resistance Loop  - 1 x daily - 7 x weekly - 2 sets - 10 reps - Seated Ankle Eversion with Anchored Resistance  - 1 x daily - 7 x weekly - 2 sets - 10 reps - Standing Romberg to 1/2 Tandem Stance  - 1 x daily - 7 x weekly - 3 sets - 10 reps - 20 hold - Backward Walking with Counter Support  - 1 x daily - 7 x weekly - 3 sets - 10 reps - Side Lunge with Counter Support  - 1 x daily - 7 x weekly - 3 sets - 10 reps   GOALS: Goals reviewed with patient? Yes  SHORT TERM GOALS: Target date: 02/22/2024  Patient will be independent in home exercise program to improve strength/mobility and balance  for better functional independence with ADLs.  Baseline: need to initiate Goal status: INITIAL   LONG TERM GOALS: Target date: 04/04/2024  Patient will increase Berg Balance score to > 51/56 to demonstrate improved balance and decreased fall risk during functional activities and ADLs.  Baseline: 34/56 Goal status: INITIAL  2.  Patient will increase Functional Gait Assessment (FGA) score to >20/30 as to reduce fall risk and improve dynamic gait safety with community ambulation.  Baseline: 5/30 due to pt frequently reaching for wall support from being unbalanced Goal status: INITIAL  3.  Patient will increase 10 meter walk test to >1.78m/s as to improve gait speed for better community ambulation and to reduce fall risk. Baseline: 0.764m/s without AD, CGA Goal status: INITIAL  4.  Patient will increase six minute walk test distance to >1000 for progression to community ambulator and improve gait ability  Baseline:  699 feet having to stop the test at 37min16sec due to fatigue (213 meters, Avg speed 0.32m/s) no AD, with CGA and pt frequently reaching for the wall to support his imbalance Goal status: INITIAL  5.  Patient will increase ABC scale score >80% to demonstrate better functional mobility and better confidence with ADLs.  Baseline: 56.25% Goal status: INITIAL   ASSESSMENT:  CLINICAL IMPRESSION:  Patient arrived with good motivation for completion of therapy interventions.  PT treatment continued to focus on improved ankle ROm and strength into PF. Noted to have improved PF activation with banded resistance and on leg press, but profound sensory loss limit ability to achieve full ROM in weighed position. Core stability training initiated as pt reports >10 hx of degenerative changes in low back. Will need to continue to assess need for medical intervention and benefits of continued PT following progress not.  Pt will continue to benefit from skilled physical therapy intervention to  address impairments, improve QOL, and attain therapy goals.    OBJECTIVE IMPAIRMENTS: Abnormal gait, decreased balance, decreased knowledge of condition, decreased knowledge of use of DME, decreased mobility, difficulty walking, decreased strength, impaired sensation, and improper body mechanics.   ACTIVITY LIMITATIONS: carrying, lifting, bending, standing, squatting, stairs, transfers, bathing, toileting, dressing, hygiene/grooming, and locomotion level  PARTICIPATION LIMITATIONS: meal prep, cleaning, laundry, shopping, community activity, occupation, yard work, and dancing  PERSONAL FACTORS: Age, Time since onset of  injury/illness/exacerbation, and 3+ comorbidities: HTN, thyroid  disease, and diabetes are also affecting patient's functional outcome.   REHAB POTENTIAL: Good  CLINICAL DECISION MAKING: Stable/uncomplicated  EVALUATION COMPLEXITY: Moderate  PLAN:  PT FREQUENCY: 1-2x/week  PT DURATION: 12 weeks  PLANNED INTERVENTIONS: 97164- PT Re-evaluation, 97750- Physical Performance Testing, 97110-Therapeutic exercises, 97530- Therapeutic activity, 97112- Neuromuscular re-education, 97535- Self Care, 62952- Manual therapy, [phone]- Gait training, [phone]- Canalith repositioning, [phone]- Electrical stimulation (manual), Patient/Family education, Balance training, Stair training, Joint mobilization, Vestibular training, DME instructions, Cryotherapy, Moist heat, and Biofeedback  PLAN FOR NEXT SESSION:   - eyes closed as appropriate, daily walking, etc. - standing balance focusing on foot flat as pt tends to have posterior lean/wt shift into heels as well as narrow stance - gait training with improved foot rocker, rolling through toes during terminal stance/pre-swing  Aurora Lees PT, DPT  Physical Therapist - [facility]  [facility]  2:11 PM 02/11/24

## 2024-02-16 ENCOUNTER — Ambulatory Visit: Attending: Neurology

## 2024-02-16 DIAGNOSIS — R269 Unspecified abnormalities of gait and mobility: Secondary | ICD-10-CM | POA: Diagnosis present

## 2024-02-16 DIAGNOSIS — R2681 Unsteadiness on feet: Secondary | ICD-10-CM | POA: Diagnosis present

## 2024-02-16 DIAGNOSIS — R531 Weakness: Secondary | ICD-10-CM | POA: Insufficient documentation

## 2024-02-16 DIAGNOSIS — R262 Difficulty in walking, not elsewhere classified: Secondary | ICD-10-CM | POA: Diagnosis present

## 2024-02-16 NOTE — Therapy (Signed)
 OUTPATIENT PHYSICAL THERAPY NEURO TREATMENT/PHYSICAL THERAPY PROGRESS NOTE   Dates of reporting period  01/11/24   to   02/16/24    Patient Name: Adam Castillo MRN: [id number] DOB:06/05/1946, 78 y.o., male Today's Date: 02/16/2024   PCP: Jacques Mattock, PA-C  REFERRING PROVIDER: Bufford Carne, MD   END OF SESSION:   PT End of Session - 02/16/24 1141     Visit Number 10    Number of Visits 24    Date for PT Re-Evaluation 04/04/24    PT Start Time 1145    PT Stop Time 1230    PT Time Calculation (min) 45 min    Equipment Utilized During Treatment Gait belt    Activity Tolerance Patient tolerated treatment well    Behavior During Therapy WFL for tasks assessed/performed               Past Medical History:  Diagnosis Date   Diabetes mellitus without complication (HCC)    Hypertension    Thyroid  disease    Past Surgical History:  Procedure Laterality Date   COLONOSCOPY WITH PROPOFOL      COLONOSCOPY WITH PROPOFOL  N/A 10/15/2021   Procedure: COLONOSCOPY WITH PROPOFOL ;  Surgeon: Luke Salaam, MD;  Location: [facility] ENDOSCOPY;  Service: Gastroenterology;  Laterality: N/A;   HERNIA REPAIR     INSERTION OF MESH  08/27/2021   Procedure: INSERTION OF MESH;  Surgeon: Alben Alma, MD;  Location: [facility] ORS;  Service: General;;   Patient Active Problem List   Diagnosis Date Noted   Hernia of scrotum 11/02/2020   Unilateral recurrent inguinal hernia without obstruction or gangrene 11/02/2020   Stage 3a chronic kidney disease (HCC) 08/14/2020   Annual physical exam 08/02/2020   Abrasion of right hand 08/02/2020   Primary osteoarthritis of right knee 06/12/2020   Hypothyroidism due to Hashimoto's thyroiditis 02/10/2020   Dyslipidemia 02/10/2020   Essential hypertension 02/07/2020    ONSET DATE: Reports steady decline over past 2 years  REFERRING DIAG:  G62.9 (ICD-10-CM) - Polyneuropathy  R26.9 (ICD-10-CM) - Gait abnormality  R26.89 (ICD-10-CM) - Balance disorder     THERAPY DIAG:  Difficulty in walking  Abnormality of gait  Unsteadiness on feet  Generalized weakness  Rationale for Evaluation and Treatment: Rehabilitation  SUBJECTIVE:                                                                                                                                                                                             SUBJECTIVE STATEMENT:  Pt reports he is doing well and is ready for his progress report.   From Initial Eval: Pt reports  steady decline in his balance over the past 2 years after he retired. 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 Pt denies falls, but states he "walks like a drunk man." Pt wearing nice dressier shoes today. Pt reports increased difficulty with any household tasks that requires him to stand, due to his balance, but primarily is disappointed he can no longer dance.   Reports it feels like he is "walking on sponges" from where his feet are "numb." Pt states standing in 1 place causes his feet to be more numb.  Pt accompanied by: self  PERTINENT HISTORY: Pt reports hx of hernial operation and prior R hand/wrist fx with no residual deficits, but otherwise healthy PMH: HTN, thyroid  disease, and diabetes  PAIN:  Are you having pain? No  PRECAUTIONS: Fall  RED FLAGS: None   WEIGHT BEARING RESTRICTIONS: No  FALLS: Has patient fallen in last 6 months? No and states he "walks like a drunk" and stumbles a lot  LIVING ENVIRONMENT: Lives with: lives alone Lives in: House/apartment Stairs: Yes: External: 3 steps; none or can enter through the garage with 1 step-up to enter Has following equipment at home: built in shower seat  PLOF: Independent with basic ADLs, Independent with homemaking with ambulation, and Independent with gait  PATIENT GOALS: prevent his balance from getting worse and ideally go back to work and return to dancing  OBJECTIVE:   Note: Objective measures were completed at Evaluation unless otherwise noted.  DIAGNOSTIC FINDINGS:  EMG Impression: Abnormal study. 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  Electronically signed by Roby Chris, MD at 07/24/2023 12:08 PM EST   COGNITION: Overall cognitive status: Within functional limits for tasks assessed   SENSATION: Light touch: Impaired bilaterally with R LE more impaired than L Proprioception: not formally assessed, but appears impaired during functional mobility  COORDINATION: WFL heel to shin  EDEMA:  None observed on eval  MUSCLE TONE: appears WNL  MUSCLE LENGTH: Not formally assessed  DTRs:  Patella: may benefit from testing Achilles: may benefit from testing  POSTURE: rounded shoulders, forward head, and posterior pelvic tilt  LOWER EXTREMITY ROM:     Active   WFL throughout B LEs Right Eval Left Eval  Hip flexion    Hip extension    Hip abduction    Hip adduction    Hip internal rotation    Hip external rotation    Knee flexion    Knee extension    Ankle dorsiflexion    Ankle plantarflexion    Ankle inversion    Ankle eversion     (Blank rows = not tested)  LOWER EXTREMITY MMT:    MMT Right Eval Left Eval  Hip flexion 5 5  Hip extension    Hip abduction    Hip adduction    Hip internal rotation    Hip external rotation    Knee flexion 5 5  Knee extension 5 5  Ankle dorsiflexion 4- 4-  Ankle plantarflexion 4- 4-  Ankle inversion    Ankle eversion    (Blank rows = not tested)  Manual Muscle Test Scale 0/5 = No muscle contraction can be seen or felt 1/5 = Contraction can be felt, but there is no motion 2-/5 = Part moves through incomplete ROM w/ gravity decreased 2/5 = Part moves through complete ROM w/ gravity decreased 2+/5 = Part moves through incomplete  ROM (<50%) against gravity or through complete ROM w/ gravity 3-/5 = Part  moves through incomplete ROM (>50%) against gravity 3/5 = Part moves through complete ROM against gravity 3+/5 = Part moves through complete ROM against gravity/slight resistance 4-/5= Holds test position against slight to moderate pressure 4/5 = Part moves through complete ROM against gravity/moderate resistance 4+/5= Holds test position against moderate to strong pressure 5/5 = Part moves through complete ROM against gravity/full resistance  BED MOBILITY:  Findings: not formally assessed, but not reports of difficulty  TRANSFERS: Sit to stand: CGA  Assistive device utilized: None     Stand to sit: CGA  Assistive device utilized: None     Chair to chair: CGA  Assistive device utilized: None       RAMP:  Not tested, but would benefit from testing  CURB:  Not tested, but would benefit from testing  STAIRS: Need to assess GAIT: Findings: Gait Characteristics: excessive posterior lean throughout with his weight primarily through his heels, step through pattern, decreased step length- Right, decreased step length- Left, decreased stride length, and narrow BOS, Distance walked: ~115ft,  Assistive device utilized:None Level of assistance: CGA Comments: excessive heel strike with lateral bias and pt doesn't transfer weight through midfoot nor forefoot during stance, excessive posterior pelvic tilt throughout with compensatory excessive thoracic rounding to prevent posterior LOB  FUNCTIONAL TESTS:  6 minute walk test: need to assess 10 meter walk test: 0.737m/s Berg Balance Scale: 34/56 Functional gait assessment: need to assess  PATIENT SURVEYS:  ABC scale 56.25% with pt reporting primarily 50% confident with majority of the items including walking around the house - pt reporting he is unsteady with all standing tasks                                                                                                                              TREATMENT DATE: 02/16/2024  Goal assessment  performed and noted below:  TA:  BERG: 40/56 FGA: 20/30 : 0.923 m/s : 70' with no AD ABC Scale: 70.63%   PATIENT EDUCATION: Education details: Therapy POC, findings on evaluation today, performance of daily foot assessments, balance systems Person educated: Patient Education method: Explanation Education comprehension: verbalized understanding and needs further education  HOME EXERCISE PROGRAM:  Access Code: [email or web address] URL: [email or web address] Date: 02/10/2024 Prepared by: Aurora Lees  Exercises - Wide Stance with Counter Support  - 1 x daily - 7 x weekly - 3 sets - 30 seconds hold - Sit to Stand with Counter Support  - 1 x daily - 7 x weekly - 2 sets - 10 reps - Seated Ankle Plantar Flexion with Resistance Loop  - 1 x daily - 7 x weekly - 2 sets - 10 reps - Seated Ankle Eversion with Anchored Resistance  - 1 x daily - 7 x weekly - 2 sets - 10 reps - Standing Romberg to 1/2 Tandem Stance  - 1 x daily - 7  x weekly - 3 sets - 10 reps - 20 hold - Backward Walking with Counter Support  - 1 x daily - 7 x weekly - 3 sets - 10 reps - Side Lunge with Counter Support  - 1 x daily - 7 x weekly - 3 sets - 10 reps   GOALS: Goals reviewed with patient? Yes  SHORT TERM GOALS: Target date: 02/22/2024  Patient will be independent in home exercise program to improve strength/mobility and balance for better functional independence with ADLs.  Baseline: need to initiate Goal status: INITIAL   LONG TERM GOALS: Target date: 04/04/2024  Patient will increase Berg Balance score to > 51/56 to demonstrate improved balance and decreased fall risk during functional activities and ADLs.  Baseline: 34/56 02/16/24: 40/56 Goal status: PROGRESSING  2.  Patient will increase Functional Gait Assessment (FGA) score to >20/30 as to reduce fall risk and improve dynamic gait safety with community ambulation.  Baseline: 5/30 due to pt frequently reaching for wall support from being  unbalanced 02/16/24: 20/30 Goal status: PROGRESSING  3.  Patient will increase 10 meter walk test to >1.89m/s as to improve gait speed for better community ambulation and to reduce fall risk. Baseline: 0.736m/s without AD, CGA 02/16/24: 0.923 m/s without AD, CGA Goal status: PROGRESSING  4.  Patient will increase six minute walk test distance to >1000 for progression to community ambulator and improve gait ability  Baseline:  699 feet having to stop the test at 16min16sec due to fatigue (213 meters, Avg speed 0.61m/s) no AD, with CGA and pt frequently reaching for the wall to support his imbalance 02/16/24: 740' with no AD Goal status: PROGRESSING  5.  Patient will increase ABC scale score >80% to demonstrate better functional mobility and better confidence with ADLs.  Baseline: 56.25% 02/16/24: 70.63% Goal status: PROGRESSING   ASSESSMENT:  CLINICAL IMPRESSION:   Pt seen for 10th visit progress not and has made significant progress towards 5 total goals at this time.  Pt has not been able to achieve the goals in the 10 visits, however is approaching the end of several of the listed goals.  Pt is much more stable when ambulating and performing exercises.  Patient's condition has the potential to improve in response to therapy. Maximum improvement is yet to be obtained. The anticipated improvement is attainable and reasonable in a generally predictable time.   Pt will continue to benefit from skilled therapy to address remaining deficits in order to improve overall QoL and return to PLOF.      OBJECTIVE IMPAIRMENTS: Abnormal gait, decreased balance, decreased knowledge of condition, decreased knowledge of use of DME, decreased mobility, difficulty walking, decreased strength, impaired sensation, and improper body mechanics.   ACTIVITY LIMITATIONS: carrying, lifting, bending, standing, squatting, stairs, transfers, bathing, toileting, dressing, hygiene/grooming, and locomotion  level  PARTICIPATION LIMITATIONS: meal prep, cleaning, laundry, shopping, community activity, occupation, yard work, and dancing  PERSONAL FACTORS: Age, Time since onset of injury/illness/exacerbation, and 3+ comorbidities: HTN, thyroid  disease, and diabetes are also affecting patient's functional outcome.   REHAB POTENTIAL: Good  CLINICAL DECISION MAKING: Stable/uncomplicated  EVALUATION COMPLEXITY: Moderate  PLAN:  PT FREQUENCY: 1-2x/week  PT DURATION: 12 weeks  PLANNED INTERVENTIONS: 97164- PT Re-evaluation, 97750- Physical Performance Testing, 97110-Therapeutic exercises, 97530- Therapeutic activity, [id number]- Neuromuscular re-education, 97535- Self Care, [address]- Manual therapy, [id number]- Gait training, [phone]- Canalith repositioning, [phone]- Electrical stimulation (manual), Patient/Family education, Balance training, Stair training, Joint mobilization, Vestibular training, DME instructions, Cryotherapy, Moist heat, and Biofeedback  PLAN FOR NEXT SESSION:   - eyes closed as appropriate, daily walking, etc. - standing balance focusing on foot flat as pt tends to have posterior lean/wt shift into heels as well as narrow stance - gait training with improved foot rocker, rolling through toes during terminal stance/pre-swing   Rozanna Corner, PT, DPT Physical Therapist - [facility]  02/16/24, 11:42 AM

## 2024-02-17 NOTE — Procedures (Signed)
 [facility] [address] [place], [address]    Complete Pulmonary Function Testing Interpretation:  FINDINGS:  The forced vital capacity is mildly decreased.  FEV1 is 2.22 L which is 77% of predicted and is mildly reduced.  Postbronchodilator no significant improvement in FEV1.  Total lung capacity is mildly decreased.  Residual volume is decreased residual volume total capacity ratio was decreased.  FRC was decreased.  The DLCO was increased.  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  Cordie Deters, MD [facility] Pulmonary Critical Care Medicine Sleep Medicine

## 2024-02-18 ENCOUNTER — Ambulatory Visit: Admitting: Physical Therapy

## 2024-02-18 DIAGNOSIS — R269 Unspecified abnormalities of gait and mobility: Secondary | ICD-10-CM

## 2024-02-18 DIAGNOSIS — R262 Difficulty in walking, not elsewhere classified: Secondary | ICD-10-CM

## 2024-02-18 DIAGNOSIS — R531 Weakness: Secondary | ICD-10-CM

## 2024-02-18 DIAGNOSIS — R2681 Unsteadiness on feet: Secondary | ICD-10-CM

## 2024-02-18 NOTE — Therapy (Unsigned)
 OUTPATIENT PHYSICAL THERAPY NEURO TREATMENT/PHYSICAL THERAPY PROGRESS NOTE   Dates of reporting period  01/11/24   to   02/16/24    Patient Name: Adam Castillo MRN: [id number] DOB:30-Mar-1946, 78 y.o., male Today's Date: 02/18/2024   PCP: Jacques Mattock, PA-C  REFERRING PROVIDER: Bufford Carne, MD   END OF SESSION:   PT End of Session - 02/18/24 1446     Visit Number 11    Number of Visits 24    Date for PT Re-Evaluation 04/04/24    PT Start Time 1447    PT Stop Time 1530    PT Time Calculation (min) 43 min    Equipment Utilized During Treatment Gait belt    Activity Tolerance Patient tolerated treatment well    Behavior During Therapy WFL for tasks assessed/performed               Past Medical History:  Diagnosis Date   Diabetes mellitus without complication (HCC)    Hypertension    Thyroid  disease    Past Surgical History:  Procedure Laterality Date   COLONOSCOPY WITH PROPOFOL      COLONOSCOPY WITH PROPOFOL  N/A 10/15/2021   Procedure: COLONOSCOPY WITH PROPOFOL ;  Surgeon: Luke Salaam, MD;  Location: [facility] ENDOSCOPY;  Service: Gastroenterology;  Laterality: N/A;   HERNIA REPAIR     INSERTION OF MESH  08/27/2021   Procedure: INSERTION OF MESH;  Surgeon: Alben Alma, MD;  Location: [facility] ORS;  Service: General;;   Patient Active Problem List   Diagnosis Date Noted   Hernia of scrotum 11/02/2020   Unilateral recurrent inguinal hernia without obstruction or gangrene 11/02/2020   Stage 3a chronic kidney disease (HCC) 08/14/2020   Annual physical exam 08/02/2020   Abrasion of right hand 08/02/2020   Primary osteoarthritis of right knee 06/12/2020   Hypothyroidism due to Hashimoto's thyroiditis 02/10/2020   Dyslipidemia 02/10/2020   Essential hypertension 02/07/2020    ONSET DATE: Reports steady decline over past 2 years  REFERRING DIAG:  G62.9 (ICD-10-CM) - Polyneuropathy  R26.9 (ICD-10-CM) - Gait abnormality  R26.89 (ICD-10-CM) - Balance disorder     THERAPY DIAG:  Difficulty in walking  Abnormality of gait  Unsteadiness on feet  Generalized weakness  Rationale for Evaluation and Treatment: Rehabilitation  SUBJECTIVE:                                                                                                                                                                                             SUBJECTIVE STATEMENT:  Pt reports he is doing well  no pain reported by Pt.   From Initial Eval: Pt reports steady  decline in his balance over the past 2 years after he retired. 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 Pt denies falls, but states he "walks like a drunk man." Pt wearing nice dressier shoes today. Pt reports increased difficulty with any household tasks that requires him to stand, due to his balance, but primarily is disappointed he can no longer dance.   Reports it feels like he is "walking on sponges" from where his feet are "numb." Pt states standing in 1 place causes his feet to be more numb.  Pt accompanied by: self  PERTINENT HISTORY: Pt reports hx of hernial operation and prior R hand/wrist fx with no residual deficits, but otherwise healthy PMH: HTN, thyroid  disease, and diabetes  PAIN:  Are you having pain? No  PRECAUTIONS: Fall  RED FLAGS: None   WEIGHT BEARING RESTRICTIONS: No  FALLS: Has patient fallen in last 6 months? No and states he "walks like a drunk" and stumbles a lot  LIVING ENVIRONMENT: Lives with: lives alone Lives in: House/apartment Stairs: Yes: External: 3 steps; none or can enter through the garage with 1 step-up to enter Has following equipment at home: built in shower seat  PLOF: Independent with basic ADLs, Independent with homemaking with ambulation, and Independent with gait  PATIENT GOALS: prevent his balance from getting worse and ideally go back to work and return to dancing  OBJECTIVE:  Note: Objective  measures were completed at Evaluation unless otherwise noted.  DIAGNOSTIC FINDINGS:  EMG Impression: Abnormal study. 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  Electronically signed by Roby Chris, MD at 07/24/2023 12:08 PM EST   COGNITION: Overall cognitive status: Within functional limits for tasks assessed   SENSATION: Light touch: Impaired bilaterally with R LE more impaired than L Proprioception: not formally assessed, but appears impaired during functional mobility  COORDINATION: WFL heel to shin  EDEMA:  None observed on eval  MUSCLE TONE: appears WNL  MUSCLE LENGTH: Not formally assessed  DTRs:  Patella: may benefit from testing Achilles: may benefit from testing  POSTURE: rounded shoulders, forward head, and posterior pelvic tilt  LOWER EXTREMITY ROM:     Active   WFL throughout B LEs Right Eval Left Eval  Hip flexion    Hip extension    Hip abduction    Hip adduction    Hip internal rotation    Hip external rotation    Knee flexion    Knee extension    Ankle dorsiflexion    Ankle plantarflexion    Ankle inversion    Ankle eversion     (Blank rows = not tested)  LOWER EXTREMITY MMT:    MMT Right Eval Left Eval  Hip flexion 5 5  Hip extension    Hip abduction    Hip adduction    Hip internal rotation    Hip external rotation    Knee flexion 5 5  Knee extension 5 5  Ankle dorsiflexion 4- 4-  Ankle plantarflexion 4- 4-  Ankle inversion    Ankle eversion    (Blank rows = not tested)  Manual Muscle Test Scale 0/5 = No muscle contraction can be seen or felt 1/5 = Contraction can be felt, but there is no motion 2-/5 = Part moves through incomplete ROM w/ gravity decreased 2/5 = Part moves through complete ROM w/ gravity decreased 2+/5 = Part moves through incomplete ROM (<  50%) against gravity or through complete ROM w/ gravity 3-/5 = Part moves through  incomplete ROM (>50%) against gravity 3/5 = Part moves through complete ROM against gravity 3+/5 = Part moves through complete ROM against gravity/slight resistance 4-/5= Holds test position against slight to moderate pressure 4/5 = Part moves through complete ROM against gravity/moderate resistance 4+/5= Holds test position against moderate to strong pressure 5/5 = Part moves through complete ROM against gravity/full resistance  BED MOBILITY:  Findings: not formally assessed, but not reports of difficulty  TRANSFERS: Sit to stand: CGA  Assistive device utilized: None     Stand to sit: CGA  Assistive device utilized: None     Chair to chair: CGA  Assistive device utilized: None       RAMP:  Not tested, but would benefit from testing  CURB:  Not tested, but would benefit from testing  STAIRS: Need to assess GAIT: Findings: Gait Characteristics: excessive posterior lean throughout with his weight primarily through his heels, step through pattern, decreased step length- Right, decreased step length- Left, decreased stride length, and narrow BOS, Distance walked: ~130ft,  Assistive device utilized:None Level of assistance: CGA Comments: excessive heel strike with lateral bias and pt doesn't transfer weight through midfoot nor forefoot during stance, excessive posterior pelvic tilt throughout with compensatory excessive thoracic rounding to prevent posterior LOB  FUNCTIONAL TESTS:  6 minute walk test: need to assess 10 meter walk test: 0.783m/s Berg Balance Scale: 34/56 Functional gait assessment: need to assess  PATIENT SURVEYS:  ABC scale 56.25% with pt reporting primarily 50% confident with majority of the items including walking around the house - pt reporting he is unsteady with all standing tasks                                                                                                                              TREATMENT DATE: 02/18/2024  Seated ankle DF/PF x 25   Seated heel raise AROM x 20  Ankle eversion x 15 bil  Seated heel raise 2x 10 with 5# AW on knees  Seated rocker board AP x 15   Standing on rocker board with UE support on rail.  AP rocks x 15  Static AP hold x 45 sec, assist for improved WB through  forefoot.  Lateral rocks .   PATIENT EDUCATION: Education details: Therapy POC, findings on evaluation today, performance of daily foot assessments, balance systems Person educated: Patient Education method: Explanation Education comprehension: verbalized understanding and needs further education  HOME EXERCISE PROGRAM:  Access Code: [email or web address] URL: [email or web address] Date: 02/10/2024 Prepared by: Aurora Lees  Exercises - Wide Stance with Counter Support  - 1 x daily - 7 x weekly - 3 sets - 30 seconds hold - Sit to Stand with Counter Support  - 1 x daily - 7 x weekly - 2 sets - 10 reps - Seated Ankle Plantar Flexion with Resistance Loop  - 1 x daily -  7 x weekly - 2 sets - 10 reps - Seated Ankle Eversion with Anchored Resistance  - 1 x daily - 7 x weekly - 2 sets - 10 reps - Standing Romberg to 1/2 Tandem Stance  - 1 x daily - 7 x weekly - 3 sets - 10 reps - 20 hold - Backward Walking with Counter Support  - 1 x daily - 7 x weekly - 3 sets - 10 reps - Side Lunge with Counter Support  - 1 x daily - 7 x weekly - 3 sets - 10 reps   GOALS: Goals reviewed with patient? Yes  SHORT TERM GOALS: Target date: 02/22/2024  Patient will be independent in home exercise program to improve strength/mobility and balance for better functional independence with ADLs.  Baseline: need to initiate Goal status: INITIAL   LONG TERM GOALS: Target date: 04/04/2024  Patient will increase Berg Balance score to > 51/56 to demonstrate improved balance and decreased fall risk during functional activities and ADLs.  Baseline: 34/56 02/16/24: 40/56 Goal status: PROGRESSING  2.  Patient will increase Functional Gait Assessment (FGA) score  to >20/30 as to reduce fall risk and improve dynamic gait safety with community ambulation.  Baseline: 5/30 due to pt frequently reaching for wall support from being unbalanced 02/16/24: 20/30 Goal status: PROGRESSING  3.  Patient will increase 10 meter walk test to >1.80m/s as to improve gait speed for better community ambulation and to reduce fall risk. Baseline: 0.754m/s without AD, CGA 02/16/24: 0.923 m/s without AD, CGA Goal status: PROGRESSING  4.  Patient will increase six minute walk test distance to >1000 for progression to community ambulator and improve gait ability  Baseline:  699 feet having to stop the test at 77min16sec due to fatigue (213 meters, Avg speed 0.18m/s) no AD, with CGA and pt frequently reaching for the wall to support his imbalance 02/16/24: 740' with no AD Goal status: PROGRESSING  5.  Patient will increase ABC scale score >80% to demonstrate better functional mobility and better confidence with ADLs.  Baseline: 56.25% 02/16/24: 70.63% Goal status: PROGRESSING   ASSESSMENT:  CLINICAL IMPRESSION:   Pt seen for 10th visit progress not and has made significant progress towards 5 total goals at this time.  Pt has not been able to achieve the goals in the 10 visits, however is approaching the end of several of the listed goals.  Pt is much more stable when ambulating and performing exercises.  Patient's condition has the potential to improve in response to therapy. Maximum improvement is yet to be obtained. The anticipated improvement is attainable and reasonable in a generally predictable time.   Pt will continue to benefit from skilled therapy to address remaining deficits in order to improve overall QoL and return to PLOF.      OBJECTIVE IMPAIRMENTS: Abnormal gait, decreased balance, decreased knowledge of condition, decreased knowledge of use of DME, decreased mobility, difficulty walking, decreased strength, impaired sensation, and improper body mechanics.    ACTIVITY LIMITATIONS: carrying, lifting, bending, standing, squatting, stairs, transfers, bathing, toileting, dressing, hygiene/grooming, and locomotion level  PARTICIPATION LIMITATIONS: meal prep, cleaning, laundry, shopping, community activity, occupation, yard work, and dancing  PERSONAL FACTORS: Age, Time since onset of injury/illness/exacerbation, and 3+ comorbidities: HTN, thyroid  disease, and diabetes are also affecting patient's functional outcome.   REHAB POTENTIAL: Good  CLINICAL DECISION MAKING: Stable/uncomplicated  EVALUATION COMPLEXITY: Moderate  PLAN:  PT FREQUENCY: 1-2x/week  PT DURATION: 12 weeks  PLANNED INTERVENTIONS: 97164- PT Re-evaluation, 97750- Physical  Performance Testing, 97110-Therapeutic exercises, 97530- Therapeutic activity, [id number]- Neuromuscular re-education, [phone]- Self Care, [id number]- Manual therapy, [phone]- Gait training, [phone]- Canalith repositioning, [phone]- Electrical stimulation (manual), Patient/Family education, Balance training, Stair training, Joint mobilization, Vestibular training, DME instructions, Cryotherapy, Moist heat, and Biofeedback   PLAN FOR NEXT SESSION:   - eyes closed as appropriate, daily walking, etc. - standing balance focusing on foot flat as pt tends to have posterior lean/wt shift into heels as well as narrow stance - gait training with improved foot rocker, rolling through toes during terminal stance/pre-swing   Aurora Lees PT, DPT  Physical Therapist - [facility]  [facility]  2:50 PM 02/18/24

## 2024-02-23 ENCOUNTER — Ambulatory Visit: Admitting: Physical Therapy

## 2024-02-23 DIAGNOSIS — R2681 Unsteadiness on feet: Secondary | ICD-10-CM

## 2024-02-23 DIAGNOSIS — R262 Difficulty in walking, not elsewhere classified: Secondary | ICD-10-CM | POA: Diagnosis not present

## 2024-02-23 DIAGNOSIS — R269 Unspecified abnormalities of gait and mobility: Secondary | ICD-10-CM

## 2024-02-23 DIAGNOSIS — R531 Weakness: Secondary | ICD-10-CM

## 2024-02-23 NOTE — Therapy (Signed)
 OUTPATIENT PHYSICAL THERAPY NEURO TREATMENT  Patient Name: Adam Castillo MRN: [id number] DOB:09-22-1945, 78 y.o., male Today's Date: 02/23/2024   PCP: Jacques Mattock, PA-C  REFERRING PROVIDER: Bufford Carne, MD   END OF SESSION:   PT End of Session - 02/23/24 1156     Visit Number 12    Number of Visits 24    Date for PT Re-Evaluation 04/04/24    PT Start Time 1150    PT Stop Time 1230    PT Time Calculation (min) 40 min    Equipment Utilized During Treatment Gait belt    Activity Tolerance Patient tolerated treatment well    Behavior During Therapy WFL for tasks assessed/performed               Past Medical History:  Diagnosis Date   Diabetes mellitus without complication (HCC)    Hypertension    Thyroid  disease    Past Surgical History:  Procedure Laterality Date   COLONOSCOPY WITH PROPOFOL      COLONOSCOPY WITH PROPOFOL  N/A 10/15/2021   Procedure: COLONOSCOPY WITH PROPOFOL ;  Surgeon: Luke Salaam, MD;  Location: [facility] ENDOSCOPY;  Service: Gastroenterology;  Laterality: N/A;   HERNIA REPAIR     INSERTION OF MESH  08/27/2021   Procedure: INSERTION OF MESH;  Surgeon: Alben Alma, MD;  Location: [facility] ORS;  Service: General;;   Patient Active Problem List   Diagnosis Date Noted   Hernia of scrotum 11/02/2020   Unilateral recurrent inguinal hernia without obstruction or gangrene 11/02/2020   Stage 3a chronic kidney disease (HCC) 08/14/2020   Annual physical exam 08/02/2020   Abrasion of right hand 08/02/2020   Primary osteoarthritis of right knee 06/12/2020   Hypothyroidism due to Hashimoto's thyroiditis 02/10/2020   Dyslipidemia 02/10/2020   Essential hypertension 02/07/2020    ONSET DATE: Reports steady decline over past 2 years  REFERRING DIAG:  G62.9 (ICD-10-CM) - Polyneuropathy  R26.9 (ICD-10-CM) - Gait abnormality  R26.89 (ICD-10-CM) - Balance disorder    THERAPY DIAG:  Difficulty in walking  Abnormality of gait  Unsteadiness on  feet  Generalized weakness  Rationale for Evaluation and Treatment: Rehabilitation  SUBJECTIVE:                                                                                                                                                                                             SUBJECTIVE STATEMENT:  Pt reports he is doing well  no pain reported by Pt.   From Initial Eval: Pt reports steady decline in his balance over the past 2 years after he retired. 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 Pt denies falls, but states he "walks like a drunk man." Pt wearing nice dressier shoes today. Pt reports increased difficulty with any household tasks that requires him to stand, due to his balance, but primarily is disappointed he can no longer dance.   Reports it feels like he is "walking on sponges" from where his feet are "numb." Pt states standing in 1 place causes his feet to be more numb.  Pt accompanied by: self  PERTINENT HISTORY: Pt reports hx of hernial operation and prior R hand/wrist fx with no residual deficits, but otherwise healthy PMH: HTN, thyroid  disease, and diabetes  PAIN:  Are you having pain? No  PRECAUTIONS: Fall  RED FLAGS: None   WEIGHT BEARING RESTRICTIONS: No  FALLS: Has patient fallen in last 6 months? No and states he "walks like a drunk" and stumbles a lot  LIVING ENVIRONMENT: Lives with: lives alone Lives in: House/apartment Stairs: Yes: External: 3 steps; none or can enter through the garage with 1 step-up to enter Has following equipment at home: built in shower seat  PLOF: Independent with basic ADLs, Independent with homemaking with ambulation, and Independent with gait  PATIENT GOALS: prevent his balance from getting worse and ideally go back to work and return to dancing  OBJECTIVE:  Note: Objective measures were completed at Evaluation unless otherwise noted.  DIAGNOSTIC FINDINGS:   EMG Impression: Abnormal study. 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  Electronically signed by Roby Chris, MD at 07/24/2023 12:08 PM EST   COGNITION: Overall cognitive status: Within functional limits for tasks assessed   SENSATION: Light touch: Impaired bilaterally with R LE more impaired than L Proprioception: not formally assessed, but appears impaired during functional mobility  COORDINATION: WFL heel to shin  EDEMA:  None observed on eval  MUSCLE TONE: appears WNL  MUSCLE LENGTH: Not formally assessed  DTRs:  Patella: may benefit from testing Achilles: may benefit from testing  POSTURE: rounded shoulders, forward head, and posterior pelvic tilt  LOWER EXTREMITY ROM:     Active   WFL throughout B LEs Right Eval Left Eval  Hip flexion    Hip extension    Hip abduction    Hip adduction    Hip internal rotation    Hip external rotation    Knee flexion    Knee extension    Ankle dorsiflexion    Ankle plantarflexion    Ankle inversion    Ankle eversion     (Blank rows = not tested)  LOWER EXTREMITY MMT:    MMT Right Eval Left Eval  Hip flexion 5 5  Hip extension    Hip abduction    Hip adduction    Hip internal rotation    Hip external rotation    Knee flexion 5 5  Knee extension 5 5  Ankle dorsiflexion 4- 4-  Ankle plantarflexion 4- 4-  Ankle inversion    Ankle eversion    (Blank rows = not tested)  Manual Muscle Test Scale 0/5 = No muscle contraction can be seen or felt 1/5 = Contraction can be felt, but there is no motion 2-/5 = Part moves through incomplete ROM w/ gravity decreased 2/5 = Part moves through complete ROM w/ gravity decreased 2+/5 = Part moves through incomplete ROM (<50%) against gravity or through complete ROM w/ gravity 3-/5 = Part moves through incomplete ROM (>50%) against gravity  3/5 = Part moves through complete ROM against  gravity 3+/5 = Part moves through complete ROM against gravity/slight resistance 4-/5= Holds test position against slight to moderate pressure 4/5 = Part moves through complete ROM against gravity/moderate resistance 4+/5= Holds test position against moderate to strong pressure 5/5 = Part moves through complete ROM against gravity/full resistance  BED MOBILITY:  Findings: not formally assessed, but not reports of difficulty  TRANSFERS: Sit to stand: CGA  Assistive device utilized: None     Stand to sit: CGA  Assistive device utilized: None     Chair to chair: CGA  Assistive device utilized: None       RAMP:  Not tested, but would benefit from testing  CURB:  Not tested, but would benefit from testing  STAIRS: Need to assess GAIT: Findings: Gait Characteristics: excessive posterior lean throughout with his weight primarily through his heels, step through pattern, decreased step length- Right, decreased step length- Left, decreased stride length, and narrow BOS, Distance walked: ~151ft,  Assistive device utilized:None Level of assistance: CGA Comments: excessive heel strike with lateral bias and pt doesn't transfer weight through midfoot nor forefoot during stance, excessive posterior pelvic tilt throughout with compensatory excessive thoracic rounding to prevent posterior LOB  FUNCTIONAL TESTS:  6 minute walk test: need to assess 10 meter walk test: 0.713m/s Berg Balance Scale: 34/56 Functional gait assessment: need to assess  PATIENT SURVEYS:  ABC scale 56.25% with pt reporting primarily 50% confident with majority of the items including walking around the house - pt reporting he is unsteady with all standing tasks                                                                                                                              TREATMENT DATE: 02/23/2024 PT applied gait belt and provided CGA throughout session for improved safety and reduced fall rise nless otherwise  noted.  Nustep BUE/BLE x 7 min total level 1-5 with varied resistance. Cues to keep pressure through forefoot throughout endurance training.   Modified weighted gait training with 4WW to push 30# kettle bells and encourage improved toe off and hip extension x 556ft  Forward/reverse gait training with weighted 4WW 30#; was abel to advance BLE beyond BOS with UE supported on AD. Performed 2 x 4 reps Forward/reverse gait with no AD 5 x 13ft  Reciprocal stepping over cane in floor 2x 12 bil with min assist for safety with fatigue.  Lateral toe tap with UE support on rails x 10 bil. Able to bear light weight throgh bil toes/forefoot, but states that he cannot feel it.     PATIENT EDUCATION: Education details: Therapy POC, findings on evaluation today, performance of daily foot assessments, balance systems Person educated: Patient Education method: Explanation Education comprehension: verbalized understanding and needs further education  HOME EXERCISE PROGRAM:  Access Code: [email or web address] URL: [email or web address] Date: 02/10/2024 Prepared by: Aurora Lees  Exercises - Wide Stance with Counter Support  -  1 x daily - 7 x weekly - 3 sets - 30 seconds hold - Sit to Stand with Counter Support  - 1 x daily - 7 x weekly - 2 sets - 10 reps - Seated Ankle Plantar Flexion with Resistance Loop  - 1 x daily - 7 x weekly - 2 sets - 10 reps - Seated Ankle Eversion with Anchored Resistance  - 1 x daily - 7 x weekly - 2 sets - 10 reps - Standing Romberg to 1/2 Tandem Stance  - 1 x daily - 7 x weekly - 3 sets - 10 reps - 20 hold - Backward Walking with Counter Support  - 1 x daily - 7 x weekly - 3 sets - 10 reps - Side Lunge with Counter Support  - 1 x daily - 7 x weekly - 3 sets - 10 reps   GOALS: Goals reviewed with patient? Yes  SHORT TERM GOALS: Target date: 02/22/2024  Patient will be independent in home exercise program to improve strength/mobility and balance for better functional  independence with ADLs.  Baseline: need to initiate Goal status: INITIAL   LONG TERM GOALS: Target date: 04/04/2024  Patient will increase Berg Balance score to > 51/56 to demonstrate improved balance and decreased fall risk during functional activities and ADLs.  Baseline: 34/56 02/16/24: 40/56 Goal status: PROGRESSING  2.  Patient will increase Functional Gait Assessment (FGA) score to >20/30 as to reduce fall risk and improve dynamic gait safety with community ambulation.  Baseline: 5/30 due to pt frequently reaching for wall support from being unbalanced 02/16/24: 20/30 Goal status: PROGRESSING  3.  Patient will increase 10 meter walk test to >1.50m/s as to improve gait speed for better community ambulation and to reduce fall risk. Baseline: 0.770m/s without AD, CGA 02/16/24: 0.923 m/s without AD, CGA Goal status: PROGRESSING  4.  Patient will increase six minute walk test distance to >1000 for progression to community ambulator and improve gait ability  Baseline:  699 feet having to stop the test at 71min16sec due to fatigue (213 meters, Avg speed 0.32m/s) no AD, with CGA and pt frequently reaching for the wall to support his imbalance 02/16/24: 740' with no AD Goal status: PROGRESSING  5.  Patient will increase ABC scale score >80% to demonstrate better functional mobility and better confidence with ADLs.  Baseline: 56.25% 02/16/24: 70.63% Goal status: PROGRESSING   ASSESSMENT:  CLINICAL IMPRESSION:   Pt reports to PT motivated to participate. Pt demonstrates mild improvement in toe off in BLE, L>R. Continuing to challenge dynamic balance and force increased step length to improve gluteal and gastroc activation with functional movements. Improve function is also noted with increased resistance and distances performed with interventions.  Pt will continue to benefit from skilled therapy to address remaining deficits in order to improve overall QoL and return to PLOF.      OBJECTIVE  IMPAIRMENTS: Abnormal gait, decreased balance, decreased knowledge of condition, decreased knowledge of use of DME, decreased mobility, difficulty walking, decreased strength, impaired sensation, and improper body mechanics.   ACTIVITY LIMITATIONS: carrying, lifting, bending, standing, squatting, stairs, transfers, bathing, toileting, dressing, hygiene/grooming, and locomotion level  PARTICIPATION LIMITATIONS: meal prep, cleaning, laundry, shopping, community activity, occupation, yard work, and dancing  PERSONAL FACTORS: Age, Time since onset of injury/illness/exacerbation, and 3+ comorbidities: HTN, thyroid  disease, and diabetes are also affecting patient's functional outcome.   REHAB POTENTIAL: Good  CLINICAL DECISION MAKING: Stable/uncomplicated  EVALUATION COMPLEXITY: Moderate  PLAN:  PT FREQUENCY: 1-2x/week  PT DURATION: 12 weeks  PLANNED INTERVENTIONS: 97164- PT Re-evaluation, 97750- Physical Performance Testing, 97110-Therapeutic exercises, 97530- Therapeutic activity, [id number]- Neuromuscular re-education, 97535- Self Care, [id number]- Manual therapy, [phone]- Gait training, [phone]- Canalith repositioning, [phone]- Electrical stimulation (manual), Patient/Family education, Balance training, Stair training, Joint mobilization, Vestibular training, DME instructions, Cryotherapy, Moist heat, and Biofeedback   PLAN FOR NEXT SESSION:   - eyes closed as appropriate, daily walking, etc. - standing balance focusing on foot flat as pt tends to have posterior lean/wt shift into heels as well as narrow stance - gait training with improved foot rocker, rolling through toes during terminal stance/pre-swing -continue expanding ankle strengthening.   Aurora Lees PT, DPT  Physical Therapist - [facility]  11:57 AM 02/23/24

## 2024-02-25 ENCOUNTER — Ambulatory Visit: Admitting: Physical Therapy

## 2024-02-25 DIAGNOSIS — R269 Unspecified abnormalities of gait and mobility: Secondary | ICD-10-CM

## 2024-02-25 DIAGNOSIS — R531 Weakness: Secondary | ICD-10-CM

## 2024-02-25 DIAGNOSIS — R2681 Unsteadiness on feet: Secondary | ICD-10-CM

## 2024-02-25 DIAGNOSIS — R262 Difficulty in walking, not elsewhere classified: Secondary | ICD-10-CM

## 2024-02-25 NOTE — Therapy (Signed)
 OUTPATIENT PHYSICAL THERAPY NEURO TREATMENT  Patient Name: Adam Castillo MRN: [id number] DOB:1946-06-01, 78 y.o., male Today's Date: 02/25/2024   PCP: Jacques Mattock, PA-C  REFERRING PROVIDER: Bufford Carne, MD   END OF SESSION:   PT End of Session - 02/25/24 1448     Visit Number 13    Number of Visits 24    Date for PT Re-Evaluation 04/04/24    PT Start Time 1449    PT Stop Time 1530    PT Time Calculation (min) 41 min    Equipment Utilized During Treatment Gait belt    Activity Tolerance Patient tolerated treatment well    Behavior During Therapy WFL for tasks assessed/performed            Past Medical History:  Diagnosis Date   Diabetes mellitus without complication (HCC)    Hypertension    Thyroid  disease    Past Surgical History:  Procedure Laterality Date   COLONOSCOPY WITH PROPOFOL      COLONOSCOPY WITH PROPOFOL  N/A 10/15/2021   Procedure: COLONOSCOPY WITH PROPOFOL ;  Surgeon: Luke Salaam, MD;  Location[facility] ENDOSCOPY;  Service: Gastroenterology;  Laterality: N/A;   HERNIA REPAIR     INSERTION OF MESH  08/27/2021   Procedure: INSERTION OF MESH;  Surgeon: Alben Alma, MD;  Location: [facility] ORS;  Service: General;;   Patient Active Problem List   Diagnosis Date Noted   Hernia of scrotum 11/02/2020   Unilateral recurrent inguinal hernia without obstruction or gangrene 11/02/2020   Stage 3a chronic kidney disease (HCC) 08/14/2020   Annual physical exam 08/02/2020   Abrasion of right hand 08/02/2020   Primary osteoarthritis of right knee 06/12/2020   Hypothyroidism due to Hashimoto's thyroiditis 02/10/2020   Dyslipidemia 02/10/2020   Essential hypertension 02/07/2020    ONSET DATE: Reports steady decline over past 2 years  REFERRING DIAG:  G62.9 (ICD-10-CM) - Polyneuropathy  R26.9 (ICD-10-CM) - Gait abnormality  R26.89 (ICD-10-CM) - Balance disorder    THERAPY DIAG:  Difficulty in walking  Abnormality of gait  Unsteadiness on  feet  Generalized weakness  Rationale for Evaluation and Treatment: Rehabilitation  SUBJECTIVE:                                                                                                                                                                                             SUBJECTIVE STATEMENT:  Pt reports he is doing well  no pain reported by Pt.   From Initial Eval: Pt reports steady decline in his balance over the past 2 years after he retired. 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 Pt denies falls, but states he walks like a drunk man. Pt wearing nice dressier shoes today. Pt reports increased difficulty with any household tasks that requires him to stand, due to his balance, but primarily is disappointed he can no longer dance.   Reports it feels like he is walking on sponges from where his feet are numb. Pt states standing in 1 place causes his feet to be more numb.  Pt accompanied by: self  PERTINENT HISTORY: Pt reports hx of hernial operation and prior R hand/wrist fx with no residual deficits, but otherwise healthy PMH: HTN, thyroid  disease, and diabetes  PAIN:  Are you having pain? No  PRECAUTIONS: Fall  RED FLAGS: None   WEIGHT BEARING RESTRICTIONS: No  FALLS: Has patient fallen in last 6 months? No and states he walks like a drunk and stumbles a lot  LIVING ENVIRONMENT: Lives with: lives alone Lives in: House/apartment Stairs: Yes: External: 3 steps; none or can enter through the garage with 1 step-up to enter Has following equipment at home: built in shower seat  PLOF: Independent with basic ADLs, Independent with homemaking with ambulation, and Independent with gait  PATIENT GOALS: prevent his balance from getting worse and ideally go back to work and return to dancing  OBJECTIVE:  Note: Objective measures were completed at Evaluation unless otherwise noted.  DIAGNOSTIC FINDINGS:   EMG Impression: Abnormal study. 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  Electronically signed by Roby Chris, MD at 07/24/2023 12:08 PM EST   COGNITION: Overall cognitive status: Within functional limits for tasks assessed   SENSATION: Light touch: Impaired bilaterally with R LE more impaired than L Proprioception: not formally assessed, but appears impaired during functional mobility  COORDINATION: WFL heel to shin  EDEMA:  None observed on eval  MUSCLE TONE: appears WNL  MUSCLE LENGTH: Not formally assessed  DTRs:  Patella: may benefit from testing Achilles: may benefit from testing  POSTURE: rounded shoulders, forward head, and posterior pelvic tilt  LOWER EXTREMITY ROM:     Active   WFL throughout B LEs Right Eval Left Eval  Hip flexion    Hip extension    Hip abduction    Hip adduction    Hip internal rotation    Hip external rotation    Knee flexion    Knee extension    Ankle dorsiflexion    Ankle plantarflexion    Ankle inversion    Ankle eversion     (Blank rows = not tested)  LOWER EXTREMITY MMT:    MMT Right Eval Left Eval  Hip flexion 5 5  Hip extension    Hip abduction    Hip adduction    Hip internal rotation    Hip external rotation    Knee flexion 5 5  Knee extension 5 5  Ankle dorsiflexion 4- 4-  Ankle plantarflexion 4- 4-  Ankle inversion    Ankle eversion    (Blank rows = not tested)  Manual Muscle Test Scale 0/5 = No muscle contraction can be seen or felt 1/5 = Contraction can be felt, but there is no motion 2-/5 = Part moves through incomplete ROM w/ gravity decreased 2/5 = Part moves through complete ROM w/ gravity decreased 2+/5 = Part moves through incomplete ROM (<50%) against gravity or through complete ROM w/ gravity 3-/5 = Part moves through incomplete ROM (>50%) against gravity 3/5 = Part  moves through complete ROM against  gravity 3+/5 = Part moves through complete ROM against gravity/slight resistance 4-/5= Holds test position against slight to moderate pressure 4/5 = Part moves through complete ROM against gravity/moderate resistance 4+/5= Holds test position against moderate to strong pressure 5/5 = Part moves through complete ROM against gravity/full resistance  BED MOBILITY:  Findings: not formally assessed, but not reports of difficulty  TRANSFERS: Sit to stand: CGA  Assistive device utilized: None     Stand to sit: CGA  Assistive device utilized: None     Chair to chair: CGA  Assistive device utilized: None       RAMP:  Not tested, but would benefit from testing  CURB:  Not tested, but would benefit from testing  STAIRS: Need to assess GAIT: Findings: Gait Characteristics: excessive posterior lean throughout with his weight primarily through his heels, step through pattern, decreased step length- Right, decreased step length- Left, decreased stride length, and narrow BOS, Distance walked: ~155ft,  Assistive device utilized:None Level of assistance: CGA Comments: excessive heel strike with lateral bias and pt doesn't transfer weight through midfoot nor forefoot during stance, excessive posterior pelvic tilt throughout with compensatory excessive thoracic rounding to prevent posterior LOB  FUNCTIONAL TESTS:  6 minute walk test: need to assess 10 meter walk test: 0.730m/s Berg Balance Scale: 34/56 Functional gait assessment: need to assess  PATIENT SURVEYS:  ABC scale 56.25% with pt reporting primarily 50% confident with majority of the items including walking around the house - pt reporting he is unsteady with all standing tasks                                                                                                                              TREATMENT DATE: 02/25/2024  PT applied gait belt and provided CGA throughout session for improved safety and reduced fall rise nless  otherwise noted.  Nustep BUE/BLE x 6 min total level 1-5 with varied resistance. Cues to keep pressure through forefoot throughout endurance training.  Sit<>stand 2 x 10 with UE supported on thighs, progressing to no UE.  Side stepping R and L 24ft x 6  Ankle PF YTB x 15; RTB x 20 AROM ankle eversion x 15  LE push down GTB x 12 bil  Leg press: 70 x 18  85x 18 90x 15 Attempted Leg press  ankle PF with 25#, but unable to coordinate movement on this day   Standing hip extension x 12 bil at rail.  Gait with rollator with cues and instruction for improved hip extension and toe off to improve forward propulsion and normalized gait pattern. Limited hip extension or toe off with gait on this day.   PATIENT EDUCATION: Education details: Therapy POC, findings on evaluation today, performance of daily foot assessments, balance systems Pt educated throughout session about proper posture and technique with exercises. Improved exercise technique, movement at target joints, use of target muscles after min to mod  verbal, visual, tactile cues.  Person educated: Patient Education method: Explanation Education comprehension: verbalized understanding and needs further education  HOME EXERCISE PROGRAM:  Access Code: [email or web address] URL: [email or web address] Date: 02/10/2024 Prepared by: Aurora Lees  Exercises - Wide Stance with Counter Support  - 1 x daily - 7 x weekly - 3 sets - 30 seconds hold - Sit to Stand with Counter Support  - 1 x daily - 7 x weekly - 2 sets - 10 reps - Seated Ankle Plantar Flexion with Resistance Loop  - 1 x daily - 7 x weekly - 2 sets - 10 reps - Seated Ankle Eversion with Anchored Resistance  - 1 x daily - 7 x weekly - 2 sets - 10 reps - Standing Romberg to 1/2 Tandem Stance  - 1 x daily - 7 x weekly - 3 sets - 10 reps - 20 hold - Backward Walking with Counter Support  - 1 x daily - 7 x weekly - 3 sets - 10 reps - Side Lunge with Counter Support  - 1 x daily - 7  x weekly - 3 sets - 10 reps   GOALS: Goals reviewed with patient? Yes  SHORT TERM GOALS: Target date: 02/22/2024  Patient will be independent in home exercise program to improve strength/mobility and balance for better functional independence with ADLs.  Baseline: need to initiate Goal status: INITIAL   LONG TERM GOALS: Target date: 04/04/2024  Patient will increase Berg Balance score to > 51/56 to demonstrate improved balance and decreased fall risk during functional activities and ADLs.  Baseline: 34/56 02/16/24: 40/56 Goal status: PROGRESSING  2.  Patient will increase Functional Gait Assessment (FGA) score to >20/30 as to reduce fall risk and improve dynamic gait safety with community ambulation.  Baseline: 5/30 due to pt frequently reaching for wall support from being unbalanced 02/16/24: 20/30 Goal status: PROGRESSING  3.  Patient will increase 10 meter walk test to >1.39m/s as to improve gait speed for better community ambulation and to reduce fall risk. Baseline: 0.763m/s without AD, CGA 02/16/24: 0.923 m/s without AD, CGA Goal status: PROGRESSING  4.  Patient will increase six minute walk test distance to >1000 for progression to community ambulator and improve gait ability  Baseline:  699 feet having to stop the test at 51min16sec due to fatigue (213 meters, Avg speed 0.71m/s) no AD, with CGA and pt frequently reaching for the wall to support his imbalance 02/16/24: 740' with no AD Goal status: PROGRESSING  5.  Patient will increase ABC scale score >80% to demonstrate better functional mobility and better confidence with ADLs.  Baseline: 56.25% 02/16/24: 70.63% Goal status: PROGRESSING   ASSESSMENT:  CLINICAL IMPRESSION:   Pt reports to PT motivated to participate. PT treatment continued to focus on improved BLE strength to increase gluteal and ankle PF ROM and strength. Is noted to have improved strength in open chain movement into PF with increased repetition in full range  at PF, but limited movement and functional toe off with gait on this day.  Pt will continue to benefit from skilled therapy to address remaining deficits in order to improve overall QoL and return to PLOF.      OBJECTIVE IMPAIRMENTS: Abnormal gait, decreased balance, decreased knowledge of condition, decreased knowledge of use of DME, decreased mobility, difficulty walking, decreased strength, impaired sensation, and improper body mechanics.   ACTIVITY LIMITATIONS: carrying, lifting, bending, standing, squatting, stairs, transfers, bathing, toileting, dressing, hygiene/grooming, and locomotion level  PARTICIPATION LIMITATIONS: meal prep, cleaning,  laundry, shopping, community activity, occupation, yard work, and dancing  PERSONAL FACTORS: Age, Time since onset of injury/illness/exacerbation, and 3+ comorbidities: HTN, thyroid  disease, and diabetes are also affecting patient's functional outcome.   REHAB POTENTIAL: Good  CLINICAL DECISION MAKING: Stable/uncomplicated  EVALUATION COMPLEXITY: Moderate  PLAN:  PT FREQUENCY: 1-2x/week  PT DURATION: 12 weeks  PLANNED INTERVENTIONS: 97164- PT Re-evaluation, 97750- Physical Performance Testing, 97110-Therapeutic exercises, 97530- Therapeutic activity, [id number]- Neuromuscular re-education, 97535- Self Care, 64403- Manual therapy, [id number]- Gait training, [phone]- Canalith repositioning, [phone]- Electrical stimulation (manual), Patient/Family education, Balance training, Stair training, Joint mobilization, Vestibular training, DME instructions, Cryotherapy, Moist heat, and Biofeedback   PLAN FOR NEXT SESSION:   - eyes closed as appropriate, daily walking, etc. - standing balance focusing on foot flat as pt tends to have posterior lean/wt shift into heels as well as narrow stance - gait training with improved foot rocker, rolling through toes during terminal stance/pre-swing -continue expanding ankle strengthening.   Aurora Lees PT, DPT  Physical  Therapist - [place]  [facility]  2:49 PM 02/25/24

## 2024-02-26 ENCOUNTER — Encounter: Payer: Self-pay | Admitting: Physician Assistant

## 2024-02-26 ENCOUNTER — Ambulatory Visit (INDEPENDENT_AMBULATORY_CARE_PROVIDER_SITE_OTHER): Admitting: Physician Assistant

## 2024-02-26 VITALS — BP 132/80 | HR 77 | Temp 98.0°F | Resp 16 | Ht 69.0 in | Wt 200.0 lb

## 2024-02-26 DIAGNOSIS — E1165 Type 2 diabetes mellitus with hyperglycemia: Secondary | ICD-10-CM | POA: Diagnosis not present

## 2024-02-26 DIAGNOSIS — N1831 Chronic kidney disease, stage 3a: Secondary | ICD-10-CM

## 2024-02-26 DIAGNOSIS — I10 Essential (primary) hypertension: Secondary | ICD-10-CM

## 2024-02-26 MED ORDER — FUROSEMIDE 20 MG PO TABS: 20.0000 mg | ORAL_TABLET | Freq: Every day | ORAL | 3 refills | Status: DC

## 2024-02-26 NOTE — Progress Notes (Signed)
 [facility] [address] [place], [place] [address]  Internal MEDICINE  Office Visit Note  Patient Name: Adam Castillo  [id number]  [id number]  Date of Service: 02/26/2024  Chief Complaint  Patient presents with   Follow-up    PFT   Hypertension    HPI Pt is here for routine follow up -BP stable, taking 1/2 his lasix  dose now, renal functions improving, but still abnormal. Discussed further reduction of lasix  since no signs of fluid overload--drop to 20mg  -urine microalbumin normal -PFT reviewed: mild obstructive and restrictive pattern. Pt states he is really only SOB if walking a long ways. No wheezing. -130s BG in AM, 140s in evening  Current Medication: Outpatient Encounter Medications as of 02/26/2024  Medication Sig   Alpha-Lipoic Acid 100 MG CAPS Take 1 capsule (100 mg total) by mouth daily.   aspirin EC 81 MG tablet Take 81 mg by mouth daily.   b complex vitamins capsule Take 1 capsule by mouth daily.   cholecalciferol (VITAMIN D3) 25 MCG (1000 UNIT) tablet Take 1,000 Units by mouth daily.   furosemide  (LASIX ) 20 MG tablet Take 1 tablet (20 mg total) by mouth daily.   gabapentin  (NEURONTIN ) 100 MG capsule Take 100 mg by mouth daily.   levothyroxine  (SYNTHROID ) 50 MCG tablet TAKE 1 TABLET BY MOUTH EVERY DAY   lisinopril  (ZESTRIL ) 40 MG tablet TAKE 1 TABLET BY MOUTH EVERY DAY   metFORMIN  (GLUCOPHAGE ) 500 MG tablet Take 1 tablet (500 mg total) by mouth 2 (two) times daily with a meal.   Multiple Vitamins-Minerals (CENTRUM SILVER 50+MEN PO) Take 1 tablet by mouth daily.   potassium chloride (MICRO-K) 10 MEQ CR capsule Take 10 mEq by mouth daily.   [DISCONTINUED] furosemide  (LASIX ) 80 MG tablet Take 80 mg by mouth daily.   [DISCONTINUED] simvastatin  (ZOCOR ) 20 MG tablet TAKE 1 TABLET BY MOUTH EVERY DAY   No facility-administered encounter medications on file as of 02/26/2024.    Surgical History: Past Surgical History:  Procedure Laterality Date    COLONOSCOPY WITH PROPOFOL      COLONOSCOPY WITH PROPOFOL  N/A 10/15/2021   Procedure: COLONOSCOPY WITH PROPOFOL ;  Surgeon: Therisa Bi, MD;  Location: [facility] ENDOSCOPY;  Service: Gastroenterology;  Laterality: N/A;   HERNIA REPAIR     INSERTION OF MESH  08/27/2021   Procedure: INSERTION OF MESH;  Surgeon: Jordis Laneta FALCON, MD;  Location: [facility] ORS;  Service: General;;    Medical History: Past Medical History:  Diagnosis Date   Diabetes mellitus without complication (HCC)    Hypertension    Thyroid  disease     Family History: Family History  Problem Relation Age of Onset   COPD Mother    Heart disease Father     Social History   Socioeconomic History   Marital status: Single    Spouse name: Not on file   Number of children: Not on file   Years of education: Not on file   Highest education level: Not on file  Occupational History   Not on file  Tobacco Use   Smoking status: Never   Smokeless tobacco: Never  Vaping Use   Vaping status: Never Used  Substance and Sexual Activity   Alcohol use: Never   Drug use: Never   Sexual activity: Not on file  Other Topics Concern   Not on file  Social History Narrative   Not on file   Social Drivers of Health   Financial Resource Strain: Low Risk  (10/19/2023)   Received  from [facility]   Overall Financial Resource Strain (CARDIA)    Difficulty of Paying Living Expenses: Not very hard  Food Insecurity: No Food Insecurity (10/19/2023)   Received from [facility]   Hunger Vital Sign    Within the past 12 months, you worried that your food would run out before you got the money to buy more.: Never true    Within the past 12 months, the food you bought just didn't last and you didn't have money to get more.: Never true  Transportation Needs: No Transportation Needs (10/19/2023)   Received from [facility] - Transportation    In the past 12 months, has lack of transportation  kept you from medical appointments or from getting medications?: No    Lack of Transportation (Non-Medical): No  Physical Activity: Inactive (08/11/2022)   Exercise Vital Sign    Days of Exercise per Week: 0 days    Minutes of Exercise per Session: 0 min  Stress: No Stress Concern Present (08/11/2022)   [employer] of Occupational Health - Occupational Stress Questionnaire    Feeling of Stress : Only a little  Social Connections: Socially Isolated (08/11/2022)   Social Connection and Isolation Panel    Frequency of Communication with Friends and Family: Twice a week    Frequency of Social Gatherings with Friends and Family: Once a week    Attends Religious Services: Never    Database administrator or Organizations: No    Attends Banker Meetings: Never    Marital Status: Divorced  Catering manager Violence: Not At Risk (08/11/2022)   Humiliation, Afraid, Rape, and Kick questionnaire    Fear of Current or Ex-Partner: No    Emotionally Abused: No    Physically Abused: No    Sexually Abused: No      Review of Systems  Constitutional:  Negative for chills, fatigue and unexpected weight change.  HENT:  Positive for postnasal drip. Negative for congestion, rhinorrhea, sneezing and sore throat.   Eyes:  Negative for redness.  Respiratory:  Negative for cough, chest tightness and wheezing.        SOBOE  Cardiovascular:  Negative for chest pain and palpitations.  Gastrointestinal:  Negative for abdominal pain, constipation, diarrhea, nausea and vomiting.  Genitourinary:  Negative for dysuria and frequency.  Musculoskeletal:  Negative for arthralgias, back pain, joint swelling and neck pain.  Skin:  Negative for rash.  Neurological: Negative.  Negative for tremors and numbness.  Hematological:  Negative for adenopathy. Does not bruise/bleed easily.  Psychiatric/Behavioral:  Negative for behavioral problems (Depression), sleep disturbance and suicidal ideas. The  patient is not nervous/anxious.     Vital Signs: BP 132/80   Pulse 77   Temp 98 F (36.7 C)   Resp 16   Ht 5' 9 (1.753 m)   Wt 200 lb (90.7 kg)   SpO2 97%   BMI 29.53 kg/m    Physical Exam Vitals and nursing note reviewed.  Constitutional:      General: He is not in acute distress.    Appearance: He is well-developed. He is not diaphoretic.  HENT:     Head: Normocephalic and atraumatic.  Eyes:     Pupils: Pupils are equal, round, and reactive to light.  Neck:     Thyroid : No thyromegaly.     Vascular: No JVD.     Trachea: No tracheal deviation.  Cardiovascular:     Rate  and Rhythm: Normal rate and regular rhythm.     Heart sounds: Normal heart sounds. No murmur heard.    No friction rub. No gallop.  Pulmonary:     Effort: Pulmonary effort is normal. No respiratory distress.     Breath sounds: No wheezing or rales.  Chest:     Chest wall: No tenderness.  Musculoskeletal:     Right lower leg: No edema.     Left lower leg: No edema.  Skin:    General: Skin is warm and dry.  Neurological:     Mental Status: He is alert and oriented to person, place, and time.  Psychiatric:        Behavior: Behavior normal.        Thought Content: Thought content normal.        Judgment: Judgment normal.        Assessment/Plan: 1. Stage 3a chronic kidney disease (HCC) (Primary) Improving, will reduce lasix  further and recheck labs - Basic Metabolic Panel (BMET)  2. Essential hypertension Stable, continue current medication. Lasix  reduced further to 20mg   3. Type 2 diabetes mellitus with hyperglycemia, without long-term current use of insulin (HCC) Continue current medication and monitoring   General Counseling: yehoshua vitelli understanding of the findings of todays visit and agrees with plan of treatment. I have discussed any further diagnostic evaluation that may be needed or ordered today. We also reviewed his medications today. he has been encouraged to call the  office with any questions or concerns that should arise related to todays visit.    Orders Placed This Encounter  Procedures   Basic Metabolic Panel (BMET)    Meds ordered this encounter  Medications   furosemide  (LASIX ) 20 MG tablet    Sig: Take 1 tablet (20 mg total) by mouth daily.    Dispense:  90 tablet    Refill:  3    This patient was seen by Tinnie Pro, PA-C in collaboration with Dr. Sigrid Bathe as a part of collaborative care agreement.   Total time spent:30 Minutes Time spent includes review of chart, medications, test results, and follow up plan with the patient.      Dr Fozia M Khan Internal medicine

## 2024-02-29 ENCOUNTER — Ambulatory Visit: Admitting: Physical Therapy

## 2024-02-29 ENCOUNTER — Other Ambulatory Visit: Payer: Self-pay | Admitting: Physician Assistant

## 2024-02-29 DIAGNOSIS — R262 Difficulty in walking, not elsewhere classified: Secondary | ICD-10-CM | POA: Diagnosis not present

## 2024-02-29 DIAGNOSIS — R531 Weakness: Secondary | ICD-10-CM

## 2024-02-29 DIAGNOSIS — R269 Unspecified abnormalities of gait and mobility: Secondary | ICD-10-CM

## 2024-02-29 DIAGNOSIS — E039 Hypothyroidism, unspecified: Secondary | ICD-10-CM

## 2024-02-29 DIAGNOSIS — R2681 Unsteadiness on feet: Secondary | ICD-10-CM

## 2024-02-29 MED ORDER — SIMVASTATIN 20 MG PO TABS: 20.0000 mg | ORAL_TABLET | Freq: Every day | ORAL | 3 refills | Status: AC

## 2024-02-29 NOTE — Therapy (Unsigned)
 OUTPATIENT PHYSICAL THERAPY NEURO TREATMENT  Patient Name: Adam Castillo MRN: [id number] DOB:[birth year], 78 y.o., male Today's Date: 02/29/2024   PCP: Jacques Mattock, PA-C  REFERRING PROVIDER: Bufford Carne, MD   END OF SESSION:   PT End of Session - 02/29/24 1537     Visit Number 14    Number of Visits 24    Date for PT Re-Evaluation 04/04/24    PT Start Time 1533    PT Stop Time 1613    PT Time Calculation (min) 40 min    Equipment Utilized During Treatment Gait belt    Activity Tolerance Patient tolerated treatment well    Behavior During Therapy WFL for tasks assessed/performed            Past Medical History:  Diagnosis Date   Diabetes mellitus without complication (HCC)    Hypertension    Thyroid  disease    Past Surgical History:  Procedure Laterality Date   COLONOSCOPY WITH PROPOFOL      COLONOSCOPY WITH PROPOFOL  N/A 10/15/2021   Procedure: COLONOSCOPY WITH PROPOFOL ;  Surgeon: Luke Salaam, MD;  Location: [facility];  Service: Gastroenterology;  Laterality: N/A;   HERNIA REPAIR     INSERTION OF MESH  08/27/2021   Procedure: INSERTION OF MESH;  Surgeon: Alben Alma, MD;  Location: [facility] ORS;  Service: General;;   Patient Active Problem List   Diagnosis Date Noted   Hernia of scrotum 11/02/2020   Unilateral recurrent inguinal hernia without obstruction or gangrene 11/02/2020   Stage 3a chronic kidney disease (HCC) 08/14/2020   Annual physical exam 08/02/2020   Abrasion of right hand 08/02/2020   Primary osteoarthritis of right knee 06/12/2020   Hypothyroidism due to Hashimoto's thyroiditis 02/10/2020   Dyslipidemia 02/10/2020   Essential hypertension 02/07/2020    ONSET DATE: Reports steady decline over past 2 years  REFERRING DIAG:  G62.9 (ICD-10-CM) - Polyneuropathy  R26.9 (ICD-10-CM) - Gait abnormality  R26.89 (ICD-10-CM) - Balance disorder    THERAPY DIAG:  Difficulty in walking  Abnormality of gait  Unsteadiness on  feet  Generalized weakness  Rationale for Evaluation and Treatment: Rehabilitation  SUBJECTIVE:                                                                                                                                                                                             SUBJECTIVE STATEMENT:  Pt reports he is doing well  no pain reported by Pt.  No falls over the weekend, but state that he feels a little more worn out because of the heat.   From Initial Eval: Pt reports steady decline in  his balance over the past 2 years after he retired. 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 Pt denies falls, but states he walks like a drunk man. Pt wearing nice dressier shoes today. Pt reports increased difficulty with any household tasks that requires him to stand, due to his balance, but primarily is disappointed he can no longer dance.   Reports it feels like he is walking on sponges from where his feet are numb. Pt states standing in 1 place causes his feet to be more numb.  Pt accompanied by: self  PERTINENT HISTORY: Pt reports hx of hernial operation and prior R hand/wrist fx with no residual deficits, but otherwise healthy PMH: HTN, thyroid  disease, and diabetes  PAIN:  Are you having pain? No  PRECAUTIONS: Fall  RED FLAGS: None   WEIGHT BEARING RESTRICTIONS: No  FALLS: Has patient fallen in last 6 months? No and states he walks like a drunk and stumbles a lot  LIVING ENVIRONMENT: Lives with: lives alone Lives in: House/apartment Stairs: Yes: External: 3 steps; none or can enter through the garage with 1 step-up to enter Has following equipment at home: built in shower seat  PLOF: Independent with basic ADLs, Independent with homemaking with ambulation, and Independent with gait  PATIENT GOALS: prevent his balance from getting worse and ideally go back to work and return to dancing  OBJECTIVE:  Note:  Objective measures were completed at Evaluation unless otherwise noted.  DIAGNOSTIC FINDINGS:  EMG Impression: Abnormal study. 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  Electronically signed by Roby Chris, MD at 07/24/2023 12:08 PM EST   COGNITION: Overall cognitive status: Within functional limits for tasks assessed   SENSATION: Light touch: Impaired bilaterally with R LE more impaired than L Proprioception: not formally assessed, but appears impaired during functional mobility  COORDINATION: WFL heel to shin  EDEMA:  None observed on eval  MUSCLE TONE: appears WNL  MUSCLE LENGTH: Not formally assessed  DTRs:  Patella: may benefit from testing Achilles: may benefit from testing  POSTURE: rounded shoulders, forward head, and posterior pelvic tilt  LOWER EXTREMITY ROM:     Active   WFL throughout B LEs Right Eval Left Eval  Hip flexion    Hip extension    Hip abduction    Hip adduction    Hip internal rotation    Hip external rotation    Knee flexion    Knee extension    Ankle dorsiflexion    Ankle plantarflexion    Ankle inversion    Ankle eversion     (Blank rows = not tested)  LOWER EXTREMITY MMT:    MMT Right Eval Left Eval  Hip flexion 5 5  Hip extension    Hip abduction    Hip adduction    Hip internal rotation    Hip external rotation    Knee flexion 5 5  Knee extension 5 5  Ankle dorsiflexion 4- 4-  Ankle plantarflexion 4- 4-  Ankle inversion    Ankle eversion    (Blank rows = not tested)  Manual Muscle Test Scale 0/5 = No muscle contraction can be seen or felt 1/5 = Contraction can be felt, but there is no motion 2-/5 = Part moves through incomplete ROM w/ gravity decreased 2/5 = Part moves through complete ROM w/ gravity decreased 2+/5 = Part moves through incomplete ROM (<50%) against  gravity or through complete ROM w/ gravity 3-/5 = Part moves  through incomplete ROM (>50%) against gravity 3/5 = Part moves through complete ROM against gravity 3+/5 = Part moves through complete ROM against gravity/slight resistance 4-/5= Holds test position against slight to moderate pressure 4/5 = Part moves through complete ROM against gravity/moderate resistance 4+/5= Holds test position against moderate to strong pressure 5/5 = Part moves through complete ROM against gravity/full resistance  BED MOBILITY:  Findings: not formally assessed, but not reports of difficulty  TRANSFERS: Sit to stand: CGA  Assistive device utilized: None     Stand to sit: CGA  Assistive device utilized: None     Chair to chair: CGA  Assistive device utilized: None       RAMP:  Not tested, but would benefit from testing  CURB:  Not tested, but would benefit from testing  STAIRS: Need to assess GAIT: Findings: Gait Characteristics: excessive posterior lean throughout with his weight primarily through his heels, step through pattern, decreased step length- Right, decreased step length- Left, decreased stride length, and narrow BOS, Distance walked: ~129ft,  Assistive device utilized:None Level of assistance: CGA Comments: excessive heel strike with lateral bias and pt doesn't transfer weight through midfoot nor forefoot during stance, excessive posterior pelvic tilt throughout with compensatory excessive thoracic rounding to prevent posterior LOB  FUNCTIONAL TESTS:  6 minute walk test: need to assess 10 meter walk test: 0.780m/s Berg Balance Scale: 34/56 Functional gait assessment: need to assess  PATIENT SURVEYS:  ABC scale 56.25% with pt reporting primarily 50% confident with majority of the items including walking around the house - pt reporting he is unsteady with all standing tasks                                                                                                                              TREATMENT DATE: 02/29/2024  PT applied gait belt  and provided CGA throughout session for improved safety and reduced fall rise nless otherwise noted.  Nustep BUE/BLE x 6 min total level 1-5 with varied resistance. Cues to keep pressure through forefoot throughout endurance training.   Ankle PF GTB 2 x 15 bil  Seated heel lift with 15# kettle bell on knee x 15   Standing on airex pad.  Normal BOS 2  x 30 sec  Partial tandem 2 x 20 sec bil   Standing on level surface  Eyes closed 15sec x3 Partial tandem with increased  step length compared to on pad  2 x 15 sec bil  HS curl x 12  Hip extension  x 10  Squat x 10   Forward/reverse gait 37ft x 4 cues for toe contact with reverse.  Improved WB in forefoot with semitandem and reverse gait on this day as well as improved ankle ROM with resistive training   PATIENT EDUCATION: Education details: Therapy POC, findings on evaluation today, performance of daily foot assessments, balance systems Pt  educated throughout session about proper posture and technique with exercises. Improved exercise technique, movement at target joints, use of target muscles after min to mod verbal, visual, tactile cues.  Person educated: Patient Education method: Explanation Education comprehension: verbalized understanding and needs further education  HOME EXERCISE PROGRAM:  Access Code: [email or web address] URL: [email or web address] Date: 02/10/2024 Prepared by: Aurora Lees  Exercises - Wide Stance with Counter Support  - 1 x daily - 7 x weekly - 3 sets - 30 seconds hold - Sit to Stand with Counter Support  - 1 x daily - 7 x weekly - 2 sets - 10 reps - Seated Ankle Plantar Flexion with Resistance Loop  - 1 x daily - 7 x weekly - 2 sets - 10 reps - Seated Ankle Eversion with Anchored Resistance  - 1 x daily - 7 x weekly - 2 sets - 10 reps - Standing Romberg to 1/2 Tandem Stance  - 1 x daily - 7 x weekly - 3 sets - 10 reps - 20 hold - Backward Walking with Counter Support  - 1 x daily - 7 x weekly - 3  sets - 10 reps - Side Lunge with Counter Support  - 1 x daily - 7 x weekly - 3 sets - 10 reps   GOALS: Goals reviewed with patient? Yes  SHORT TERM GOALS: Target date: 02/22/2024  Patient will be independent in home exercise program to improve strength/mobility and balance for better functional independence with ADLs.  Baseline: need to initiate Goal status: INITIAL   LONG TERM GOALS: Target date: 04/04/2024  Patient will increase Berg Balance score to > 51/56 to demonstrate improved balance and decreased fall risk during functional activities and ADLs.  Baseline: 34/56 02/16/24: 40/56 Goal status: PROGRESSING  2.  Patient will increase Functional Gait Assessment (FGA) score to >20/30 as to reduce fall risk and improve dynamic gait safety with community ambulation.  Baseline: 5/30 due to pt frequently reaching for wall support from being unbalanced 02/16/24: 20/30 Goal status: PROGRESSING  3.  Patient will increase 10 meter walk test to >1.12m/s as to improve gait speed for better community ambulation and to reduce fall risk. Baseline: 0.793m/s without AD, CGA 02/16/24: 0.923 m/s without AD, CGA Goal status: PROGRESSING  4.  Patient will increase six minute walk test distance to >1000 for progression to community ambulator and improve gait ability  Baseline:  699 feet having to stop the test at 95min16sec due to fatigue (213 meters, Avg speed 0.75m/s) no AD, with CGA and pt frequently reaching for the wall to support his imbalance 02/16/24: 740' with no AD Goal status: PROGRESSING  5.  Patient will increase ABC scale score >80% to demonstrate better functional mobility and better confidence with ADLs.  Baseline: 56.25% 02/16/24: 70.63% Goal status: PROGRESSING   ASSESSMENT:  CLINICAL IMPRESSION:   Pt reports to PT motivated to participate. PT treatment continued to focus on improved BLE strength to increase gluteal and ankle PF ROM and strength. Increased balance training with unlevel  surface and eyes closed to force use of ankle musculature to correct LOB. Tolerated all interventions well and was noted to have improved ROM with increased resistance into PF. Encouraged to perform reverse gait with UE support from HEP more consistently.  Pt will continue to benefit from skilled therapy to address remaining deficits in order to improve overall QoL and return to PLOF.      OBJECTIVE IMPAIRMENTS: Abnormal gait, decreased balance, decreased knowledge of condition, decreased knowledge of  use of DME, decreased mobility, difficulty walking, decreased strength, impaired sensation, and improper body mechanics.   ACTIVITY LIMITATIONS: carrying, lifting, bending, standing, squatting, stairs, transfers, bathing, toileting, dressing, hygiene/grooming, and locomotion level  PARTICIPATION LIMITATIONS: meal prep, cleaning, laundry, shopping, community activity, occupation, yard work, and dancing  PERSONAL FACTORS: Age, Time since onset of injury/illness/exacerbation, and 3+ comorbidities: HTN, thyroid  disease, and diabetes are also affecting patient's functional outcome.   REHAB POTENTIAL: Good  CLINICAL DECISION MAKING: Stable/uncomplicated  EVALUATION COMPLEXITY: Moderate  PLAN:  PT FREQUENCY: 1-2x/week  PT DURATION: 12 weeks  PLANNED INTERVENTIONS: 97164- PT Re-evaluation, 97750- Physical Performance Testing, 97110-Therapeutic exercises, 97530- Therapeutic activity, [id number]- Neuromuscular re-education, 97535- Self Care, [id number]- Manual therapy, [id number]- Gait training, [phone]- Canalith repositioning, [phone]- Electrical stimulation (manual), Patient/Family education, Balance training, Stair training, Joint mobilization, Vestibular training, DME instructions, Cryotherapy, Moist heat, and Biofeedback   PLAN FOR NEXT SESSION:   - eyes closed as appropriate, daily walking, etc. - standing balance focusing on foot flat as pt tends to have posterior lean/wt shift into heels as well as narrow  stance - gait training with improved foot rocker, rolling through toes during terminal stance/pre-swing -continue expanding ankle strengthening.   Aurora Lees PT, DPT  Physical Therapist - [place]  [facility]  3:38 PM 02/29/24

## 2024-03-02 ENCOUNTER — Ambulatory Visit

## 2024-03-02 DIAGNOSIS — R262 Difficulty in walking, not elsewhere classified: Secondary | ICD-10-CM

## 2024-03-02 DIAGNOSIS — R269 Unspecified abnormalities of gait and mobility: Secondary | ICD-10-CM

## 2024-03-02 DIAGNOSIS — R531 Weakness: Secondary | ICD-10-CM

## 2024-03-02 DIAGNOSIS — R2681 Unsteadiness on feet: Secondary | ICD-10-CM

## 2024-03-02 NOTE — Therapy (Signed)
 OUTPATIENT PHYSICAL THERAPY NEURO TREATMENT  Patient Name: Adam Castillo MRN: [id number] DOB:[birth year], 78 y.o., male Today's Date: 03/02/2024   PCP: Jacques Mattock, PA-C  REFERRING PROVIDER: Bufford Carne, MD   END OF SESSION:   PT End of Session - 03/02/24 1521     Visit Number 15    Number of Visits 24    Date for PT Re-Evaluation 04/04/24    PT Start Time 1520    PT Stop Time 1605    PT Time Calculation (min) 45 min    Equipment Utilized During Treatment Gait belt    Activity Tolerance Patient tolerated treatment well    Behavior During Therapy WFL for tasks assessed/performed             Past Medical History:  Diagnosis Date   Diabetes mellitus without complication (HCC)    Hypertension    Thyroid  disease    Past Surgical History:  Procedure Laterality Date   COLONOSCOPY WITH PROPOFOL      COLONOSCOPY WITH PROPOFOL  N/A 10/15/2021   Procedure: COLONOSCOPY WITH PROPOFOL ;  Surgeon: Luke Salaam, MD;  Location: [facility] ENDOSCOPY;  Service: Gastroenterology;  Laterality: N/A;   HERNIA REPAIR     INSERTION OF MESH  08/27/2021   Procedure: INSERTION OF MESH;  Surgeon: Alben Alma, MD;  Location: [facility] ORS;  Service: General;;   Patient Active Problem List   Diagnosis Date Noted   Hernia of scrotum 11/02/2020   Unilateral recurrent inguinal hernia without obstruction or gangrene 11/02/2020   Stage 3a chronic kidney disease (HCC) 08/14/2020   Annual physical exam 08/02/2020   Abrasion of right hand 08/02/2020   Primary osteoarthritis of right knee 06/12/2020   Hypothyroidism due to Hashimoto's thyroiditis 02/10/2020   Dyslipidemia 02/10/2020   Essential hypertension 02/07/2020    ONSET DATE: Reports steady decline over past 2 years  REFERRING DIAG:  G62.9 (ICD-10-CM) - Polyneuropathy  R26.9 (ICD-10-CM) - Gait abnormality  R26.89 (ICD-10-CM) - Balance disorder    THERAPY DIAG:  Difficulty in walking  Abnormality of gait  Unsteadiness on  feet  Generalized weakness  Rationale for Evaluation and Treatment: Rehabilitation  SUBJECTIVE:                                                                                                                                                                                             SUBJECTIVE STATEMENT:  Patient reports no falls or LOB. Mowed lawn today. Has been compliant with HEP.   From Initial Eval: Pt reports steady decline in his balance over the past 2 years after he retired. 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 Pt denies falls, but states he walks like a drunk man. Pt wearing nice dressier shoes today. Pt reports increased difficulty with any household tasks that requires him to stand, due to his balance, but primarily is disappointed he can no longer dance.   Reports it feels like he is walking on sponges from where his feet are numb. Pt states standing in 1 place causes his feet to be more numb.  Pt accompanied by: self  PERTINENT HISTORY: Pt reports hx of hernial operation and prior R hand/wrist fx with no residual deficits, but otherwise healthy PMH: HTN, thyroid  disease, and diabetes  PAIN:  Are you having pain? No  PRECAUTIONS: Fall  RED FLAGS: None   WEIGHT BEARING RESTRICTIONS: No  FALLS: Has patient fallen in last 6 months? No and states he walks like a drunk and stumbles a lot  LIVING ENVIRONMENT: Lives with: lives alone Lives in: House/apartment Stairs: Yes: External: 3 steps; none or can enter through the garage with 1 step-up to enter Has following equipment at home: built in shower seat  PLOF: Independent with basic ADLs, Independent with homemaking with ambulation, and Independent with gait  PATIENT GOALS: prevent his balance from getting worse and ideally go back to work and return to dancing  OBJECTIVE:  Note: Objective measures were completed at Evaluation unless otherwise  noted.  DIAGNOSTIC FINDINGS:  EMG Impression: Abnormal study. 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  Electronically signed by Roby Chris, MD at 07/24/2023 12:08 PM EST   COGNITION: Overall cognitive status: Within functional limits for tasks assessed   SENSATION: Light touch: Impaired bilaterally with R LE more impaired than L Proprioception: not formally assessed, but appears impaired during functional mobility  COORDINATION: WFL heel to shin  EDEMA:  None observed on eval  MUSCLE TONE: appears WNL  MUSCLE LENGTH: Not formally assessed  DTRs:  Patella: may benefit from testing Achilles: may benefit from testing  POSTURE: rounded shoulders, forward head, and posterior pelvic tilt  LOWER EXTREMITY ROM:     Active   WFL throughout B LEs Right Eval Left Eval  Hip flexion    Hip extension    Hip abduction    Hip adduction    Hip internal rotation    Hip external rotation    Knee flexion    Knee extension    Ankle dorsiflexion    Ankle plantarflexion    Ankle inversion    Ankle eversion     (Blank rows = not tested)  LOWER EXTREMITY MMT:    MMT Right Eval Left Eval  Hip flexion 5 5  Hip extension    Hip abduction    Hip adduction    Hip internal rotation    Hip external rotation    Knee flexion 5 5  Knee extension 5 5  Ankle dorsiflexion 4- 4-  Ankle plantarflexion 4- 4-  Ankle inversion    Ankle eversion    (Blank rows = not tested)  Manual Muscle Test Scale 0/5 = No muscle contraction can be seen or felt 1/5 = Contraction can be felt, but there is no motion 2-/5 = Part moves through incomplete ROM w/ gravity decreased 2/5 = Part moves through complete ROM w/ gravity decreased 2+/5 = Part moves through incomplete ROM (<50%) against gravity or through complete ROM w/ gravity 3-/5 = Part moves through incomplete ROM (>50%) against gravity  3/5 = Part moves through  complete ROM against gravity 3+/5 = Part moves through complete ROM against gravity/slight resistance 4-/5= Holds test position against slight to moderate pressure 4/5 = Part moves through complete ROM against gravity/moderate resistance 4+/5= Holds test position against moderate to strong pressure 5/5 = Part moves through complete ROM against gravity/full resistance  BED MOBILITY:  Findings: not formally assessed, but not reports of difficulty  TRANSFERS: Sit to stand: CGA  Assistive device utilized: None     Stand to sit: CGA  Assistive device utilized: None     Chair to chair: CGA  Assistive device utilized: None       RAMP:  Not tested, but would benefit from testing  CURB:  Not tested, but would benefit from testing  STAIRS: Need to assess GAIT: Findings: Gait Characteristics: excessive posterior lean throughout with his weight primarily through his heels, step through pattern, decreased step length- Right, decreased step length- Left, decreased stride length, and narrow BOS, Distance walked: ~124ft,  Assistive device utilized:None Level of assistance: CGA Comments: excessive heel strike with lateral bias and pt doesn't transfer weight through midfoot nor forefoot during stance, excessive posterior pelvic tilt throughout with compensatory excessive thoracic rounding to prevent posterior LOB  FUNCTIONAL TESTS:  6 minute walk test: need to assess 10 meter walk test: 0.737m/s Berg Balance Scale: 34/56 Functional gait assessment: need to assess  PATIENT SURVEYS:  ABC scale 56.25% with pt reporting primarily 50% confident with majority of the items including walking around the house - pt reporting he is unsteady with all standing tasks                                                                                                                              TREATMENT DATE: 03/02/2024  PT applied gait belt and provided CGA throughout session for improved safety and reduced  fall rise nless otherwise noted.  Nustep BUE/BLE x 6 min total level 1-5 with varied resistance. Cues to keep pressure through forefoot throughout endurance training.   In // bars: balance beam (airex beam) -lateral stepping 6x length with focus on equal weight shift onto midfoot -PVC pipe chest press 10x, overhead press 10x -tandem stance hold 30 seconds x2 trials each LE placement  Airex pad:  -Toe tap 4 hedghogs based on PT color guidance.   Heel toe rocker 10x each side   Half foam roller df/pf 20x with UE support  Half foam roller static stand without UE support 30 seconds   GTB  PF 15x each LE x2 sets Seated dynadisc: -pf/df 15x -circles clockwise 10x, counterclockwise 10x    PATIENT EDUCATION: Education details: Therapy POC, findings on evaluation today, performance of daily foot assessments, balance systems Pt educated throughout session about proper posture and technique with exercises. Improved exercise technique, movement at target joints, use of target muscles after min to mod verbal, visual, tactile cues.  Person educated: Patient Education method: Explanation Education  comprehension: verbalized understanding and needs further education  HOME EXERCISE PROGRAM:  Access Code: [email or web address] URL: [email or web address] Date: 02/10/2024 Prepared by: Aurora Lees  Exercises - Wide Stance with Counter Support  - 1 x daily - 7 x weekly - 3 sets - 30 seconds hold - Sit to Stand with Counter Support  - 1 x daily - 7 x weekly - 2 sets - 10 reps - Seated Ankle Plantar Flexion with Resistance Loop  - 1 x daily - 7 x weekly - 2 sets - 10 reps - Seated Ankle Eversion with Anchored Resistance  - 1 x daily - 7 x weekly - 2 sets - 10 reps - Standing Romberg to 1/2 Tandem Stance  - 1 x daily - 7 x weekly - 3 sets - 10 reps - 20 hold - Backward Walking with Counter Support  - 1 x daily - 7 x weekly - 3 sets - 10 reps - Side Lunge with Counter Support  - 1 x daily - 7 x  weekly - 3 sets - 10 reps   GOALS: Goals reviewed with patient? Yes  SHORT TERM GOALS: Target date: 02/22/2024  Patient will be independent in home exercise program to improve strength/mobility and balance for better functional independence with ADLs.  Baseline: need to initiate Goal status: INITIAL   LONG TERM GOALS: Target date: 04/04/2024  Patient will increase Berg Balance score to > 51/56 to demonstrate improved balance and decreased fall risk during functional activities and ADLs.  Baseline: 34/56 02/16/24: 40/56 Goal status: PROGRESSING  2.  Patient will increase Functional Gait Assessment (FGA) score to >20/30 as to reduce fall risk and improve dynamic gait safety with community ambulation.  Baseline: 5/30 due to pt frequently reaching for wall support from being unbalanced 02/16/24: 20/30 Goal status: PROGRESSING  3.  Patient will increase 10 meter walk test to >1.68m/s as to improve gait speed for better community ambulation and to reduce fall risk. Baseline: 0.751m/s without AD, CGA 02/16/24: 0.923 m/s without AD, CGA Goal status: PROGRESSING  4.  Patient will increase six minute walk test distance to >1000 for progression to community ambulator and improve gait ability  Baseline:  699 feet having to stop the test at 53min16sec due to fatigue (213 meters, Avg speed 0.17m/s) no AD, with CGA and pt frequently reaching for the wall to support his imbalance 02/16/24: 740' with no AD Goal status: PROGRESSING  5.  Patient will increase ABC scale score >80% to demonstrate better functional mobility and better confidence with ADLs.  Baseline: 56.25% 02/16/24: 70.63% Goal status: PROGRESSING   ASSESSMENT:  CLINICAL IMPRESSION:   Focus of today's session was on pressure through midfoot of patient. Multimodal cueing and use of physical cues assisted with performance. Seated movement of ankle is improving compared to standing. Unstable surfaces are challenging for patient at this time.    Pt will continue to benefit from skilled therapy to address remaining deficits in order to improve overall QoL and return to PLOF.      OBJECTIVE IMPAIRMENTS: Abnormal gait, decreased balance, decreased knowledge of condition, decreased knowledge of use of DME, decreased mobility, difficulty walking, decreased strength, impaired sensation, and improper body mechanics.   ACTIVITY LIMITATIONS: carrying, lifting, bending, standing, squatting, stairs, transfers, bathing, toileting, dressing, hygiene/grooming, and locomotion level  PARTICIPATION LIMITATIONS: meal prep, cleaning, laundry, shopping, community activity, occupation, yard work, and dancing  PERSONAL FACTORS: Age, Time since onset of injury/illness/exacerbation, and 3+ comorbidities: HTN, thyroid  disease, and diabetes are also affecting  patient's functional outcome.   REHAB POTENTIAL: Good  CLINICAL DECISION MAKING: Stable/uncomplicated  EVALUATION COMPLEXITY: Moderate  PLAN:  PT FREQUENCY: 1-2x/week  PT DURATION: 12 weeks  PLANNED INTERVENTIONS: 97164- PT Re-evaluation, 97750- Physical Performance Testing, 97110-Therapeutic exercises, 97530- Therapeutic activity, [id number]- Neuromuscular re-education, 97535- Self Care, 13244- Manual therapy, [id number]- Gait training, [phone]- Canalith repositioning, [phone]- Electrical stimulation (manual), Patient/Family education, Balance training, Stair training, Joint mobilization, Vestibular training, DME instructions, Cryotherapy, Moist heat, and Biofeedback   PLAN FOR NEXT SESSION:   - eyes closed as appropriate, daily walking, etc. - standing balance focusing on foot flat as pt tends to have posterior lean/wt shift into heels as well as narrow stance - gait training with improved foot rocker, rolling through toes during terminal stance/pre-swing -continue expanding ankle strengthening.   Jamarious Febo  Brain Cahill PT, DPT Physical Therapist - [facility]  Outpatient Physical  Therapy- Main [facility] [phone]    4:05 PM 03/02/24

## 2024-03-07 ENCOUNTER — Ambulatory Visit: Admitting: Physical Therapy

## 2024-03-07 DIAGNOSIS — R531 Weakness: Secondary | ICD-10-CM

## 2024-03-07 DIAGNOSIS — R2681 Unsteadiness on feet: Secondary | ICD-10-CM

## 2024-03-07 DIAGNOSIS — R269 Unspecified abnormalities of gait and mobility: Secondary | ICD-10-CM

## 2024-03-07 DIAGNOSIS — R262 Difficulty in walking, not elsewhere classified: Secondary | ICD-10-CM | POA: Diagnosis not present

## 2024-03-07 NOTE — Therapy (Unsigned)
 OUTPATIENT PHYSICAL THERAPY NEURO TREATMENT  Patient Name: Adam Castillo MRN: [id number] DOB:1945-10-16, 78 y.o., male Today's Date: 03/07/2024   PCP: Kristina Tinnie POUR, PA-C  REFERRING PROVIDER: Lane Arthea BRAVO, MD   END OF SESSION:   PT End of Session - 03/07/24 1532     Visit Number 16    Number of Visits 24    Date for PT Re-Evaluation 04/04/24    PT Start Time 1535    PT Stop Time 1615    PT Time Calculation (min) 40 min    Equipment Utilized During Treatment Gait belt    Activity Tolerance Patient tolerated treatment well    Behavior During Therapy WFL for tasks assessed/performed             Past Medical History:  Diagnosis Date   Diabetes mellitus without complication (HCC)    Hypertension    Thyroid  disease    Past Surgical History:  Procedure Laterality Date   COLONOSCOPY WITH PROPOFOL      COLONOSCOPY WITH PROPOFOL  N/A 10/15/2021   Procedure: COLONOSCOPY WITH PROPOFOL ;  Surgeon: Therisa Bi, MD;  Location: [facility] ENDOSCOPY;  Service: Gastroenterology;  Laterality: N/A;   HERNIA REPAIR     INSERTION OF MESH  08/27/2021   Procedure: INSERTION OF MESH;  Surgeon: Jordis Laneta FALCON, MD;  Location: [facility] ORS;  Service: General;;   Patient Active Problem List   Diagnosis Date Noted   Hernia of scrotum 11/02/2020   Unilateral recurrent inguinal hernia without obstruction or gangrene 11/02/2020   Stage 3a chronic kidney disease (HCC) 08/14/2020   Annual physical exam 08/02/2020   Abrasion of right hand 08/02/2020   Primary osteoarthritis of right knee 06/12/2020   Hypothyroidism due to Hashimoto's thyroiditis 02/10/2020   Dyslipidemia 02/10/2020   Essential hypertension 02/07/2020    ONSET DATE: Reports steady decline over past 2 years  REFERRING DIAG:  G62.9 (ICD-10-CM) - Polyneuropathy  R26.9 (ICD-10-CM) - Gait abnormality  R26.89 (ICD-10-CM) - Balance disorder    THERAPY DIAG:  Difficulty in walking  Abnormality of gait  Unsteadiness on  feet  Generalized weakness  Rationale for Evaluation and Treatment: Rehabilitation  SUBJECTIVE:                                                                                                                                                                                             SUBJECTIVE STATEMENT:  Patient reports no falls or LOB. No other updates,   From Initial Eval: Pt reports steady decline in his balance over the past 2 years after he retired. 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 Pt denies falls, but states he walks like a drunk man. Pt wearing nice dressier shoes today. Pt reports increased difficulty with any household tasks that requires him to stand, due to his balance, but primarily is disappointed he can no longer dance.   Reports it feels like he is walking on sponges from where his feet are numb. Pt states standing in 1 place causes his feet to be more numb.  Pt accompanied by: self  PERTINENT HISTORY: Pt reports hx of hernial operation and prior R hand/wrist fx with no residual deficits, but otherwise healthy PMH: HTN, thyroid  disease, and diabetes  PAIN:  Are you having pain? No  PRECAUTIONS: Fall  RED FLAGS: None   WEIGHT BEARING RESTRICTIONS: No  FALLS: Has patient fallen in last 6 months? No and states he walks like a drunk and stumbles a lot  LIVING ENVIRONMENT: Lives with: lives alone Lives in: House/apartment Stairs: Yes: External: 3 steps; none or can enter through the garage with 1 step-up to enter Has following equipment at home: built in shower seat  PLOF: Independent with basic ADLs, Independent with homemaking with ambulation, and Independent with gait  PATIENT GOALS: prevent his balance from getting worse and ideally go back to work and return to dancing  OBJECTIVE:  Note: Objective measures were completed at Evaluation unless otherwise noted.  DIAGNOSTIC FINDINGS:   EMG Impression: Abnormal study. 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  Electronically signed by Lane Arthea Locus, MD at 07/24/2023 12:08 PM EST   COGNITION: Overall cognitive status: Within functional limits for tasks assessed   SENSATION: Light touch: Impaired bilaterally with R LE more impaired than L Proprioception: not formally assessed, but appears impaired during functional mobility  COORDINATION: WFL heel to shin  EDEMA:  None observed on eval  MUSCLE TONE: appears WNL  MUSCLE LENGTH: Not formally assessed  DTRs:  Patella: may benefit from testing Achilles: may benefit from testing  POSTURE: rounded shoulders, forward head, and posterior pelvic tilt  LOWER EXTREMITY ROM:     Active   WFL throughout B LEs Right Eval Left Eval  Hip flexion    Hip extension    Hip abduction    Hip adduction    Hip internal rotation    Hip external rotation    Knee flexion    Knee extension    Ankle dorsiflexion    Ankle plantarflexion    Ankle inversion    Ankle eversion     (Blank rows = not tested)  LOWER EXTREMITY MMT:    MMT Right Eval Left Eval  Hip flexion 5 5  Hip extension    Hip abduction    Hip adduction    Hip internal rotation    Hip external rotation    Knee flexion 5 5  Knee extension 5 5  Ankle dorsiflexion 4- 4-  Ankle plantarflexion 4- 4-  Ankle inversion    Ankle eversion    (Blank rows = not tested)  Manual Muscle Test Scale 0/5 = No muscle contraction can be seen or felt 1/5 = Contraction can be felt, but there is no motion 2-/5 = Part moves through incomplete ROM w/ gravity decreased 2/5 = Part moves through complete ROM w/ gravity decreased 2+/5 = Part moves through incomplete ROM (<50%) against gravity or through complete ROM w/ gravity 3-/5 = Part moves through incomplete ROM (>50%) against gravity 3/5 = Part moves through  complete ROM against  gravity 3+/5 = Part moves through complete ROM against gravity/slight resistance 4-/5= Holds test position against slight to moderate pressure 4/5 = Part moves through complete ROM against gravity/moderate resistance 4+/5= Holds test position against moderate to strong pressure 5/5 = Part moves through complete ROM against gravity/full resistance  BED MOBILITY:  Findings: not formally assessed, but not reports of difficulty  TRANSFERS: Sit to stand: CGA  Assistive device utilized: None     Stand to sit: CGA  Assistive device utilized: None     Chair to chair: CGA  Assistive device utilized: None       RAMP:  Not tested, but would benefit from testing  CURB:  Not tested, but would benefit from testing  STAIRS: Need to assess GAIT: Findings: Gait Characteristics: excessive posterior lean throughout with his weight primarily through his heels, step through pattern, decreased step length- Right, decreased step length- Left, decreased stride length, and narrow BOS, Distance walked: ~160ft,  Assistive device utilized:None Level of assistance: CGA Comments: excessive heel strike with lateral bias and pt doesn't transfer weight through midfoot nor forefoot during stance, excessive posterior pelvic tilt throughout with compensatory excessive thoracic rounding to prevent posterior LOB  FUNCTIONAL TESTS:  6 minute walk test: need to assess 10 meter walk test: 0.731m/s Berg Balance Scale: 34/56 Functional gait assessment: need to assess  PATIENT SURVEYS:  ABC scale 56.25% with pt reporting primarily 50% confident with majority of the items including walking around the house - pt reporting he is unsteady with all standing tasks                                                                                                                              TREATMENT DATE: 03/07/2024  PT applied gait belt and provided CGA throughout session for improved safety and reduced fall rise nless  otherwise noted.  Nustep BUE/BLE x 6 min total level 1-6 with varied resistance. Cues to keep pressure through forefoot throughout endurance training. Heel positioned on edge of foot support to maintain sligt ankle PF.   Seated:  Ankle PF GTB 2 x 15 Ankle eversion RTB 2 x 15  Seated heel raise kettle bell 2x 15  Sit<>stand holding kettle bell 2 x 10  Rocker board AP with 3 sec each; 2x 10   Standing hip extension, x 12 Standing hip abduction x 12  Cues for posture and reduced trunkal compensation to increase gluteal activation.   Reverse gait on treadmill 2 x with lower back supported on hand rail. Min assist from PT to maintain forward progression of treadmill belt  cues for improved forefoot contact and step length.   PATIENT EDUCATION: Education details: Therapy POC, findings on evaluation today, performance of daily foot assessments, balance systems Pt educated throughout session about proper posture and technique with exercises. Improved exercise technique, movement at target joints, use of target muscles after min to mod verbal, visual, tactile cues.  Person educated: Patient Education method: Explanation Education comprehension: verbalized understanding and needs further education  HOME EXERCISE PROGRAM:  Access Code: [email or web address] URL: [email or web address] Date: 02/10/2024 Prepared by: Massie Dollar  Exercises - Wide Stance with Counter Support  - 1 x daily - 7 x weekly - 3 sets - 30 seconds hold - Sit to Stand with Counter Support  - 1 x daily - 7 x weekly - 2 sets - 10 reps - Seated Ankle Plantar Flexion with Resistance Loop  - 1 x daily - 7 x weekly - 2 sets - 10 reps - Seated Ankle Eversion with Anchored Resistance  - 1 x daily - 7 x weekly - 2 sets - 10 reps - Standing Romberg to 1/2 Tandem Stance  - 1 x daily - 7 x weekly - 3 sets - 10 reps - 20 hold - Backward Walking with Counter Support  - 1 x daily - 7 x weekly - 3 sets - 10 reps - Side Lunge  with Counter Support  - 1 x daily - 7 x weekly - 3 sets - 10 reps   GOALS: Goals reviewed with patient? Yes  SHORT TERM GOALS: Target date: 02/22/2024  Patient will be independent in home exercise program to improve strength/mobility and balance for better functional independence with ADLs.  Baseline: need to initiate Goal status: INITIAL   LONG TERM GOALS: Target date: 04/04/2024  Patient will increase Berg Balance score to > 51/56 to demonstrate improved balance and decreased fall risk during functional activities and ADLs.  Baseline: 34/56 02/16/24: 40/56 Goal status: PROGRESSING  2.  Patient will increase Functional Gait Assessment (FGA) score to >20/30 as to reduce fall risk and improve dynamic gait safety with community ambulation.  Baseline: 5/30 due to pt frequently reaching for wall support from being unbalanced 02/16/24: 20/30 Goal status: PROGRESSING  3.  Patient will increase 10 meter walk test to >1.52m/s as to improve gait speed for better community ambulation and to reduce fall risk. Baseline: 0.777m/s without AD, CGA 02/16/24: 0.923 m/s without AD, CGA Goal status: PROGRESSING  4.  Patient will increase six minute walk test distance to >1000 for progression to community ambulator and improve gait ability  Baseline:  699 feet having to stop the test at 69min16sec due to fatigue (213 meters, Avg speed 0.69m/s) no AD, with CGA and pt frequently reaching for the wall to support his imbalance 02/16/24: 740' with no AD Goal status: PROGRESSING  5.  Patient will increase ABC scale score >80% to demonstrate better functional mobility and better confidence with ADLs.  Baseline: 56.25% 02/16/24: 70.63% Goal status: PROGRESSING   ASSESSMENT:  CLINICAL IMPRESSION:   Focus of today's session was on pressure through midfoot of patient. Multimodal cueing and use of physical cues assisted with performance. Treatment progressed with increased resistance or repetitions to improved muscle  activation and ROM. Seated movement of ankle is improving compared to standing due to sensory deficits and fear of WB through forefoot.  Pt will continue to benefit from skilled therapy to address remaining deficits in order to improve overall QoL and return to PLOF.      OBJECTIVE IMPAIRMENTS: Abnormal gait, decreased balance, decreased knowledge of condition, decreased knowledge of use of DME, decreased mobility, difficulty walking, decreased strength, impaired sensation, and improper body mechanics.   ACTIVITY LIMITATIONS: carrying, lifting, bending, standing, squatting, stairs, transfers, bathing, toileting, dressing, hygiene/grooming, and locomotion level  PARTICIPATION LIMITATIONS: meal prep, cleaning, laundry, shopping, community activity, occupation, yard work, and dancing  PERSONAL FACTORS: Age, Time since onset of injury/illness/exacerbation, and 3+ comorbidities: HTN, thyroid  disease, and diabetes are also affecting patient's functional outcome.   REHAB POTENTIAL: Good  CLINICAL DECISION MAKING: Stable/uncomplicated  EVALUATION COMPLEXITY: Moderate  PLAN:  PT FREQUENCY: 1-2x/week  PT DURATION: 12 weeks  PLANNED INTERVENTIONS: 97164- PT Re-evaluation, 97750- Physical Performance Testing, 97110-Therapeutic exercises, 97530- Therapeutic activity, [id number]- Neuromuscular re-education, 97535- Self Care, [id number]- Manual therapy, [id number]- Gait training, [phone]- Canalith repositioning, [id number]- Electrical stimulation (manual), Patient/Family education, Balance training, Stair training, Joint mobilization, Vestibular training, DME instructions, Cryotherapy, Moist heat, and Biofeedback   PLAN FOR NEXT SESSION:   - eyes closed as appropriate, daily walking, etc. - standing balance focusing on foot flat as pt tends to have posterior lean/wt shift into heels as well as narrow stance - gait training with improved foot rocker, rolling through toes during terminal stance/pre-swing -continue expanding  ankle strengthening.   Massie Dollar PT, DPT  Physical Therapist - [facility]  8:25 AM 03/08/24

## 2024-03-09 ENCOUNTER — Ambulatory Visit: Admitting: Physical Therapy

## 2024-03-09 DIAGNOSIS — R269 Unspecified abnormalities of gait and mobility: Secondary | ICD-10-CM

## 2024-03-09 DIAGNOSIS — R531 Weakness: Secondary | ICD-10-CM

## 2024-03-09 DIAGNOSIS — R2681 Unsteadiness on feet: Secondary | ICD-10-CM

## 2024-03-09 DIAGNOSIS — R262 Difficulty in walking, not elsewhere classified: Secondary | ICD-10-CM

## 2024-03-09 NOTE — Therapy (Unsigned)
 OUTPATIENT PHYSICAL THERAPY NEURO TREATMENT  Patient Name: Adam Castillo MRN: [id number] DOB:1946/03/03, 78 y.o., male Today's Date: 03/09/2024   PCP: Kristina Tinnie POUR, PA-C  REFERRING PROVIDER: Lane Arthea BRAVO, MD   END OF SESSION:   PT End of Session - 03/09/24 1543     Visit Number 17    Number of Visits 24    Date for PT Re-Evaluation 04/04/24    PT Start Time 1535    PT Stop Time 1615    PT Time Calculation (min) 40 min    Equipment Utilized During Treatment Gait belt    Activity Tolerance Patient tolerated treatment well    Behavior During Therapy WFL for tasks assessed/performed             Past Medical History:  Diagnosis Date   Diabetes mellitus without complication (HCC)    Hypertension    Thyroid  disease    Past Surgical History:  Procedure Laterality Date   COLONOSCOPY WITH PROPOFOL      COLONOSCOPY WITH PROPOFOL  N/A 10/15/2021   Procedure: COLONOSCOPY WITH PROPOFOL ;  Surgeon: Therisa Bi, MD;  Location: [facility] ENDOSCOPY;  Service: Gastroenterology;  Laterality: N/A;   HERNIA REPAIR     INSERTION OF MESH  08/27/2021   Procedure: INSERTION OF MESH;  Surgeon: Jordis Laneta FALCON, MD;  Location: [facility] ORS;  Service: General;;   Patient Active Problem List   Diagnosis Date Noted   Hernia of scrotum 11/02/2020   Unilateral recurrent inguinal hernia without obstruction or gangrene 11/02/2020   Stage 3a chronic kidney disease (HCC) 08/14/2020   Annual physical exam 08/02/2020   Abrasion of right hand 08/02/2020   Primary osteoarthritis of right knee 06/12/2020   Hypothyroidism due to Hashimoto's thyroiditis 02/10/2020   Dyslipidemia 02/10/2020   Essential hypertension 02/07/2020    ONSET DATE: Reports steady decline over past 2 years  REFERRING DIAG:  G62.9 (ICD-10-CM) - Polyneuropathy  R26.9 (ICD-10-CM) - Gait abnormality  R26.89 (ICD-10-CM) - Balance disorder    THERAPY DIAG:  Difficulty in walking  Generalized weakness  Abnormality of  gait  Unsteadiness on feet  Rationale for Evaluation and Treatment: Rehabilitation  SUBJECTIVE:                                                                                                                                                                                             SUBJECTIVE STATEMENT:  Patient reports that he is doing well. No updates. No pain. No falls.   From Initial Eval: Pt reports steady decline in his balance over the past 2 years after he retired. 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 Pt denies falls, but states he walks like a drunk man. Pt wearing nice dressier shoes today. Pt reports increased difficulty with any household tasks that requires him to stand, due to his balance, but primarily is disappointed he can no longer dance.   Reports it feels like he is walking on sponges from where his feet are numb. Pt states standing in 1 place causes his feet to be more numb.  Pt accompanied by: self  PERTINENT HISTORY: Pt reports hx of hernial operation and prior R hand/wrist fx with no residual deficits, but otherwise healthy PMH: HTN, thyroid  disease, and diabetes  PAIN:  Are you having pain? No  PRECAUTIONS: Fall  RED FLAGS: None   WEIGHT BEARING RESTRICTIONS: No  FALLS: Has patient fallen in last 6 months? No and states he walks like a drunk and stumbles a lot  LIVING ENVIRONMENT: Lives with: lives alone Lives in: House/apartment Stairs: Yes: External: 3 steps; none or can enter through the garage with 1 step-up to enter Has following equipment at home: built in shower seat  PLOF: Independent with basic ADLs, Independent with homemaking with ambulation, and Independent with gait  PATIENT GOALS: prevent his balance from getting worse and ideally go back to work and return to dancing  OBJECTIVE:  Note: Objective measures were completed at Evaluation unless otherwise  noted.  DIAGNOSTIC FINDINGS:  EMG Impression: Abnormal study. 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  Electronically signed by Lane Arthea Locus, MD at 07/24/2023 12:08 PM EST   COGNITION: Overall cognitive status: Within functional limits for tasks assessed   SENSATION: Light touch: Impaired bilaterally with R LE more impaired than L Proprioception: not formally assessed, but appears impaired during functional mobility  COORDINATION: WFL heel to shin  EDEMA:  None observed on eval  MUSCLE TONE: appears WNL  MUSCLE LENGTH: Not formally assessed  DTRs:  Patella: may benefit from testing Achilles: may benefit from testing  POSTURE: rounded shoulders, forward head, and posterior pelvic tilt  LOWER EXTREMITY ROM:     Active   WFL throughout B LEs Right Eval Left Eval  Hip flexion    Hip extension    Hip abduction    Hip adduction    Hip internal rotation    Hip external rotation    Knee flexion    Knee extension    Ankle dorsiflexion    Ankle plantarflexion    Ankle inversion    Ankle eversion     (Blank rows = not tested)  LOWER EXTREMITY MMT:    MMT Right Eval Left Eval  Hip flexion 5 5  Hip extension    Hip abduction    Hip adduction    Hip internal rotation    Hip external rotation    Knee flexion 5 5  Knee extension 5 5  Ankle dorsiflexion 4- 4-  Ankle plantarflexion 4- 4-  Ankle inversion    Ankle eversion    (Blank rows = not tested)  Manual Muscle Test Scale 0/5 = No muscle contraction can be seen or felt 1/5 = Contraction can be felt, but there is no motion 2-/5 = Part moves through incomplete ROM w/ gravity decreased 2/5 = Part moves through complete ROM w/ gravity decreased 2+/5 = Part moves through incomplete ROM (<50%) against gravity or through complete ROM w/ gravity 3-/5 = Part moves through incomplete ROM (>50%) against gravity 3/5 =  Part moves through  complete ROM against gravity 3+/5 = Part moves through complete ROM against gravity/slight resistance 4-/5= Holds test position against slight to moderate pressure 4/5 = Part moves through complete ROM against gravity/moderate resistance 4+/5= Holds test position against moderate to strong pressure 5/5 = Part moves through complete ROM against gravity/full resistance  BED MOBILITY:  Findings: not formally assessed, but not reports of difficulty  TRANSFERS: Sit to stand: CGA  Assistive device utilized: None     Stand to sit: CGA  Assistive device utilized: None     Chair to chair: CGA  Assistive device utilized: None       RAMP:  Not tested, but would benefit from testing  CURB:  Not tested, but would benefit from testing  STAIRS: Need to assess GAIT: Findings: Gait Characteristics: excessive posterior lean throughout with his weight primarily through his heels, step through pattern, decreased step length- Right, decreased step length- Left, decreased stride length, and narrow BOS, Distance walked: ~163ft,  Assistive device utilized:None Level of assistance: CGA Comments: excessive heel strike with lateral bias and pt doesn't transfer weight through midfoot nor forefoot during stance, excessive posterior pelvic tilt throughout with compensatory excessive thoracic rounding to prevent posterior LOB  FUNCTIONAL TESTS:  6 minute walk test: need to assess 10 meter walk test: 0.745m/s Berg Balance Scale: 34/56 Functional gait assessment: need to assess  PATIENT SURVEYS:  ABC scale 56.25% with pt reporting primarily 50% confident with majority of the items including walking around the house - pt reporting he is unsteady with all standing tasks                                                                                                                              TREATMENT DATE: 03/09/2024  PT applied gait belt and provided CGA throughout session for improved safety and reduced  fall rise nless otherwise noted.  Nustep BUE/BLE x 5 min total level 1-6 with varied resistance. Cues to keep pressure through forefoot throughout endurance training. Heel positioned on edge of foot support to maintain sligt ankle PF. Improved ability to maintain forefoot contact on pedal   Standing on airex pad:  Normal BOS 2 x 30 sec.  Narrow BOS 2 x30 sec  Standing with 1 foot on 6 inch step 2 x 30 sec  Reciprocal forward foot tap on 6 inch step x 12 bil  Lateral foot tap x 12 on 6 inch step   Weighted gait training with 3# AW x 354ft cues for improved hip extension to force WB through forefoot.  Side stepping at rail with light UE support 4 ft x 5  Forward/reverse gait at rail x 6.  Sit<>stand 2 x 10 without UE support   PATIENT EDUCATION: Education details: Therapy POC, findings on evaluation today, performance of daily foot assessments, balance systems Pt educated throughout session about proper posture and technique with exercises. Improved exercise technique, movement at target joints, use  of target muscles after min to mod verbal, visual, tactile cues.  Person educated: Patient Education method: Explanation Education comprehension: verbalized understanding and needs further education  HOME EXERCISE PROGRAM:  Access Code: [email or web address] URL: [email or web address] Date: 02/10/2024 Prepared by: Massie Dollar  Exercises - Wide Stance with Counter Support  - 1 x daily - 7 x weekly - 3 sets - 30 seconds hold - Sit to Stand with Counter Support  - 1 x daily - 7 x weekly - 2 sets - 10 reps - Seated Ankle Plantar Flexion with Resistance Loop  - 1 x daily - 7 x weekly - 2 sets - 10 reps - Seated Ankle Eversion with Anchored Resistance  - 1 x daily - 7 x weekly - 2 sets - 10 reps - Standing Romberg to 1/2 Tandem Stance  - 1 x daily - 7 x weekly - 3 sets - 10 reps - 20 hold - Backward Walking with Counter Support  - 1 x daily - 7 x weekly - 3 sets - 10 reps - Side Lunge  with Counter Support  - 1 x daily - 7 x weekly - 3 sets - 10 reps   GOALS: Goals reviewed with patient? Yes  SHORT TERM GOALS: Target date: 02/22/2024  Patient will be independent in home exercise program to improve strength/mobility and balance for better functional independence with ADLs.  Baseline: need to initiate Goal status: INITIAL   LONG TERM GOALS: Target date: 04/04/2024  Patient will increase Berg Balance score to > 51/56 to demonstrate improved balance and decreased fall risk during functional activities and ADLs.  Baseline: 34/56 02/16/24: 40/56 Goal status: PROGRESSING  2.  Patient will increase Functional Gait Assessment (FGA) score to >20/30 as to reduce fall risk and improve dynamic gait safety with community ambulation.  Baseline: 5/30 due to pt frequently reaching for wall support from being unbalanced 02/16/24: 20/30 Goal status: PROGRESSING  3.  Patient will increase 10 meter walk test to >1.54m/s as to improve gait speed for better community ambulation and to reduce fall risk. Baseline: 0.772m/s without AD, CGA 02/16/24: 0.923 m/s without AD, CGA Goal status: PROGRESSING  4.  Patient will increase six minute walk test distance to >1000 for progression to community ambulator and improve gait ability  Baseline:  699 feet having to stop the test at 66min16sec due to fatigue (213 meters, Avg speed 0.59m/s) no AD, with CGA and pt frequently reaching for the wall to support his imbalance 02/16/24: 740' with no AD Goal status: PROGRESSING  5.  Patient will increase ABC scale score >80% to demonstrate better functional mobility and better confidence with ADLs.  Baseline: 56.25% 02/16/24: 70.63% Goal status: PROGRESSING   ASSESSMENT:  CLINICAL IMPRESSION:   Focus of today's session was improved use of ankle strategy for righting reactions on unlevel surface as well as normalized movement pattern with variable gait training. Pt demonstrated improved step length and WB  through forefoot on unlevel surface with encouragement and instruction from PT.   Pt will continue to benefit from skilled therapy to address remaining deficits in order to improve overall QoL and return to PLOF.      OBJECTIVE IMPAIRMENTS: Abnormal gait, decreased balance, decreased knowledge of condition, decreased knowledge of use of DME, decreased mobility, difficulty walking, decreased strength, impaired sensation, and improper body mechanics.   ACTIVITY LIMITATIONS: carrying, lifting, bending, standing, squatting, stairs, transfers, bathing, toileting, dressing, hygiene/grooming, and locomotion level  PARTICIPATION LIMITATIONS: meal prep, cleaning, laundry, shopping, community activity, occupation,  yard work, and dancing  PERSONAL FACTORS: Age, Time since onset of injury/illness/exacerbation, and 3+ comorbidities: HTN, thyroid  disease, and diabetes are also affecting patient's functional outcome.   REHAB POTENTIAL: Good  CLINICAL DECISION MAKING: Stable/uncomplicated  EVALUATION COMPLEXITY: Moderate  PLAN:  PT FREQUENCY: 1-2x/week  PT DURATION: 12 weeks  PLANNED INTERVENTIONS: 97164- PT Re-evaluation, 97750- Physical Performance Testing, 97110-Therapeutic exercises, 97530- Therapeutic activity, [id number]- Neuromuscular re-education, 97535- Self Care, [id number]- Manual therapy, [id number]- Gait training, [phone]- Canalith repositioning, [phone]- Electrical stimulation (manual), Patient/Family education, Balance training, Stair training, Joint mobilization, Vestibular training, DME instructions, Cryotherapy, Moist heat, and Biofeedback   PLAN FOR NEXT SESSION:   - eyes closed as appropriate, daily walking, etc. - standing balance focusing on foot flat as pt tends to have posterior lean/wt shift into heels as well as narrow stance - gait training with improved foot rocker, rolling through toes during terminal stance/pre-swing -continue expanding ankle strengthening.   Massie Dollar PT, DPT   Physical Therapist - [place]  [facility]  3:44 PM 03/09/24

## 2024-03-14 ENCOUNTER — Ambulatory Visit: Admitting: Physical Therapy

## 2024-03-14 DIAGNOSIS — R2681 Unsteadiness on feet: Secondary | ICD-10-CM

## 2024-03-14 DIAGNOSIS — R262 Difficulty in walking, not elsewhere classified: Secondary | ICD-10-CM

## 2024-03-14 DIAGNOSIS — R531 Weakness: Secondary | ICD-10-CM

## 2024-03-14 DIAGNOSIS — R269 Unspecified abnormalities of gait and mobility: Secondary | ICD-10-CM

## 2024-03-14 NOTE — Therapy (Signed)
 OUTPATIENT PHYSICAL THERAPY NEURO TREATMENT  Patient Name: Adam Castillo MRN: [id number] DOB:December 11, 1945, 78 y.o., male Today's Date: 03/14/2024   PCP: Kristina Tinnie POUR, PA-C  REFERRING PROVIDER: Lane Arthea BRAVO, MD   END OF SESSION:   PT End of Session - 03/14/24 1105     Visit Number 18    Number of Visits 24    Date for PT Re-Evaluation 04/04/24    PT Start Time 1105    PT Stop Time 1145    PT Time Calculation (min) 40 min    Equipment Utilized During Treatment Gait belt    Activity Tolerance Patient tolerated treatment well    Behavior During Therapy WFL for tasks assessed/performed             Past Medical History:  Diagnosis Date   Diabetes mellitus without complication (HCC)    Hypertension    Thyroid  disease    Past Surgical History:  Procedure Laterality Date   COLONOSCOPY WITH PROPOFOL      COLONOSCOPY WITH PROPOFOL  N/A 10/15/2021   Procedure: COLONOSCOPY WITH PROPOFOL ;  Surgeon: Therisa Bi, MD;  Location: [facility] ENDOSCOPY;  Service: Gastroenterology;  Laterality: N/A;   HERNIA REPAIR     INSERTION OF MESH  08/27/2021   Procedure: INSERTION OF MESH;  Surgeon: Jordis Laneta FALCON, MD;  Location: [facility] ORS;  Service: General;;   Patient Active Problem List   Diagnosis Date Noted   Hernia of scrotum 11/02/2020   Unilateral recurrent inguinal hernia without obstruction or gangrene 11/02/2020   Stage 3a chronic kidney disease (HCC) 08/14/2020   Annual physical exam 08/02/2020   Abrasion of right hand 08/02/2020   Primary osteoarthritis of right knee 06/12/2020   Hypothyroidism due to Hashimoto's thyroiditis 02/10/2020   Dyslipidemia 02/10/2020   Essential hypertension 02/07/2020    ONSET DATE: Reports steady decline over past 2 years  REFERRING DIAG:  G62.9 (ICD-10-CM) - Polyneuropathy  R26.9 (ICD-10-CM) - Gait abnormality  R26.89 (ICD-10-CM) - Balance disorder    THERAPY DIAG:  Difficulty in walking  Generalized weakness  Abnormality of  gait  Unsteadiness on feet  Rationale for Evaluation and Treatment: Rehabilitation  SUBJECTIVE:                                                                                                                                                                                             SUBJECTIVE STATEMENT:  Patient reports that he is doing well. No pain reported by pt. States that he had a quiet weekend.  Will have follow up with Neurology in early July.    From Initial Eval: Pt reports steady decline in his  balance over the past 2 years after he retired. 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 Pt denies falls, but states he walks like a drunk man. Pt wearing nice dressier shoes today. Pt reports increased difficulty with any household tasks that requires him to stand, due to his balance, but primarily is disappointed he can no longer dance.   Reports it feels like he is walking on sponges from where his feet are numb. Pt states standing in 1 place causes his feet to be more numb.  Pt accompanied by: self  PERTINENT HISTORY: Pt reports hx of hernial operation and prior R hand/wrist fx with no residual deficits, but otherwise healthy PMH: HTN, thyroid  disease, and diabetes  PAIN:  Are you having pain? No  PRECAUTIONS: Fall  RED FLAGS: None   WEIGHT BEARING RESTRICTIONS: No  FALLS: Has patient fallen in last 6 months? No and states he walks like a drunk and stumbles a lot  LIVING ENVIRONMENT: Lives with: lives alone Lives in: House/apartment Stairs: Yes: External: 3 steps; none or can enter through the garage with 1 step-up to enter Has following equipment at home: built in shower seat  PLOF: Independent with basic ADLs, Independent with homemaking with ambulation, and Independent with gait  PATIENT GOALS: prevent his balance from getting worse and ideally go back to work and return to dancing  OBJECTIVE:   Note: Objective measures were completed at Evaluation unless otherwise noted.  DIAGNOSTIC FINDINGS:  EMG Impression: Abnormal study. 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  Electronically signed by Lane Arthea Locus, MD at 07/24/2023 12:08 PM EST   COGNITION: Overall cognitive status: Within functional limits for tasks assessed   SENSATION: Light touch: Impaired bilaterally with R LE more impaired than L Proprioception: not formally assessed, but appears impaired during functional mobility  COORDINATION: WFL heel to shin  EDEMA:  None observed on eval  MUSCLE TONE: appears WNL  MUSCLE LENGTH: Not formally assessed  DTRs:  Patella: may benefit from testing Achilles: may benefit from testing  POSTURE: rounded shoulders, forward head, and posterior pelvic tilt  LOWER EXTREMITY ROM:     Active   WFL throughout B LEs Right Eval Left Eval  Hip flexion    Hip extension    Hip abduction    Hip adduction    Hip internal rotation    Hip external rotation    Knee flexion    Knee extension    Ankle dorsiflexion    Ankle plantarflexion    Ankle inversion    Ankle eversion     (Blank rows = not tested)  LOWER EXTREMITY MMT:    MMT Right Eval Left Eval  Hip flexion 5 5  Hip extension    Hip abduction    Hip adduction    Hip internal rotation    Hip external rotation    Knee flexion 5 5  Knee extension 5 5  Ankle dorsiflexion 4- 4-  Ankle plantarflexion 4- 4-  Ankle inversion    Ankle eversion    (Blank rows = not tested)  Manual Muscle Test Scale 0/5 = No muscle contraction can be seen or felt 1/5 = Contraction can be felt, but there is no motion 2-/5 = Part moves through incomplete ROM w/ gravity decreased 2/5 = Part moves through complete ROM w/ gravity decreased 2+/5 = Part moves through incomplete ROM (<50%) against gravity  or through complete ROM w/ gravity 3-/5 = Part  moves through incomplete ROM (>50%) against gravity 3/5 = Part moves through complete ROM against gravity 3+/5 = Part moves through complete ROM against gravity/slight resistance 4-/5= Holds test position against slight to moderate pressure 4/5 = Part moves through complete ROM against gravity/moderate resistance 4+/5= Holds test position against moderate to strong pressure 5/5 = Part moves through complete ROM against gravity/full resistance  BED MOBILITY:  Findings: not formally assessed, but not reports of difficulty  TRANSFERS: Sit to stand: CGA  Assistive device utilized: None     Stand to sit: CGA  Assistive device utilized: None     Chair to chair: CGA  Assistive device utilized: None       RAMP:  Not tested, but would benefit from testing  CURB:  Not tested, but would benefit from testing  STAIRS: Need to assess GAIT: Findings: Gait Characteristics: excessive posterior lean throughout with his weight primarily through his heels, step through pattern, decreased step length- Right, decreased step length- Left, decreased stride length, and narrow BOS, Distance walked: ~164ft,  Assistive device utilized:None Level of assistance: CGA Comments: excessive heel strike with lateral bias and pt doesn't transfer weight through midfoot nor forefoot during stance, excessive posterior pelvic tilt throughout with compensatory excessive thoracic rounding to prevent posterior LOB  FUNCTIONAL TESTS:  6 minute walk test: need to assess 10 meter walk test: 0.730m/s Berg Balance Scale: 34/56 Functional gait assessment: need to assess  PATIENT SURVEYS:  ABC scale 56.25% with pt reporting primarily 50% confident with majority of the items including walking around the house - pt reporting he is unsteady with all standing tasks                                                                                                                              TREATMENT DATE: 03/14/2024  PT applied  gait belt and provided CGA throughout session for improved safety and reduced fall rise nless otherwise noted.  Gait without UE support x 380ft. Cues for improved hip extension, but only mild improvement noted in gait pattern.   Seated  heel raise from 1/2 bolster 2 x 15 Toe raise with heels on 1/2 bolster x 15   Standing at rail  Standing hip extension with RTB at ankles x 10 bil  Squat without UE support with RTB at knees Standing hip abduction x 10 bil  Standing on down hill wedge 2 x 30 sec with increased angle Standing on burgundy  wedge uphill x 30 sec, down hill x 30 sec  Standing calf raise with knees blocked 2 x 12; able to achieve slight heel clearance on this day.   Nustep BUE/BLE x 5 min total level 1-6 with varied resistance. Cues to keep pressure through forefoot throughout endurance training. Heel positioned on edge of foot support to maintain sligt ankle PF. Improved ability to maintain forefoot contact on pedal   Standing  on airex pad:  Normal BOS 2 x 30 sec.  Narrow BOS 2 x30 sec  Standing with 1 foot on 6 inch step 2 x 30 sec  Reciprocal forward foot tap on 6 inch step x 12 bil  Lateral foot tap x 12 on 6 inch step   Weighted gait training with 3# AW x 335ft cues for improved hip extension to force WB through forefoot.  Side stepping at rail with light UE support 4 ft x 5  Forward/reverse gait at rail x 6.  Sit<>stand 2 x 10 without UE support   Weighted chair sled push 4 x 42ft with 30# cues for WB through forefoot with improve toe push off with each repetition.    Additional gait without AD and cues for toe push off as tolerated with only mild improvement noted.   PATIENT EDUCATION: Education details: Therapy POC, findings on evaluation today, performance of daily foot assessments, balance systems Pt educated throughout session about proper posture and technique with exercises. Improved exercise technique, movement at target joints, use of target muscles after  min to mod verbal, visual, tactile cues. Possible correlation of neuropathy and hx of Lumbar disc disease.   Person educated: Patient Education method: Explanation Education comprehension: verbalized understanding and needs further education  HOME EXERCISE PROGRAM:  Access Code: [email or web address] URL: [email or web address] Date: 02/10/2024 Prepared by: Massie Dollar  Exercises - Wide Stance with Counter Support  - 1 x daily - 7 x weekly - 3 sets - 30 seconds hold - Sit to Stand with Counter Support  - 1 x daily - 7 x weekly - 2 sets - 10 reps - Seated Ankle Plantar Flexion with Resistance Loop  - 1 x daily - 7 x weekly - 2 sets - 10 reps - Seated Ankle Eversion with Anchored Resistance  - 1 x daily - 7 x weekly - 2 sets - 10 reps - Standing Romberg to 1/2 Tandem Stance  - 1 x daily - 7 x weekly - 3 sets - 10 reps - 20 hold - Backward Walking with Counter Support  - 1 x daily - 7 x weekly - 3 sets - 10 reps - Side Lunge with Counter Support  - 1 x daily - 7 x weekly - 3 sets - 10 reps   GOALS: Goals reviewed with patient? Yes  SHORT TERM GOALS: Target date: 02/22/2024  Patient will be independent in home exercise program to improve strength/mobility and balance for better functional independence with ADLs.  Baseline: need to initiate Goal status: INITIAL   LONG TERM GOALS: Target date: 04/04/2024  Patient will increase Berg Balance score to > 51/56 to demonstrate improved balance and decreased fall risk during functional activities and ADLs.  Baseline: 34/56 02/16/24: 40/56 Goal status: PROGRESSING  2.  Patient will increase Functional Gait Assessment (FGA) score to >20/30 as to reduce fall risk and improve dynamic gait safety with community ambulation.  Baseline: 5/30 due to pt frequently reaching for wall support from being unbalanced 02/16/24: 20/30 Goal status: PROGRESSING  3.  Patient will increase 10 meter walk test to >1.90m/s as to improve gait speed for better  community ambulation and to reduce fall risk. Baseline: 0.750m/s without AD, CGA 02/16/24: 0.923 m/s without AD, CGA Goal status: PROGRESSING  4.  Patient will increase six minute walk test distance to >1000 for progression to community ambulator and improve gait ability  Baseline:  699 feet having to stop the test at 49min16sec due to fatigue (213 meters,  Avg speed 0.42m/s) no AD, with CGA and pt frequently reaching for the wall to support his imbalance 02/16/24: 740' with no AD Goal status: PROGRESSING  5.  Patient will increase ABC scale score >80% to demonstrate better functional mobility and better confidence with ADLs.  Baseline: 56.25% 02/16/24: 70.63% Goal status: PROGRESSING   ASSESSMENT:  CLINICAL IMPRESSION:   Focus of today's session was improved use of ankle activation open and closed chair with noted improvement ability to bear weight through forefoot with partial heel raise and sled push, but poor carryover in functional gait training. Pt reports that he may feel ready to d/c within the next couple sessions.  Pt will continue to benefit from skilled therapy to address remaining deficits in order to improve overall QoL and return to PLOF.      OBJECTIVE IMPAIRMENTS: Abnormal gait, decreased balance, decreased knowledge of condition, decreased knowledge of use of DME, decreased mobility, difficulty walking, decreased strength, impaired sensation, and improper body mechanics.   ACTIVITY LIMITATIONS: carrying, lifting, bending, standing, squatting, stairs, transfers, bathing, toileting, dressing, hygiene/grooming, and locomotion level  PARTICIPATION LIMITATIONS: meal prep, cleaning, laundry, shopping, community activity, occupation, yard work, and dancing  PERSONAL FACTORS: Age, Time since onset of injury/illness/exacerbation, and 3+ comorbidities: HTN, thyroid  disease, and diabetes are also affecting patient's functional outcome.   REHAB POTENTIAL: Good  CLINICAL DECISION  MAKING: Stable/uncomplicated  EVALUATION COMPLEXITY: Moderate  PLAN:  PT FREQUENCY: 1-2x/week  PT DURATION: 12 weeks  PLANNED INTERVENTIONS: [id number]- PT Re-evaluation, 97750- Physical Performance Testing, 97110-Therapeutic exercises, 97530- Therapeutic activity, [id number]- Neuromuscular re-education, 97535- Self Care, [id number]- Manual therapy, [id number]- Gait training, [phone]- Canalith repositioning, [phone]- Electrical stimulation (manual), Patient/Family education, Balance training, Stair training, Joint mobilization, Vestibular training, DME instructions, Cryotherapy, Moist heat, and Biofeedback   PLAN FOR NEXT SESSION:   - eyes closed as appropriate, daily walking, etc. - standing balance focusing on foot flat as pt tends to have posterior lean/wt shift into heels as well as narrow stance - gait training with improved foot rocker, rolling through toes during terminal stance/pre-swing -continue expanding ankle strengthening.   Massie Dollar PT, DPT  Physical Therapist - [facility]  11:07 AM 03/14/24

## 2024-03-17 ENCOUNTER — Ambulatory Visit: Attending: Neurology | Admitting: Physical Therapy

## 2024-03-17 DIAGNOSIS — R262 Difficulty in walking, not elsewhere classified: Secondary | ICD-10-CM | POA: Insufficient documentation

## 2024-03-17 DIAGNOSIS — R2681 Unsteadiness on feet: Secondary | ICD-10-CM | POA: Diagnosis present

## 2024-03-17 DIAGNOSIS — R531 Weakness: Secondary | ICD-10-CM | POA: Insufficient documentation

## 2024-03-17 DIAGNOSIS — R269 Unspecified abnormalities of gait and mobility: Secondary | ICD-10-CM | POA: Diagnosis present

## 2024-03-17 NOTE — Therapy (Signed)
 OUTPATIENT PHYSICAL THERAPY NEURO TREATMENT  Patient Name: Adam Castillo MRN: [id number] DOB:May 06, 1946, 78 y.o., male Today's Date: 03/17/2024   PCP: Kristina Tinnie POUR, PA-C  REFERRING PROVIDER: Lane Arthea BRAVO, MD   END OF SESSION:   PT End of Session - 03/17/24 1021     Visit Number 19    Number of Visits 24    Date for PT Re-Evaluation 04/04/24    PT Start Time 1020    PT Stop Time 1100    PT Time Calculation (min) 40 min    Equipment Utilized During Treatment Gait belt    Activity Tolerance Patient tolerated treatment well    Behavior During Therapy WFL for tasks assessed/performed             Past Medical History:  Diagnosis Date   Diabetes mellitus without complication (HCC)    Hypertension    Thyroid  disease    Past Surgical History:  Procedure Laterality Date   COLONOSCOPY WITH PROPOFOL      COLONOSCOPY WITH PROPOFOL  N/A 10/15/2021   Procedure: COLONOSCOPY WITH PROPOFOL ;  Surgeon: Therisa Bi, MD;  Location: [facility] ENDOSCOPY;  Service: Gastroenterology;  Laterality: N/A;   HERNIA REPAIR     INSERTION OF MESH  08/27/2021   Procedure: INSERTION OF MESH;  Surgeon: Jordis Laneta FALCON, MD;  Location: [facility] ORS;  Service: General;;   Patient Active Problem List   Diagnosis Date Noted   Hernia of scrotum 11/02/2020   Unilateral recurrent inguinal hernia without obstruction or gangrene 11/02/2020   Stage 3a chronic kidney disease (HCC) 08/14/2020   Annual physical exam 08/02/2020   Abrasion of right hand 08/02/2020   Primary osteoarthritis of right knee 06/12/2020   Hypothyroidism due to Hashimoto's thyroiditis 02/10/2020   Dyslipidemia 02/10/2020   Essential hypertension 02/07/2020    ONSET DATE: Reports steady decline over past 2 years  REFERRING DIAG:  G62.9 (ICD-10-CM) - Polyneuropathy  R26.9 (ICD-10-CM) - Gait abnormality  R26.89 (ICD-10-CM) - Balance disorder    THERAPY DIAG:  Difficulty in walking  Generalized weakness  Abnormality of  gait  Unsteadiness on feet  Rationale for Evaluation and Treatment: Rehabilitation  SUBJECTIVE:                                                                                                                                                                                             SUBJECTIVE STATEMENT:  Patient reports that he is doing well. No pain reported by pt. States that he had a quiet weekend. And no plans for holiday.  Neurology appointment has been postponed to late July.    From Initial Eval: Pt reports  steady decline in his balance over the past 2 years after he retired. 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 Pt denies falls, but states he walks like a drunk man. Pt wearing nice dressier shoes today. Pt reports increased difficulty with any household tasks that requires him to stand, due to his balance, but primarily is disappointed he can no longer dance.   Reports it feels like he is walking on sponges from where his feet are numb. Pt states standing in 1 place causes his feet to be more numb.  Pt accompanied by: self  PERTINENT HISTORY: Pt reports hx of hernial operation and prior R hand/wrist fx with no residual deficits, but otherwise healthy PMH: HTN, thyroid  disease, and diabetes  PAIN:  Are you having pain? No  PRECAUTIONS: Fall  RED FLAGS: None   WEIGHT BEARING RESTRICTIONS: No  FALLS: Has patient fallen in last 6 months? No and states he walks like a drunk and stumbles a lot  LIVING ENVIRONMENT: Lives with: lives alone Lives in: House/apartment Stairs: Yes: External: 3 steps; none or can enter through the garage with 1 step-up to enter Has following equipment at home: built in shower seat  PLOF: Independent with basic ADLs, Independent with homemaking with ambulation, and Independent with gait  PATIENT GOALS: prevent his balance from getting worse and ideally go back to work and  return to dancing  OBJECTIVE:  Note: Objective measures were completed at Evaluation unless otherwise noted.  DIAGNOSTIC FINDINGS:  EMG Impression: Abnormal study. 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  Electronically signed by Lane Arthea Locus, MD at 07/24/2023 12:08 PM EST   COGNITION: Overall cognitive status: Within functional limits for tasks assessed   SENSATION: Light touch: Impaired bilaterally with R LE more impaired than L Proprioception: not formally assessed, but appears impaired during functional mobility  COORDINATION: WFL heel to shin  EDEMA:  None observed on eval  MUSCLE TONE: appears WNL  MUSCLE LENGTH: Not formally assessed  DTRs:  Patella: may benefit from testing Achilles: may benefit from testing  POSTURE: rounded shoulders, forward head, and posterior pelvic tilt  LOWER EXTREMITY ROM:     Active   WFL throughout B LEs Right Eval Left Eval  Hip flexion    Hip extension    Hip abduction    Hip adduction    Hip internal rotation    Hip external rotation    Knee flexion    Knee extension    Ankle dorsiflexion    Ankle plantarflexion    Ankle inversion    Ankle eversion     (Blank rows = not tested)  LOWER EXTREMITY MMT:    MMT Right Eval Left Eval  Hip flexion 5 5  Hip extension    Hip abduction    Hip adduction    Hip internal rotation    Hip external rotation    Knee flexion 5 5  Knee extension 5 5  Ankle dorsiflexion 4- 4-  Ankle plantarflexion 4- 4-  Ankle inversion    Ankle eversion    (Blank rows = not tested)  Manual Muscle Test Scale 0/5 = No muscle contraction can be seen or felt 1/5 = Contraction can be felt, but there is no motion 2-/5 = Part moves through incomplete ROM w/ gravity decreased 2/5 = Part moves through complete ROM w/ gravity decreased 2+/5 = Part moves through incomplete  ROM (<50%) against gravity or through complete  ROM w/ gravity 3-/5 = Part moves through incomplete ROM (>50%) against gravity 3/5 = Part moves through complete ROM against gravity 3+/5 = Part moves through complete ROM against gravity/slight resistance 4-/5= Holds test position against slight to moderate pressure 4/5 = Part moves through complete ROM against gravity/moderate resistance 4+/5= Holds test position against moderate to strong pressure 5/5 = Part moves through complete ROM against gravity/full resistance  BED MOBILITY:  Findings: not formally assessed, but not reports of difficulty  TRANSFERS: Sit to stand: CGA  Assistive device utilized: None     Stand to sit: CGA  Assistive device utilized: None     Chair to chair: CGA  Assistive device utilized: None       RAMP:  Not tested, but would benefit from testing  CURB:  Not tested, but would benefit from testing  STAIRS: Need to assess GAIT: Findings: Gait Characteristics: excessive posterior lean throughout with his weight primarily through his heels, step through pattern, decreased step length- Right, decreased step length- Left, decreased stride length, and narrow BOS, Distance walked: ~142ft,  Assistive device utilized:None Level of assistance: CGA Comments: excessive heel strike with lateral bias and pt doesn't transfer weight through midfoot nor forefoot during stance, excessive posterior pelvic tilt throughout with compensatory excessive thoracic rounding to prevent posterior LOB  FUNCTIONAL TESTS:  6 minute walk test: need to assess 10 meter walk test: 0.748m/s Berg Balance Scale: 34/56 Functional gait assessment: need to assess  PATIENT SURVEYS:  ABC scale 56.25% with pt reporting primarily 50% confident with majority of the items including walking around the house - pt reporting he is unsteady with all standing tasks                                                                                                                              TREATMENT DATE:  03/17/2024  PT applied gait belt and provided CGA throughout session for improved safety and reduced fall rise nless otherwise noted.  Sit<>stand x 12; mild LOB on first two reps, but improved COM control with increased repetitions.  Sit<>stand to raise 3KG ball overhead 2 x 10, mild LOB in first rep, stepping strategy to correct.   Standing on airex beam 2 x 30 sec no UE support  Tandem stance on beam with 1 UE support 2 x 30 sec bil Side stepping on beam x 6 bil with light UE support   Wide stance on level surface eyes closed 3 x 30 sec intermittent UE support  SLS with contralateral knee flexion 10 sec x 3 bil with BUE support   Farmers carry with single 15# kettle bell x 117ft in each UE. Cues for full use of foot rocker as tolerated.   Seated  heel raise from 1/2 bolster 2 x 15 Toe raise with heels on 1/2 bolster x 15   PATIENT EDUCATION: Education details: Therapy POC, findings on evaluation  today, performance of daily foot assessments, balance systems Pt educated throughout session about proper posture and technique with exercises. Improved exercise technique, movement at target joints, use of target muscles after min to mod verbal, visual, tactile cues. Possible correlation of neuropathy and hx of Lumbar disc disease.   Person educated: Patient Education method: Explanation Education comprehension: verbalized understanding and needs further education  HOME EXERCISE PROGRAM:  Access Code: [email or web address] URL: [email or web address] Date: 02/10/2024 Prepared by: Massie Dollar  Exercises - Wide Stance with Counter Support  - 1 x daily - 7 x weekly - 3 sets - 30 seconds hold - Sit to Stand with Counter Support  - 1 x daily - 7 x weekly - 2 sets - 10 reps - Seated Ankle Plantar Flexion with Resistance Loop  - 1 x daily - 7 x weekly - 2 sets - 10 reps - Seated Ankle Eversion with Anchored Resistance  - 1 x daily - 7 x weekly - 2 sets - 10 reps - Standing Romberg to 1/2  Tandem Stance  - 1 x daily - 7 x weekly - 3 sets - 10 reps - 20 hold - Backward Walking with Counter Support  - 1 x daily - 7 x weekly - 3 sets - 10 reps - Side Lunge with Counter Support  - 1 x daily - 7 x weekly - 3 sets - 10 reps   GOALS: Goals reviewed with patient? Yes  SHORT TERM GOALS: Target date: 02/22/2024  Patient will be independent in home exercise program to improve strength/mobility and balance for better functional independence with ADLs.  Baseline: need to initiate Goal status: INITIAL   LONG TERM GOALS: Target date: 04/04/2024  Patient will increase Berg Balance score to > 51/56 to demonstrate improved balance and decreased fall risk during functional activities and ADLs.  Baseline: 34/56 02/16/24: 40/56 Goal status: PROGRESSING  2.  Patient will increase Functional Gait Assessment (FGA) score to >20/30 as to reduce fall risk and improve dynamic gait safety with community ambulation.  Baseline: 5/30 due to pt frequently reaching for wall support from being unbalanced 02/16/24: 20/30 Goal status: PROGRESSING  3.  Patient will increase 10 meter walk test to >1.58m/s as to improve gait speed for better community ambulation and to reduce fall risk. Baseline: 0.713m/s without AD, CGA 02/16/24: 0.923 m/s without AD, CGA Goal status: PROGRESSING  4.  Patient will increase six minute walk test distance to >1000 for progression to community ambulator and improve gait ability  Baseline:  699 feet having to stop the test at 68min16sec due to fatigue (213 meters, Avg speed 0.23m/s) no AD, with CGA and pt frequently reaching for the wall to support his imbalance 02/16/24: 740' with no AD Goal status: PROGRESSING  5.  Patient will increase ABC scale score >80% to demonstrate better functional mobility and better confidence with ADLs.  Baseline: 56.25% 02/16/24: 70.63% Goal status: PROGRESSING   ASSESSMENT:  CLINICAL IMPRESSION:   Focus of today's session was improve closed chain  ankle activation on unlevel surface and righting reactions with reduced visual input. Pt tolerated treatment well, with increased WB through entire forefoot on unlevel surface in various planes of movement as well as improved use of ankle strategy to correct anterior LOB with eyes closed compared to prior attempts. Pt still reports feels extremely uncomfortable pushing through toes with or without visual input.  Pt will continue to benefit from skilled therapy to address remaining deficits in order to improve overall QoL and return  to PLOF.      OBJECTIVE IMPAIRMENTS: Abnormal gait, decreased balance, decreased knowledge of condition, decreased knowledge of use of DME, decreased mobility, difficulty walking, decreased strength, impaired sensation, and improper body mechanics.   ACTIVITY LIMITATIONS: carrying, lifting, bending, standing, squatting, stairs, transfers, bathing, toileting, dressing, hygiene/grooming, and locomotion level  PARTICIPATION LIMITATIONS: meal prep, cleaning, laundry, shopping, community activity, occupation, yard work, and dancing  PERSONAL FACTORS: Age, Time since onset of injury/illness/exacerbation, and 3+ comorbidities: HTN, thyroid  disease, and diabetes are also affecting patient's functional outcome.   REHAB POTENTIAL: Good  CLINICAL DECISION MAKING: Stable/uncomplicated  EVALUATION COMPLEXITY: Moderate  PLAN:  PT FREQUENCY: 1-2x/week  PT DURATION: 12 weeks  PLANNED INTERVENTIONS: 97164- PT Re-evaluation, 97750- Physical Performance Testing, 97110-Therapeutic exercises, 97530- Therapeutic activity, [id number]- Neuromuscular re-education, 97535- Self Care, [id number]- Manual therapy, [id number]- Gait training, [phone]- Canalith repositioning, [phone]- Electrical stimulation (manual), Patient/Family education, Balance training, Stair training, Joint mobilization, Vestibular training, DME instructions, Cryotherapy, Moist heat, and Biofeedback   PLAN FOR NEXT SESSION:   Progress  note. HEP expansion.  - eyes closed as appropriate, daily walking, etc. - standing balance focusing on foot flat as pt tends to have posterior lean/wt shift into heels as well as narrow stance - gait training with improved foot rocker, rolling through toes during terminal stance/pre-swing    Massie Dollar PT, DPT  Physical Therapist - [facility]  [facility]  10:22 AM 03/17/24

## 2024-03-22 LAB — BASIC METABOLIC PANEL WITH GFR
BUN/Creatinine Ratio: 19 (ref 10–24)
BUN: 30 mg/dL — ABNORMAL HIGH (ref 8–27)
CO2: 21 mmol/L (ref 20–29)
Calcium: 9.8 mg/dL (ref 8.6–10.2)
Chloride: 102 mmol/L (ref 96–106)
Creatinine, Ser: 1.57 mg/dL — ABNORMAL HIGH (ref 0.76–1.27)
Glucose: 113 mg/dL — ABNORMAL HIGH (ref 70–99)
Potassium: 4.2 mmol/L (ref 3.5–5.2)
Sodium: 140 mmol/L (ref 134–144)
eGFR: 45 mL/min/1.73 — ABNORMAL LOW (ref >59)

## 2024-03-23 ENCOUNTER — Ambulatory Visit: Admitting: Physical Therapy

## 2024-03-23 DIAGNOSIS — R262 Difficulty in walking, not elsewhere classified: Secondary | ICD-10-CM | POA: Diagnosis not present

## 2024-03-23 DIAGNOSIS — R531 Weakness: Secondary | ICD-10-CM

## 2024-03-23 DIAGNOSIS — R269 Unspecified abnormalities of gait and mobility: Secondary | ICD-10-CM

## 2024-03-23 DIAGNOSIS — R2681 Unsteadiness on feet: Secondary | ICD-10-CM

## 2024-03-23 NOTE — Therapy (Signed)
 OUTPATIENT PHYSICAL THERAPY NEURO TREATMENT/ PHYSICAL THERAPY PROGRESS NOTE   Dates of reporting period  02/16/24   to   03/23/2024    Patient Name: Adam Castillo MRN: [id number] DOB:[birth year], 78 y.o., male Today's Date: 03/23/2024   PCP: Kristina Tinnie POUR, PA-C  REFERRING PROVIDER: Lane Arthea BRAVO, MD   END OF SESSION:   PT End of Session - 03/23/24 1534     Visit Number 20    Number of Visits 24    Date for PT Re-Evaluation 04/04/24    PT Start Time 1535    PT Stop Time 1615    PT Time Calculation (min) 40 min    Equipment Utilized During Treatment Gait belt    Activity Tolerance Patient tolerated treatment well    Behavior During Therapy WFL for tasks assessed/performed             Past Medical History:  Diagnosis Date   Diabetes mellitus without complication (HCC)    Hypertension    Thyroid  disease    Past Surgical History:  Procedure Laterality Date   COLONOSCOPY WITH PROPOFOL      COLONOSCOPY WITH PROPOFOL  N/A 10/15/2021   Procedure: COLONOSCOPY WITH PROPOFOL ;  Surgeon: Therisa Bi, MD;  Location: [facility];  Service: Gastroenterology;  Laterality: N/A;   HERNIA REPAIR     INSERTION OF MESH  08/27/2021   Procedure: INSERTION OF MESH;  Surgeon: Jordis Laneta FALCON, MD;  Location: [facility] ORS;  Service: General;;   Patient Active Problem List   Diagnosis Date Noted   Hernia of scrotum 11/02/2020   Unilateral recurrent inguinal hernia without obstruction or gangrene 11/02/2020   Stage 3a chronic kidney disease (HCC) 08/14/2020   Annual physical exam 08/02/2020   Abrasion of right hand 08/02/2020   Primary osteoarthritis of right knee 06/12/2020   Hypothyroidism due to Hashimoto's thyroiditis 02/10/2020   Dyslipidemia 02/10/2020   Essential hypertension 02/07/2020    ONSET DATE: Reports steady decline over past 2 years  REFERRING DIAG:  G62.9 (ICD-10-CM) - Polyneuropathy  R26.9 (ICD-10-CM) - Gait abnormality  R26.89 (ICD-10-CM) - Balance disorder     THERAPY DIAG:  Difficulty in walking  Generalized weakness  Abnormality of gait  Unsteadiness on feet  Rationale for Evaluation and Treatment: Rehabilitation  SUBJECTIVE:                                                                                                                                                                                             SUBJECTIVE STATEMENT:  Patient reports that he is doing well. Was able to perform yard work tasks without issue on this day including weed  eating through ditch and around house.    From Initial Eval: Pt reports steady decline in his balance over the past 2 years after he retired. 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 Pt denies falls, but states he walks like a drunk man. Pt wearing nice dressier shoes today. Pt reports increased difficulty with any household tasks that requires him to stand, due to his balance, but primarily is disappointed he can no longer dance.   Reports it feels like he is walking on sponges from where his feet are numb. Pt states standing in 1 place causes his feet to be more numb.  Pt accompanied by: self  PERTINENT HISTORY: Pt reports hx of hernial operation and prior R hand/wrist fx with no residual deficits, but otherwise healthy PMH: HTN, thyroid  disease, and diabetes  PAIN:  Are you having pain? No  PRECAUTIONS: Fall  RED FLAGS: None   WEIGHT BEARING RESTRICTIONS: No  FALLS: Has patient fallen in last 6 months? No and states he walks like a drunk and stumbles a lot  LIVING ENVIRONMENT: Lives with: lives alone Lives in: House/apartment Stairs: Yes: External: 3 steps; none or can enter through the garage with 1 step-up to enter Has following equipment at home: built in shower seat  PLOF: Independent with basic ADLs, Independent with homemaking with ambulation, and Independent with gait  PATIENT GOALS: prevent his  balance from getting worse and ideally go back to work and return to dancing  OBJECTIVE:  Note: Objective measures were completed at Evaluation unless otherwise noted.  DIAGNOSTIC FINDINGS:  EMG Impression: Abnormal study. 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  Electronically signed by Lane Arthea Locus, MD at 07/24/2023 12:08 PM EST   COGNITION: Overall cognitive status: Within functional limits for tasks assessed   SENSATION: Light touch: Impaired bilaterally with R LE more impaired than L Proprioception: not formally assessed, but appears impaired during functional mobility  COORDINATION: WFL heel to shin  EDEMA:  None observed on eval  MUSCLE TONE: appears WNL  MUSCLE LENGTH: Not formally assessed  DTRs:  Patella: may benefit from testing Achilles: may benefit from testing  POSTURE: rounded shoulders, forward head, and posterior pelvic tilt  LOWER EXTREMITY ROM:     Active   WFL throughout B LEs Right Eval Left Eval  Hip flexion    Hip extension    Hip abduction    Hip adduction    Hip internal rotation    Hip external rotation    Knee flexion    Knee extension    Ankle dorsiflexion    Ankle plantarflexion    Ankle inversion    Ankle eversion     (Blank rows = not tested)  LOWER EXTREMITY MMT:    MMT Right Eval Left Eval  Hip flexion 5 5  Hip extension    Hip abduction    Hip adduction    Hip internal rotation    Hip external rotation    Knee flexion 5 5  Knee extension 5 5  Ankle dorsiflexion 4- 4-  Ankle plantarflexion 4- 4-  Ankle inversion    Ankle eversion    (Blank rows = not tested)  Manual Muscle Test Scale 0/5 = No muscle contraction can be seen or felt 1/5 = Contraction can be felt, but there is no motion 2-/5 = Part moves through incomplete ROM w/ gravity decreased 2/5 =  Part moves through complete ROM w/ gravity decreased 2+/5 = Part moves through  incomplete ROM (<50%) against gravity or through complete ROM w/ gravity 3-/5 = Part moves through incomplete ROM (>50%) against gravity 3/5 = Part moves through complete ROM against gravity 3+/5 = Part moves through complete ROM against gravity/slight resistance 4-/5= Holds test position against slight to moderate pressure 4/5 = Part moves through complete ROM against gravity/moderate resistance 4+/5= Holds test position against moderate to strong pressure 5/5 = Part moves through complete ROM against gravity/full resistance  BED MOBILITY:  Findings: not formally assessed, but not reports of difficulty  TRANSFERS: Sit to stand: CGA  Assistive device utilized: None     Stand to sit: CGA  Assistive device utilized: None     Chair to chair: CGA  Assistive device utilized: None       RAMP:  Not tested, but would benefit from testing  CURB:  Not tested, but would benefit from testing  STAIRS: Need to assess GAIT: Findings: Gait Characteristics: excessive posterior lean throughout with his weight primarily through his heels, step through pattern, decreased step length- Right, decreased step length- Left, decreased stride length, and narrow BOS, Distance walked: ~117ft,  Assistive device utilized:None Level of assistance: CGA Comments: excessive heel strike with lateral bias and pt doesn't transfer weight through midfoot nor forefoot during stance, excessive posterior pelvic tilt throughout with compensatory excessive thoracic rounding to prevent posterior LOB  FUNCTIONAL TESTS:  6 minute walk test: need to assess 10 meter walk test: 0.713m/s Berg Balance Scale: 34/56 Functional gait assessment: need to assess  PATIENT SURVEYS:  ABC scale 56.25% with pt reporting primarily 50% confident with majority of the items including walking around the house - pt reporting he is unsteady with all standing tasks                                                                                                                               TREATMENT DATE: 03/23/2024  PT instructed pt LTG assessment for progress note.   Pt performed 5 time sit<>stand (5xSTS): 12.21 sec (>15 sec indicates increased fall risk)   Patient demonstrates increased fall risk as noted by score of   43/56 on Berg Balance Scale.  (<36= high risk for falls, close to 100%; 37-45 significant >80%; 46-51 moderate >50%; 52-55 lower >25%)  10 Meter Walk Test: Patient instructed to walk 10 meters (32.8 ft) as quickly and as safely as possible at their normal speed x2 and at a fast speed x2. Time measured from 2 meter mark to 8 meter mark to accommodate ramp-up and ramp-down.  Normal speed 1: 11.18sec Normal speed 2: 12.2 sec  Average Normal speed: 0.86 m/s Fast speed 1: 9.73 sec Fast speed 2: 9.71sec  Average Fast speed: 1.03 m/s Cut off scores: <0.4 m/s = household Ambulator, 0.4-0.8 m/s = limited community Ambulator, >0.8 m/s = community Ambulator, >1.2 m/s = crossing a street, <1.0 =  increased fall risk MCID 0.05 m/s (small), 0.13 m/s (moderate), 0.06 m/s (significant)  (ANPTA Core Set of Outcome Measures for Adults with Neurologic Conditions, 2018)  Completed ABC with clarification from PT for multiple questions.   PATIENT EDUCATION: Education details: Therapy POC, findings on evaluation today, performance of daily foot assessments, balance systems Pt educated throughout session about proper posture and technique with exercises. Improved exercise technique, movement at target joints, use of target muscles after min to mod verbal, visual, tactile cues. Possible correlation of neuropathy and hx of Lumbar disc disease.   Person educated: Patient Education method: Explanation Education comprehension: verbalized understanding and needs further education  HOME EXERCISE PROGRAM:  Access Code: [email or web address] URL: [email or web address] Date: 02/10/2024 Prepared by: Massie Dollar  Exercises - Wide Stance  with Counter Support  - 1 x daily - 7 x weekly - 3 sets - 30 seconds hold - Sit to Stand with Counter Support  - 1 x daily - 7 x weekly - 2 sets - 10 reps - Seated Ankle Plantar Flexion with Resistance Loop  - 1 x daily - 7 x weekly - 2 sets - 10 reps - Seated Ankle Eversion with Anchored Resistance  - 1 x daily - 7 x weekly - 2 sets - 10 reps - Standing Romberg to 1/2 Tandem Stance  - 1 x daily - 7 x weekly - 3 sets - 10 reps - 20 hold - Backward Walking with Counter Support  - 1 x daily - 7 x weekly - 3 sets - 10 reps - Side Lunge with Counter Support  - 1 x daily - 7 x weekly - 3 sets - 10 reps   GOALS: Goals reviewed with patient? Yes  SHORT TERM GOALS: Target date: 02/22/2024  Patient will be independent in home exercise program to improve strength/mobility and balance for better functional independence with ADLs.  Baseline: need to initiate Goal status: INITIAL   LONG TERM GOALS: Target date: 04/04/2024  Patient will increase Berg Balance score to > 51/56 to demonstrate improved balance and decreased fall risk during functional activities and ADLs.  Baseline: 34/56 02/16/24: 40/56 7/9: 43/56  Goal status: PROGRESSING  2.  Patient will increase Functional Gait Assessment (FGA) score to >20/30 as to reduce fall risk and improve dynamic gait safety with community ambulation.  Baseline: 5/30 due to pt frequently reaching for wall support from being unbalanced 02/16/24: 20/30 Goal status: PROGRESSING  3.  Patient will increase 10 meter walk test to >1.39m/s as to improve gait speed for better community ambulation and to reduce fall risk. Baseline: 0.746m/s without AD, CGA 02/16/24: 0.923 m/s without AD, CGA 7/9: Average Fast speed: 1.03 m/s Goal status: PROGRESSING  4.  Patient will increase six minute walk test distance to >1000 for progression to community ambulator and improve gait ability  Baseline:  699 feet having to stop the test at 64min16sec due to fatigue (213 meters, Avg speed  0.69m/s) no AD, with CGA and pt frequently reaching for the wall to support his imbalance 02/16/24: 740' with no AD 7/9: 942ft 5:42 min then rest break required; no AD.  Goal status: PROGRESSING  5.  Patient will increase ABC scale score >80% to demonstrate better functional mobility and better confidence with ADLs.   Baseline: 56.25% 02/16/24: 70.63% 7/9: 81.25% Goal status: MET   ASSESSMENT:  CLINICAL IMPRESSION:   Pt arrived motivated to participate. Completed goal assessment for progress note. Pt has met 2 of 5 LTG with improved gait speed  and confidence and balance noted in ABC scale. Increased distance in 6 min walk test as well increased score on Berg balance scale indicate improved balance, but progress still attainable to be able to indicate reduced fall risk. Patient's condition has the potential to improve in response to therapy. Maximum improvement is yet to be obtained. The anticipated improvement is attainable and reasonable in a generally predictable time.  Pt will continue to benefit from skilled therapy to address remaining deficits in order to improve overall QoL and return to PLOF.      OBJECTIVE IMPAIRMENTS: Abnormal gait, decreased balance, decreased knowledge of condition, decreased knowledge of use of DME, decreased mobility, difficulty walking, decreased strength, impaired sensation, and improper body mechanics.   ACTIVITY LIMITATIONS: carrying, lifting, bending, standing, squatting, stairs, transfers, bathing, toileting, dressing, hygiene/grooming, and locomotion level  PARTICIPATION LIMITATIONS: meal prep, cleaning, laundry, shopping, community activity, occupation, yard work, and dancing  PERSONAL FACTORS: Age, Time since onset of injury/illness/exacerbation, and 3+ comorbidities: HTN, thyroid  disease, and diabetes are also affecting patient's functional outcome.   REHAB POTENTIAL: Good  CLINICAL DECISION MAKING: Stable/uncomplicated  EVALUATION COMPLEXITY:  Moderate  PLAN:  PT FREQUENCY: 1-2x/week  PT DURATION: 12 weeks  PLANNED INTERVENTIONS: 97164- PT Re-evaluation, 97750- Physical Performance Testing, 97110-Therapeutic exercises, 97530- Therapeutic activity, [id number]- Neuromuscular re-education, 97535- Self Care, [id number]- Manual therapy, [id number]- Gait training, [phone]- Canalith repositioning, [phone]- Electrical stimulation (manual), Patient/Family education, Balance training, Stair training, Joint mobilization, Vestibular training, DME instructions, Cryotherapy, Moist heat, and Biofeedback   PLAN FOR NEXT SESSION:   HEP expansion.  FGA - eyes closed as appropriate, daily walking, etc. - standing balance focusing on foot flat as pt tends to have posterior lean/wt shift into heels as well as narrow stance     Massie Dollar PT, DPT  Physical Therapist - [facility]  [facility]  3:34 PM 03/23/24

## 2024-03-24 ENCOUNTER — Telehealth: Payer: Self-pay | Admitting: Physician Assistant

## 2024-03-24 NOTE — Telephone Encounter (Signed)
 Left vm to confirm 04/01/24 appointment-Toni

## 2024-03-28 ENCOUNTER — Ambulatory Visit: Admitting: Physical Therapy

## 2024-03-28 DIAGNOSIS — R531 Weakness: Secondary | ICD-10-CM

## 2024-03-28 DIAGNOSIS — R262 Difficulty in walking, not elsewhere classified: Secondary | ICD-10-CM

## 2024-03-28 DIAGNOSIS — R2681 Unsteadiness on feet: Secondary | ICD-10-CM

## 2024-03-28 DIAGNOSIS — R269 Unspecified abnormalities of gait and mobility: Secondary | ICD-10-CM

## 2024-03-28 NOTE — Therapy (Unsigned)
 OUTPATIENT PHYSICAL THERAPY NEURO TREATMENT/  Patient Name: Adam Castillo MRN: [id number] DOB:1945-12-20, 78 y.o., male Today's Date: 03/28/2024   PCP: Kristina Tinnie POUR, PA-C  REFERRING PROVIDER: Lane Arthea BRAVO, MD   END OF SESSION:   PT End of Session - 03/28/24 1546     Visit Number 21    Number of Visits 24    Date for PT Re-Evaluation 04/04/24    PT Start Time 1535    PT Stop Time 1615    PT Time Calculation (min) 40 min    Equipment Utilized During Treatment Gait belt    Activity Tolerance Patient tolerated treatment well    Behavior During Therapy WFL for tasks assessed/performed             Past Medical History:  Diagnosis Date   Diabetes mellitus without complication (HCC)    Hypertension    Thyroid  disease    Past Surgical History:  Procedure Laterality Date   COLONOSCOPY WITH PROPOFOL      COLONOSCOPY WITH PROPOFOL  N/A 10/15/2021   Procedure: COLONOSCOPY WITH PROPOFOL ;  Surgeon: Therisa Bi, MD;  Location: [facility] ENDOSCOPY;  Service: Gastroenterology;  Laterality: N/A;   HERNIA REPAIR     INSERTION OF MESH  08/27/2021   Procedure: INSERTION OF MESH;  Surgeon: Jordis Laneta FALCON, MD;  Location: [facility] ORS;  Service: General;;   Patient Active Problem List   Diagnosis Date Noted   Hernia of scrotum 11/02/2020   Unilateral recurrent inguinal hernia without obstruction or gangrene 11/02/2020   Stage 3a chronic kidney disease (HCC) 08/14/2020   Annual physical exam 08/02/2020   Abrasion of right hand 08/02/2020   Primary osteoarthritis of right knee 06/12/2020   Hypothyroidism due to Hashimoto's thyroiditis 02/10/2020   Dyslipidemia 02/10/2020   Essential hypertension 02/07/2020    ONSET DATE: Reports steady decline over past 2 years  REFERRING DIAG:  G62.9 (ICD-10-CM) - Polyneuropathy  R26.9 (ICD-10-CM) - Gait abnormality  R26.89 (ICD-10-CM) - Balance disorder    THERAPY DIAG:  Difficulty in walking  Generalized weakness  Unsteadiness on  feet  Abnormality of gait  Rationale for Evaluation and Treatment: Rehabilitation  SUBJECTIVE:                                                                                                                                                                                             SUBJECTIVE STATEMENT:  Patient reports that he is doing well. Was able to perform yard work tasks without issue on this day including weed eating through ditch and around house.    From Initial Eval: Pt reports steady decline in his balance over  the past 2 years after he retired. 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 Pt denies falls, but states he walks like a drunk man. Pt wearing nice dressier shoes today. Pt reports increased difficulty with any household tasks that requires him to stand, due to his balance, but primarily is disappointed he can no longer dance.   Reports it feels like he is walking on sponges from where his feet are numb. Pt states standing in 1 place causes his feet to be more numb.  Pt accompanied by: self  PERTINENT HISTORY: Pt reports hx of hernial operation and prior R hand/wrist fx with no residual deficits, but otherwise healthy PMH: HTN, thyroid  disease, and diabetes  PAIN:  Are you having pain? No  PRECAUTIONS: Fall  RED FLAGS: None   WEIGHT BEARING RESTRICTIONS: No  FALLS: Has patient fallen in last 6 months? No and states he walks like a drunk and stumbles a lot  LIVING ENVIRONMENT: Lives with: lives alone Lives in: House/apartment Stairs: Yes: External: 3 steps; none or can enter through the garage with 1 step-up to enter Has following equipment at home: built in shower seat  PLOF: Independent with basic ADLs, Independent with homemaking with ambulation, and Independent with gait  PATIENT GOALS: prevent his balance from getting worse and ideally go back to work and return to dancing  OBJECTIVE:   Note: Objective measures were completed at Evaluation unless otherwise noted.  DIAGNOSTIC FINDINGS:  EMG Impression: Abnormal study. 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  Electronically signed by Lane Arthea Locus, MD at 07/24/2023 12:08 PM EST   COGNITION: Overall cognitive status: Within functional limits for tasks assessed   SENSATION: Light touch: Impaired bilaterally with R LE more impaired than L Proprioception: not formally assessed, but appears impaired during functional mobility  COORDINATION: WFL heel to shin  EDEMA:  None observed on eval  MUSCLE TONE: appears WNL  MUSCLE LENGTH: Not formally assessed  DTRs:  Patella: may benefit from testing Achilles: may benefit from testing  POSTURE: rounded shoulders, forward head, and posterior pelvic tilt  LOWER EXTREMITY ROM:     Active   WFL throughout B LEs Right Eval Left Eval  Hip flexion    Hip extension    Hip abduction    Hip adduction    Hip internal rotation    Hip external rotation    Knee flexion    Knee extension    Ankle dorsiflexion    Ankle plantarflexion    Ankle inversion    Ankle eversion     (Blank rows = not tested)  LOWER EXTREMITY MMT:    MMT Right Eval Left Eval  Hip flexion 5 5  Hip extension    Hip abduction    Hip adduction    Hip internal rotation    Hip external rotation    Knee flexion 5 5  Knee extension 5 5  Ankle dorsiflexion 4- 4-  Ankle plantarflexion 4- 4-  Ankle inversion    Ankle eversion    (Blank rows = not tested)  Manual Muscle Test Scale 0/5 = No muscle contraction can be seen or felt 1/5 = Contraction can be felt, but there is no motion 2-/5 = Part moves through incomplete ROM w/ gravity decreased 2/5 = Part moves through complete ROM w/ gravity decreased 2+/5 = Part moves through incomplete ROM (<50%) against gravity or through  complete ROM w/ gravity 3-/5 = Part  moves through incomplete ROM (>50%) against gravity 3/5 = Part moves through complete ROM against gravity 3+/5 = Part moves through complete ROM against gravity/slight resistance 4-/5= Holds test position against slight to moderate pressure 4/5 = Part moves through complete ROM against gravity/moderate resistance 4+/5= Holds test position against moderate to strong pressure 5/5 = Part moves through complete ROM against gravity/full resistance  BED MOBILITY:  Findings: not formally assessed, but not reports of difficulty  TRANSFERS: Sit to stand: CGA  Assistive device utilized: None     Stand to sit: CGA  Assistive device utilized: None     Chair to chair: CGA  Assistive device utilized: None       RAMP:  Not tested, but would benefit from testing  CURB:  Not tested, but would benefit from testing  STAIRS: Need to assess GAIT: Findings: Gait Characteristics: excessive posterior lean throughout with his weight primarily through his heels, step through pattern, decreased step length- Right, decreased step length- Left, decreased stride length, and narrow BOS, Distance walked: ~177ft,  Assistive device utilized:None Level of assistance: CGA Comments: excessive heel strike with lateral bias and pt doesn't transfer weight through midfoot nor forefoot during stance, excessive posterior pelvic tilt throughout with compensatory excessive thoracic rounding to prevent posterior LOB  FUNCTIONAL TESTS:  6 minute walk test: need to assess 10 meter walk test: 0.768m/s Berg Balance Scale: 34/56 Functional gait assessment: need to assess  PATIENT SURVEYS:  ABC scale 56.25% with pt reporting primarily 50% confident with majority of the items including walking around the house - pt reporting he is unsteady with all standing tasks                                                                                                                              TREATMENT DATE: 03/28/2024 Nustep 6 min,  level 1-6 with cues for SPM >65 throughout varied resistance  Ankle PF with GTB 2 x 20  Heel raise with 8# AW on each LE. 2 x 15  Sit<>stand holding weighted ball; 5kg: 2x 10   Reciprocal single limb posterior stepping over bolster x 15  BLE side stepping over bolster. X 12 bil  Step up/down 4 inch step x 10 bil with  Instruction from PT for emphasis on toe contact with descent to improve eccentric control and activation.   Gait without resistance x 339ft with instruction throughout to improve hip extension to allow improved rocker through forefoot   Pt noted to have difficulty with step length of eccentric control through BLE,   PATIENT EDUCATION: Education details: Therapy POC, findings on evaluation today, performance of daily foot assessments, balance systems Pt educated throughout session about proper posture and technique with exercises. Improved exercise technique, movement at target joints, use of target muscles after min to mod verbal, visual, tactile cues. Possible correlation of neuropathy and hx of Lumbar disc disease.   Person  educated: Patient Education method: Explanation Education comprehension: verbalized understanding and needs further education  HOME EXERCISE PROGRAM:  Access Code: [email or web address] URL: [email or web address] Date: 02/10/2024 Prepared by: Massie Dollar  Exercises - Wide Stance with Counter Support  - 1 x daily - 7 x weekly - 3 sets - 30 seconds hold - Sit to Stand with Counter Support  - 1 x daily - 7 x weekly - 2 sets - 10 reps - Seated Ankle Plantar Flexion with Resistance Loop  - 1 x daily - 7 x weekly - 2 sets - 10 reps - Seated Ankle Eversion with Anchored Resistance  - 1 x daily - 7 x weekly - 2 sets - 10 reps - Standing Romberg to 1/2 Tandem Stance  - 1 x daily - 7 x weekly - 3 sets - 10 reps - 20 hold - Backward Walking with Counter Support  - 1 x daily - 7 x weekly - 3 sets - 10 reps - Side Lunge with Counter Support  - 1 x daily  - 7 x weekly - 3 sets - 10 reps   GOALS: Goals reviewed with patient? Yes  SHORT TERM GOALS: Target date: 02/22/2024  Patient will be independent in home exercise program to improve strength/mobility and balance for better functional independence with ADLs.  Baseline: need to initiate Goal status: INITIAL   LONG TERM GOALS: Target date: 04/04/2024  Patient will increase Berg Balance score to > 51/56 to demonstrate improved balance and decreased fall risk during functional activities and ADLs.  Baseline: 34/56 02/16/24: 40/56 7/9: 43/56  Goal status: PROGRESSING  2.  Patient will increase Functional Gait Assessment (FGA) score to >20/30 as to reduce fall risk and improve dynamic gait safety with community ambulation.  Baseline: 5/30 due to pt frequently reaching for wall support from being unbalanced 02/16/24: 20/30 Goal status: PROGRESSING  3.  Patient will increase 10 meter walk test to >1.17m/s as to improve gait speed for better community ambulation and to reduce fall risk. Baseline: 0.754m/s without AD, CGA 02/16/24: 0.923 m/s without AD, CGA 7/9: Average Fast speed: 1.03 m/s Goal status: PROGRESSING  4.  Patient will increase six minute walk test distance to >1000 for progression to community ambulator and improve gait ability  Baseline:  699 feet having to stop the test at 48min16sec due to fatigue (213 meters, Avg speed 0.69m/s) no AD, with CGA and pt frequently reaching for the wall to support his imbalance 02/16/24: 740' with no AD 7/9: 967ft 5:42 min then rest break required; no AD.  Goal status: PROGRESSING  5.  Patient will increase ABC scale score >80% to demonstrate better functional mobility and better confidence with ADLs.   Baseline: 56.25% 02/16/24: 70.63% 7/9: 81.25% Goal status: MET   ASSESSMENT:  CLINICAL IMPRESSION:   Pt arrived motivated to participate. Completed goal assessment for progress note. Pt has met 2 of 5 LTG with improved gait speed and confidence  and balance noted in ABC scale. Increased distance in 6 min walk test as well increased score on Berg balance scale indicate improved balance, but progress still attainable to be able to indicate reduced fall risk. Patient's condition has the potential to improve in response to therapy. Maximum improvement is yet to be obtained. The anticipated improvement is attainable and reasonable in a generally predictable time.  Pt will continue to benefit from skilled therapy to address remaining deficits in order to improve overall QoL and return to PLOF.      OBJECTIVE IMPAIRMENTS:  Abnormal gait, decreased balance, decreased knowledge of condition, decreased knowledge of use of DME, decreased mobility, difficulty walking, decreased strength, impaired sensation, and improper body mechanics.   ACTIVITY LIMITATIONS: carrying, lifting, bending, standing, squatting, stairs, transfers, bathing, toileting, dressing, hygiene/grooming, and locomotion level  PARTICIPATION LIMITATIONS: meal prep, cleaning, laundry, shopping, community activity, occupation, yard work, and dancing  PERSONAL FACTORS: Age, Time since onset of injury/illness/exacerbation, and 3+ comorbidities: HTN, thyroid  disease, and diabetes are also affecting patient's functional outcome.   REHAB POTENTIAL: Good  CLINICAL DECISION MAKING: Stable/uncomplicated  EVALUATION COMPLEXITY: Moderate  PLAN:  PT FREQUENCY: 1-2x/week  PT DURATION: 12 weeks  PLANNED INTERVENTIONS: 97164- PT Re-evaluation, 97750- Physical Performance Testing, 97110-Therapeutic exercises, 97530- Therapeutic activity, [id number]- Neuromuscular re-education, 97535- Self Care, [id number]- Manual therapy, [id number]- Gait training, [phone]- Canalith repositioning, [phone]- Electrical stimulation (manual), Patient/Family education, Balance training, Stair training, Joint mobilization, Vestibular training, DME instructions, Cryotherapy, Moist heat, and Biofeedback   PLAN FOR NEXT SESSION:   HEP  expansion.  FGA - eyes closed as appropriate, daily walking, etc. - standing balance focusing on foot flat as pt tends to have posterior lean/wt shift into heels as well as narrow stance     Massie Dollar PT, DPT  Physical Therapist - [facility]  3:47 PM 03/28/24

## 2024-03-30 ENCOUNTER — Ambulatory Visit: Admitting: Physical Therapy

## 2024-03-30 DIAGNOSIS — R262 Difficulty in walking, not elsewhere classified: Secondary | ICD-10-CM

## 2024-03-30 DIAGNOSIS — R2681 Unsteadiness on feet: Secondary | ICD-10-CM

## 2024-03-30 DIAGNOSIS — R269 Unspecified abnormalities of gait and mobility: Secondary | ICD-10-CM

## 2024-03-30 DIAGNOSIS — R531 Weakness: Secondary | ICD-10-CM

## 2024-03-30 NOTE — Therapy (Signed)
 OUTPATIENT PHYSICAL THERAPY NEURO TREATMENT  Patient Name: Adam Castillo MRN: [id number] DOB:Jul 28, 1946, 78 y.o., male Today's Date: 03/30/2024   PCP: Kristina Tinnie POUR, PA-C  REFERRING PROVIDER: Lane Arthea BRAVO, MD   END OF SESSION:   PT End of Session - 03/30/24 1458     Visit Number 22    Number of Visits 24    Date for PT Re-Evaluation 04/04/24    PT Start Time 1535    PT Stop Time 1615    PT Time Calculation (min) 40 min    Equipment Utilized During Treatment Gait belt    Activity Tolerance Patient tolerated treatment well    Behavior During Therapy WFL for tasks assessed/performed             Past Medical History:  Diagnosis Date   Diabetes mellitus without complication (HCC)    Hypertension    Thyroid  disease    Past Surgical History:  Procedure Laterality Date   COLONOSCOPY WITH PROPOFOL      COLONOSCOPY WITH PROPOFOL  N/A 10/15/2021   Procedure: COLONOSCOPY WITH PROPOFOL ;  Surgeon: Therisa Bi, MD;  Location: [facility] ENDOSCOPY;  Service: Gastroenterology;  Laterality: N/A;   HERNIA REPAIR     INSERTION OF MESH  08/27/2021   Procedure: INSERTION OF MESH;  Surgeon: Jordis Laneta FALCON, MD;  Location: [facility] ORS;  Service: General;;   Patient Active Problem List   Diagnosis Date Noted   Hernia of scrotum 11/02/2020   Unilateral recurrent inguinal hernia without obstruction or gangrene 11/02/2020   Stage 3a chronic kidney disease (HCC) 08/14/2020   Annual physical exam 08/02/2020   Abrasion of right hand 08/02/2020   Primary osteoarthritis of right knee 06/12/2020   Hypothyroidism due to Hashimoto's thyroiditis 02/10/2020   Dyslipidemia 02/10/2020   Essential hypertension 02/07/2020    ONSET DATE: Reports steady decline over past 2 years  REFERRING DIAG:  G62.9 (ICD-10-CM) - Polyneuropathy  R26.9 (ICD-10-CM) - Gait abnormality  R26.89 (ICD-10-CM) - Balance disorder    THERAPY DIAG:  Difficulty in walking  Generalized weakness  Unsteadiness on  feet  Abnormality of gait  Rationale for Evaluation and Treatment: Rehabilitation  SUBJECTIVE:                                                                                                                                                                                             SUBJECTIVE STATEMENT:  Patient reports that he is doing well. Was able to perform yard work tasks without issue of mowing lawn.    From Initial Eval: Pt reports steady decline in his balance over the past 2 years after he retired. 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 Pt denies falls, but states he walks like a drunk man. Pt wearing nice dressier shoes today. Pt reports increased difficulty with any household tasks that requires him to stand, due to his balance, but primarily is disappointed he can no longer dance.   Reports it feels like he is walking on sponges from where his feet are numb. Pt states standing in 1 place causes his feet to be more numb.  Pt accompanied by: self  PERTINENT HISTORY: Pt reports hx of hernial operation and prior R hand/wrist fx with no residual deficits, but otherwise healthy PMH: HTN, thyroid  disease, and diabetes  PAIN:  Are you having pain? No  PRECAUTIONS: Fall  RED FLAGS: None   WEIGHT BEARING RESTRICTIONS: No  FALLS: Has patient fallen in last 6 months? No and states he walks like a drunk and stumbles a lot  LIVING ENVIRONMENT: Lives with: lives alone Lives in: House/apartment Stairs: Yes: External: 3 steps; none or can enter through the garage with 1 step-up to enter Has following equipment at home: built in shower seat  PLOF: Independent with basic ADLs, Independent with homemaking with ambulation, and Independent with gait  PATIENT GOALS: prevent his balance from getting worse and ideally go back to work and return to dancing  OBJECTIVE:  Note: Objective measures were completed at  Evaluation unless otherwise noted.  DIAGNOSTIC FINDINGS:  EMG Impression: Abnormal study. 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  Electronically signed by Lane Arthea Locus, MD at 07/24/2023 12:08 PM EST   COGNITION: Overall cognitive status: Within functional limits for tasks assessed   SENSATION: Light touch: Impaired bilaterally with R LE more impaired than L Proprioception: not formally assessed, but appears impaired during functional mobility  COORDINATION: WFL heel to shin  EDEMA:  None observed on eval  MUSCLE TONE: appears WNL  MUSCLE LENGTH: Not formally assessed  DTRs:  Patella: may benefit from testing Achilles: may benefit from testing  POSTURE: rounded shoulders, forward head, and posterior pelvic tilt  LOWER EXTREMITY ROM:     Active   WFL throughout B LEs Right Eval Left Eval  Hip flexion    Hip extension    Hip abduction    Hip adduction    Hip internal rotation    Hip external rotation    Knee flexion    Knee extension    Ankle dorsiflexion    Ankle plantarflexion    Ankle inversion    Ankle eversion     (Blank rows = not tested)  LOWER EXTREMITY MMT:    MMT Right Eval Left Eval  Hip flexion 5 5  Hip extension    Hip abduction    Hip adduction    Hip internal rotation    Hip external rotation    Knee flexion 5 5  Knee extension 5 5  Ankle dorsiflexion 4- 4-  Ankle plantarflexion 4- 4-  Ankle inversion    Ankle eversion    (Blank rows = not tested)  Manual Muscle Test Scale 0/5 = No muscle contraction can be seen or felt 1/5 = Contraction can be felt, but there is no motion 2-/5 = Part moves through incomplete ROM w/ gravity decreased 2/5 = Part moves through complete ROM w/ gravity decreased 2+/5 = Part moves through incomplete ROM (<50%) against gravity or through complete ROM w/ gravity 3-/5 = Part moves  through incomplete ROM (>50%) against  gravity 3/5 = Part moves through complete ROM against gravity 3+/5 = Part moves through complete ROM against gravity/slight resistance 4-/5= Holds test position against slight to moderate pressure 4/5 = Part moves through complete ROM against gravity/moderate resistance 4+/5= Holds test position against moderate to strong pressure 5/5 = Part moves through complete ROM against gravity/full resistance  BED MOBILITY:  Findings: not formally assessed, but not reports of difficulty  TRANSFERS: Sit to stand: CGA  Assistive device utilized: None     Stand to sit: CGA  Assistive device utilized: None     Chair to chair: CGA  Assistive device utilized: None       RAMP:  Not tested, but would benefit from testing  CURB:  Not tested, but would benefit from testing  STAIRS: Need to assess GAIT: Findings: Gait Characteristics: excessive posterior lean throughout with his weight primarily through his heels, step through pattern, decreased step length- Right, decreased step length- Left, decreased stride length, and narrow BOS, Distance walked: ~15ft,  Assistive device utilized:None Level of assistance: CGA Comments: excessive heel strike with lateral bias and pt doesn't transfer weight through midfoot nor forefoot during stance, excessive posterior pelvic tilt throughout with compensatory excessive thoracic rounding to prevent posterior LOB  FUNCTIONAL TESTS:  6 minute walk test: need to assess 10 meter walk test: 0.738m/s Berg Balance Scale: 34/56 Functional gait assessment: need to assess  PATIENT SURVEYS:  ABC scale 56.25% with pt reporting primarily 50% confident with majority of the items including walking around the house - pt reporting he is unsteady with all standing tasks                                                                                                                              TREATMENT DATE: 03/30/2024  Standing at rail:  HS curl x 12 3# AW  Hip abduction x  12 3# AW  Squat with UE support x 12  Hip extension x 12 bil; 3# AW Standing lateral foot tap x 10 bil with eccentric PF to lower from forefoot to flat foot in both directions.  Narrow BOS 2 x 45sec  Semitandem x 40 sec bil  Tandem x 20 sec bil with CGA-min assist to prevent Lateral LOB.  Lunge with UE support x 10  Forward/reverse gait 65ft x 6  Weighted gait with no AD 3# AW x 342ft.    See below for HEP expansion details.   Cues for toe rocker for weighted gait and hip strategy to correct lateral LOB with tandem stance.   PATIENT EDUCATION: Education details: Therapy POC, findings on evaluation today, performance of daily foot assessments, balance systems Pt educated throughout session about proper posture and technique with exercises. Improved exercise technique, movement at target joints, use of target muscles after min to mod verbal, visual, tactile cues. Possible correlation of neuropathy and hx of Lumbar disc disease.   Person educated: Patient  Education method: Explanation Education comprehension: verbalized understanding and needs further education  HOME EXERCISE PROGRAM:  Access Code: [email or web address] URL: [email or web address] Date: 03/30/2024 Prepared by: Massie Dollar  Exercises - Sit to Stand with Counter Support  - 1 x daily - 2-3 x weekly - 2 sets - 10 reps - Seated Ankle Plantar Flexion with Resistance Loop  - 1 x daily - 2-3 x weekly - 2 sets - 10 reps - Seated Ankle Eversion with Anchored Resistance  - 1 x daily - 2-3 x weekly - 2 sets - 10 reps - Backward Walking with Counter Support  - 1 x daily - 2-3 x weekly - 3 sets - 10 reps - Side Lunge with Counter Support  - 1 x daily - 2-3 x weekly - 3 sets - 10 reps - Lunge with Counter Support  - 1 x daily - 2-3 x weekly - 3 sets - 10 reps - Standing Tandem Balance with Unilateral Counter Support  - 1 x daily - 2-3 x weekly - 3 sets - 4 reps - 20 hold - Narrow Stance with Counter Support  - 1 x daily - 3-4 x  weekly - 3 sets - 3 reps - 60 sed  hold - Standing Knee Flexion with Counter Support  - 1 x daily - 2-3 x weekly - 3 sets - 10 reps - Standing March with Counter Support  - 1 x daily - 2-3 x weekly - 3 sets - 10 reps - Standing Hip Abduction with Counter Support  - 1 x daily - 2-3 x weekly - 3 sets - 10 reps - Standing Hip Extension with Counter Support  - 1 x daily - 2-3 x weekly - 3 sets - 10 reps   GOALS: Goals reviewed with patient? Yes  SHORT TERM GOALS: Target date: 02/22/2024  Patient will be independent in home exercise program to improve strength/mobility and balance for better functional independence with ADLs.  Baseline: need to initiate Goal status: INITIAL   LONG TERM GOALS: Target date: 04/04/2024  Patient will increase Berg Balance score to > 51/56 to demonstrate improved balance and decreased fall risk during functional activities and ADLs.  Baseline: 34/56 02/16/24: 40/56 7/9: 43/56  Goal status: PROGRESSING  2.  Patient will increase Functional Gait Assessment (FGA) score to >20/30 as to reduce fall risk and improve dynamic gait safety with community ambulation.  Baseline: 5/30 due to pt frequently reaching for wall support from being unbalanced 02/16/24: 20/30 Goal status: PROGRESSING  3.  Patient will increase 10 meter walk test to >1.78m/s as to improve gait speed for better community ambulation and to reduce fall risk. Baseline: 0.765m/s without AD, CGA 02/16/24: 0.923 m/s without AD, CGA 7/9: Average Fast speed: 1.03 m/s Goal status: PROGRESSING  4.  Patient will increase six minute walk test distance to >1000 for progression to community ambulator and improve gait ability  Baseline:  699 feet having to stop the test at 6min16sec due to fatigue (213 meters, Avg speed 0.67m/s) no AD, with CGA and pt frequently reaching for the wall to support his imbalance 02/16/24: 740' with no AD 7/9: 954ft 5:42 min then rest break required; no AD.  Goal status: PROGRESSING  5.   Patient will increase ABC scale score >80% to demonstrate better functional mobility and better confidence with ADLs.   Baseline: 56.25% 02/16/24: 70.63% 7/9: 81.25% Goal status: MET   ASSESSMENT:  CLINICAL IMPRESSION:   Pt arrived motivated to participate. PT treatment instructed pt in  HEP advancement in preparation for d/c pt demonstrates understanding of each exercises. Hand out provided. Cues for proper support to reduce fall risk and proper repetitions for strengthening movements.  Pt will continue to benefit from skilled therapy to address remaining deficits in order to improve overall QoL and return to PLOF.      OBJECTIVE IMPAIRMENTS: Abnormal gait, decreased balance, decreased knowledge of condition, decreased knowledge of use of DME, decreased mobility, difficulty walking, decreased strength, impaired sensation, and improper body mechanics.   ACTIVITY LIMITATIONS: carrying, lifting, bending, standing, squatting, stairs, transfers, bathing, toileting, dressing, hygiene/grooming, and locomotion level  PARTICIPATION LIMITATIONS: meal prep, cleaning, laundry, shopping, community activity, occupation, yard work, and dancing  PERSONAL FACTORS: Age, Time since onset of injury/illness/exacerbation, and 3+ comorbidities: HTN, thyroid  disease, and diabetes are also affecting patient's functional outcome.   REHAB POTENTIAL: Good  CLINICAL DECISION MAKING: Stable/uncomplicated  EVALUATION COMPLEXITY: Moderate  PLAN:  PT FREQUENCY: 1-2x/week  PT DURATION: 12 weeks  PLANNED INTERVENTIONS: 97164- PT Re-evaluation, 97750- Physical Performance Testing, 97110-Therapeutic exercises, 97530- Therapeutic activity, [id number]- Neuromuscular re-education, 97535- Self Care, [id number]- Manual therapy, [id number]- Gait training, [phone]- Canalith repositioning, [phone]- Electrical stimulation (manual), Patient/Family education, Balance training, Stair training, Joint mobilization, Vestibular training, DME  instructions, Cryotherapy, Moist heat, and Biofeedback   PLAN FOR NEXT SESSION:   D/c assessment.      Massie Dollar PT, DPT  Physical Therapist - [facility]  3:02 PM 03/30/24

## 2024-04-01 ENCOUNTER — Ambulatory Visit (INDEPENDENT_AMBULATORY_CARE_PROVIDER_SITE_OTHER): Admitting: Physician Assistant

## 2024-04-01 ENCOUNTER — Encounter: Payer: Self-pay | Admitting: Physician Assistant

## 2024-04-01 VITALS — BP 170/83 | HR 80 | Temp 98.3°F | Resp 16 | Ht 69.0 in | Wt 198.0 lb

## 2024-04-01 DIAGNOSIS — I10 Essential (primary) hypertension: Secondary | ICD-10-CM

## 2024-04-01 DIAGNOSIS — E1165 Type 2 diabetes mellitus with hyperglycemia: Secondary | ICD-10-CM

## 2024-04-01 DIAGNOSIS — N1831 Chronic kidney disease, stage 3a: Secondary | ICD-10-CM | POA: Diagnosis not present

## 2024-04-01 DIAGNOSIS — Z0000 Encounter for general adult medical examination without abnormal findings: Secondary | ICD-10-CM

## 2024-04-01 LAB — POCT GLYCOSYLATED HEMOGLOBIN (HGB A1C): Hemoglobin A1C: 6.1 % — AB (ref 4.0–5.6)

## 2024-04-01 MED ORDER — AMLODIPINE BESYLATE 2.5 MG PO TABS: 2.5000 mg | ORAL_TABLET | Freq: Every day | ORAL | 3 refills | Status: DC

## 2024-04-01 NOTE — Progress Notes (Deleted)
 [facility] [address] [place], [place] [address]  Internal MEDICINE  Office Visit Note  Patient Name: Adam Castillo  [id number]  [id number]  Date of Service: 04/01/2024  Chief Complaint  Patient presents with   Medicare Wellness   Diabetes   Hypertension    HPI Pt is here for routine follow up -BG at home 130s in AM on avg, PM fluctuate much more based on foods he has eaten -BP before bed 130-150/80s -lasix  down to 20mg , no ankle swelling or increased SOB -kidney function continues to improve with decrease lasix  -sleeping well 147/70--hold lasix  and potassium and start norvasc  Current Medication: Outpatient Encounter Medications as of 04/01/2024  Medication Sig   Alpha-Lipoic Acid 100 MG CAPS Take 1 capsule (100 mg total) by mouth daily.   aspirin EC 81 MG tablet Take 81 mg by mouth daily.   b complex vitamins capsule Take 1 capsule by mouth daily.   cholecalciferol (VITAMIN D3) 25 MCG (1000 UNIT) tablet Take 1,000 Units by mouth daily.   furosemide  (LASIX ) 20 MG tablet Take 1 tablet (20 mg total) by mouth daily.   gabapentin  (NEURONTIN ) 100 MG capsule Take 100 mg by mouth daily.   levothyroxine  (SYNTHROID ) 50 MCG tablet TAKE 1 TABLET BY MOUTH EVERY DAY   lisinopril  (ZESTRIL ) 40 MG tablet TAKE 1 TABLET BY MOUTH EVERY DAY   metFORMIN  (GLUCOPHAGE ) 500 MG tablet Take 1 tablet (500 mg total) by mouth 2 (two) times daily with a meal.   Multiple Vitamins-Minerals (CENTRUM SILVER 50+MEN PO) Take 1 tablet by mouth daily.   potassium chloride (MICRO-K) 10 MEQ CR capsule Take 10 mEq by mouth daily.   simvastatin  (ZOCOR ) 20 MG tablet Take 1 tablet (20 mg total) by mouth daily.   No facility-administered encounter medications on file as of 04/01/2024.    Surgical History: Past Surgical History:  Procedure Laterality Date   COLONOSCOPY WITH PROPOFOL      COLONOSCOPY WITH PROPOFOL  N/A 10/15/2021   Procedure: COLONOSCOPY WITH PROPOFOL ;  Surgeon: Therisa Bi, MD;   Location: [facility] ENDOSCOPY;  Service: Gastroenterology;  Laterality: N/A;   HERNIA REPAIR     INSERTION OF MESH  08/27/2021   Procedure: INSERTION OF MESH;  Surgeon: Jordis Laneta FALCON, MD;  Location: [facility] ORS;  Service: General;;    Medical History: Past Medical History:  Diagnosis Date   Diabetes mellitus without complication (HCC)    Hypertension    Thyroid  disease     Family History: Family History  Problem Relation Age of Onset   COPD Mother    Heart disease Father     Social History   Socioeconomic History   Marital status: Single    Spouse name: Not on file   Number of children: Not on file   Years of education: Not on file   Highest education level: Not on file  Occupational History   Not on file  Tobacco Use   Smoking status: Never   Smokeless tobacco: Never  Vaping Use   Vaping status: Never Used  Substance and Sexual Activity   Alcohol use: Never   Drug use: Never   Sexual activity: Not on file  Other Topics Concern   Not on file  Social History Narrative   Not on file   Social Drivers of Health   Financial Resource Strain: Low Risk  (10/19/2023)   Received from [facility]   Overall Financial Resource Strain (CARDIA)    Difficulty of Paying Living Expenses: Not very  hard  Food Insecurity: No Food Insecurity (10/19/2023)   Received from [facility]   Hunger Vital Sign    Within the past 12 months, you worried that your food would run out before you got the money to buy more.: Never true    Within the past 12 months, the food you bought just didn't last and you didn't have money to get more.: Never true  Transportation Needs: No Transportation Needs (10/19/2023)   Received from [facility] - Transportation    In the past 12 months, has lack of transportation kept you from medical appointments or from getting medications?: No    Lack of Transportation (Non-Medical): No  Physical Activity:  Inactive (08/11/2022)   Exercise Vital Sign    Days of Exercise per Week: 0 days    Minutes of Exercise per Session: 0 min  Stress: No Stress Concern Present (08/11/2022)   [facility] [employer] - Occupational Stress Questionnaire    Feeling of Stress : Only a little  Social Connections: Socially Isolated (08/11/2022)   Social Connection and Isolation Panel    Frequency of Communication with Friends and Family: Twice a week    Frequency of Social Gatherings with Friends and Family: Once a week    Attends Religious Services: Never    Database administrator or Organizations: No    Attends Banker Meetings: Never    Marital Status: Divorced  Catering manager Violence: Not At Risk (08/11/2022)   Humiliation, Afraid, Rape, and Kick questionnaire    Fear of Current or Ex-Partner: No    Emotionally Abused: No    Physically Abused: No    Sexually Abused: No      Review of Systems  Vital Signs: BP (!) 170/83   Pulse 80   Temp 98.3 F (36.8 C)   Resp 16   Ht 5' 9 (1.753 m)   Wt 198 lb (89.8 kg)   SpO2 98%   BMI 29.24 kg/m    Physical Exam     Assessment/Plan:   General Counseling: Adam Castillo verbalizes understanding of the findings of todays visit and agrees with plan of treatment. I have discussed any further diagnostic evaluation that may be needed or ordered today. We also reviewed his medications today. he has been encouraged to call the office with any questions or concerns that should arise related to todays visit.    Orders Placed This Encounter  Procedures   POCT HgB A1C    No orders of the defined types were placed in this encounter.   This patient was seen by Tinnie Pro, PA-C in collaboration with Dr. Sigrid Bathe as a part of collaborative care agreement.   Total time spent:*** Minutes Time spent includes review of chart, medications, test results, and follow up plan with the patient.      Dr Fozia M Khan Internal  medicine

## 2024-04-01 NOTE — Progress Notes (Signed)
 [facility] [address] [place], [place] [address]  Internal MEDICINE  Office Visit Note  Patient Name: Adam Castillo  [id number]  [id number]  Date of Service: 04/01/2024  Chief Complaint  Patient presents with   Medicare Wellness   Diabetes   Hypertension    HPI Keyden presents for an annual well visit  Well-appearing 78 y.o. male Labs: reviewed, kidney function continues to improve with decrease lasix  -lasix  down to 20mg , no ankle swelling or increased SOB. Will trial holding lasix /potassium and starting norvasc  for BP. Advised to monitor closely and call if any concerns -BG at home 130s in AM on avg, PM fluctuate much more based on foods he has eaten -BP before bed 130-150/80s -sleeping well     04/01/2024    9:47 AM  MMSE - Mini Mental State Exam  Orientation to time 5  Orientation to Place 5  Registration 3  Attention/ Calculation 5  Recall 3  Language- name 2 objects 2  Language- repeat 1  Language- follow 3 step command 3  Language- read & follow direction 1  Write a sentence 1  Copy design 1  Total score 30    Functional Status Survey: Is the patient deaf or have difficulty hearing?: No Does the patient have difficulty seeing, even when wearing glasses/contacts?: No Does the patient have difficulty concentrating, remembering, or making decisions?: No Does the patient have difficulty walking or climbing stairs?: No Does the patient have difficulty dressing or bathing?: No Does the patient have difficulty doing errands alone such as visiting a doctor's office or shopping?: No     11/11/2021   11:08 AM 05/12/2022    8:55 AM 08/11/2022   11:11 AM 12/21/2023   10:31 AM 04/01/2024    9:46 AM  Fall Risk  Falls in the past year? 0 0 0 0 0  Was there an injury with Fall? 0 0 0    Fall Risk Category Calculator 0 0 0    Fall Risk Category (Retired) Low  Low  Low     (RETIRED) Patient Fall Risk Level Low fall risk  Low fall risk  Low fall risk      Patient at Risk for Falls Due to No Fall Risks No Fall Risks No Fall Risks    Fall risk Follow up Falls evaluation completed  Falls evaluation completed  Falls prevention discussed;Falls evaluation completed        Data saved with a previous flowsheet row definition       04/01/2024    9:46 AM  Depression screen PHQ 2/9  Decreased Interest 0  Down, Depressed, Hopeless 0  PHQ - 2 Score 0        No data to display            Current Medication: Outpatient Encounter Medications as of 04/01/2024  Medication Sig   Alpha-Lipoic Acid 100 MG CAPS Take 1 capsule (100 mg total) by mouth daily.   amLODipine  (NORVASC ) 2.5 MG tablet Take 1 tablet (2.5 mg total) by mouth daily.   aspirin EC 81 MG tablet Take 81 mg by mouth daily.   b complex vitamins capsule Take 1 capsule by mouth daily.   cholecalciferol (VITAMIN D3) 25 MCG (1000 UNIT) tablet Take 1,000 Units by mouth daily.   furosemide  (LASIX ) 20 MG tablet Take 1 tablet (20 mg total) by mouth daily.   gabapentin  (NEURONTIN ) 100 MG capsule Take 100 mg by mouth daily.   levothyroxine  (SYNTHROID ) 50 MCG tablet  TAKE 1 TABLET BY MOUTH EVERY DAY   lisinopril  (ZESTRIL ) 40 MG tablet TAKE 1 TABLET BY MOUTH EVERY DAY   metFORMIN  (GLUCOPHAGE ) 500 MG tablet Take 1 tablet (500 mg total) by mouth 2 (two) times daily with a meal.   Multiple Vitamins-Minerals (CENTRUM SILVER 50+MEN PO) Take 1 tablet by mouth daily.   potassium chloride (MICRO-K) 10 MEQ CR capsule Take 10 mEq by mouth daily.   simvastatin  (ZOCOR ) 20 MG tablet Take 1 tablet (20 mg total) by mouth daily.   No facility-administered encounter medications on file as of 04/01/2024.    Surgical History: Past Surgical History:  Procedure Laterality Date   COLONOSCOPY WITH PROPOFOL      COLONOSCOPY WITH PROPOFOL  N/A 10/15/2021   Procedure: COLONOSCOPY WITH PROPOFOL ;  Surgeon: Therisa Bi, MD;  Location[facility] ENDOSCOPY;  Service: Gastroenterology;  Laterality: N/A;   HERNIA REPAIR      INSERTION OF MESH  08/27/2021   Procedure: INSERTION OF MESH;  Surgeon: Jordis Laneta FALCON, MD;  Location: [facility] ORS;  Service: General;;    Medical History: Past Medical History:  Diagnosis Date   Diabetes mellitus without complication (HCC)    Hypertension    Thyroid  disease     Family History: Family History  Problem Relation Age of Onset   COPD Mother    Heart disease Father     Social History   Socioeconomic History   Marital status: Single    Spouse name: Not on file   Number of children: Not on file   Years of education: Not on file   Highest education level: Not on file  Occupational History   Not on file  Tobacco Use   Smoking status: Never   Smokeless tobacco: Never  Vaping Use   Vaping status: Never Used  Substance and Sexual Activity   Alcohol use: Never   Drug use: Never   Sexual activity: Not on file  Other Topics Concern   Not on file  Social History Narrative   Not on file   Social Drivers of Health   Financial Resource Strain: Low Risk  (10/19/2023)   Received from [facility]   Overall Financial Resource Strain (CARDIA)    Difficulty of Paying Living Expenses: Not very hard  Food Insecurity: No Food Insecurity (10/19/2023)   Received from [facility]   Hunger Vital Sign    Within the past 12 months, you worried that your food would run out before you got the money to buy more.: Never true    Within the past 12 months, the food you bought just didn't last and you didn't have money to get more.: Never true  Transportation Needs: No Transportation Needs (10/19/2023)   Received from [facility] - Transportation    In the past 12 months, has lack of transportation kept you from medical appointments or from getting medications?: No    Lack of Transportation (Non-Medical): No  Physical Activity: Inactive (08/11/2022)   Exercise Vital Sign    Days of Exercise per Week: 0 days    Minutes of  Exercise per Session: 0 min  Stress: No Stress Concern Present (08/11/2022)   [employer] of Occupational Health - Occupational Stress Questionnaire    Feeling of Stress : Only a little  Social Connections: Socially Isolated (08/11/2022)   Social Connection and Isolation Panel    Frequency of Communication with Friends and Family: Twice a week    Frequency of Social  Gatherings with Friends and Family: Once a week    Attends Religious Services: Never    Database administrator or Organizations: No    Attends Banker Meetings: Never    Marital Status: Divorced  Catering manager Violence: Not At Risk (08/11/2022)   Humiliation, Afraid, Rape, and Kick questionnaire    Fear of Current or Ex-Partner: No    Emotionally Abused: No    Physically Abused: No    Sexually Abused: No      Review of Systems  Constitutional:  Negative for chills, fatigue and unexpected weight change.  HENT:  Positive for postnasal drip. Negative for congestion, rhinorrhea, sneezing and sore throat.   Eyes:  Negative for redness.  Respiratory:  Negative for cough, chest tightness and wheezing.        SOBOE only  Cardiovascular:  Negative for chest pain and palpitations.  Gastrointestinal:  Negative for abdominal pain, constipation, diarrhea, nausea and vomiting.  Genitourinary:  Negative for dysuria and frequency.  Musculoskeletal:  Positive for gait problem. Negative for arthralgias, back pain, joint swelling and neck pain.  Skin:  Negative for rash.  Neurological:  Positive for numbness. Negative for tremors.       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 Does not bruise/bleed easily.  Psychiatric/Behavioral:  Negative for behavioral problems (Depression), sleep disturbance and suicidal ideas. The patient is not nervous/anxious.     Vital Signs: BP (!) 147/70 Comment: 170/83  Pulse 80   Temp 98.3 F (36.8 C)   Resp 16   Ht 5' 9 (1.753 m)   Wt 198 lb  (89.8 kg)   SpO2 98%   BMI 29.24 kg/m    Physical Exam Vitals and nursing note reviewed.  Constitutional:      General: He is not in acute distress.    Appearance: He is well-developed. He is not diaphoretic.  HENT:     Head: Normocephalic and atraumatic.  Eyes:     Extraocular Movements: Extraocular movements intact.  Neck:     Thyroid : No thyromegaly.     Vascular: No JVD.     Trachea: No tracheal deviation.  Cardiovascular:     Rate and Rhythm: Normal rate and regular rhythm.     Heart sounds: Normal heart sounds. No murmur heard.    No friction rub. No gallop.  Pulmonary:     Effort: Pulmonary effort is normal. No respiratory distress.     Breath sounds: No wheezing or rales.  Chest:     Chest wall: No tenderness.  Musculoskeletal:     Right lower leg: No edema.     Left lower leg: No edema.  Skin:    General: Skin is warm and dry.  Neurological:     Mental Status: He is alert and oriented to person, place, and time.  Psychiatric:        Behavior: Behavior normal.        Thought Content: Thought content normal.        Judgment: Judgment normal.        Assessment/Plan: 1. Encounter for annual wellness exam in Medicare patient (Primary) AWV performed, labs reviewed  2. Type 2 diabetes mellitus with hyperglycemia, without long-term current use of insulin (HCC) - POCT HgB A1C is 6.1 which is significantly improved from 7.0 last check, continue current medication and monitoring--may be able to decrease metformin  if staying well controlled in future  3. Stage 3a chronic kidney disease (HCC) Renal function  improving, will recheck labs in a few weeks after holding lasix  and potassium completely - Basic Metabolic Panel (BMET)  4. Essential hypertension Will start low dose amlodipine  to help BP with stopping lasix . Continue lisinopril  as before. Call if any concerns - amLODipine  (NORVASC ) 2.5 MG tablet; Take 1 tablet (2.5 mg total) by mouth daily.  Dispense: 90  tablet; Refill: 3     General Counseling: Cason verbalizes understanding of the findings of todays visit and agrees with plan of treatment. I have discussed any further diagnostic evaluation that may be needed or ordered today. We also reviewed his medications today. he has been encouraged to call the office with any questions or concerns that should arise related to todays visit.    Orders Placed This Encounter  Procedures   Basic Metabolic Panel (BMET)   POCT HgB A1C    Meds ordered this encounter  Medications   amLODipine  (NORVASC ) 2.5 MG tablet    Sig: Take 1 tablet (2.5 mg total) by mouth daily.    Dispense:  90 tablet    Refill:  3    Return in about 4 weeks (around 04/29/2024) for lab review.   Total time spent:35 Minutes Time spent includes review of chart, medications, test results, and follow up plan with the patient.   [place] Controlled Substance Database was reviewed by me.  This patient was seen by Tinnie Pro, PA-C in collaboration with Dr. Sigrid Bathe as a part of collaborative care agreement.  Tinnie Pro, PA-C Internal medicine

## 2024-04-04 ENCOUNTER — Ambulatory Visit: Admitting: Physical Therapy

## 2024-04-04 DIAGNOSIS — R269 Unspecified abnormalities of gait and mobility: Secondary | ICD-10-CM

## 2024-04-04 DIAGNOSIS — R2681 Unsteadiness on feet: Secondary | ICD-10-CM

## 2024-04-04 DIAGNOSIS — R262 Difficulty in walking, not elsewhere classified: Secondary | ICD-10-CM | POA: Diagnosis not present

## 2024-04-04 DIAGNOSIS — R531 Weakness: Secondary | ICD-10-CM

## 2024-04-04 NOTE — Therapy (Signed)
 OUTPATIENT PHYSICAL THERAPY NEURO TREATMENT/  D/C assessment   Patient Name: Adam Castillo MRN: [id number] DOB:10-29-1945, 78 y.o., male Today's Date: 04/04/2024   PCP: Kristina Tinnie POUR, PA-C  REFERRING PROVIDER: Lane Arthea BRAVO, MD   END OF SESSION:   PT End of Session - 04/04/24 1258     Visit Number 23    Number of Visits 24    Date for PT Re-Evaluation 04/04/24    PT Start Time 1315    PT Stop Time 1355    PT Time Calculation (min) 40 min    Equipment Utilized During Treatment Gait belt    Activity Tolerance Patient tolerated treatment well    Behavior During Therapy WFL for tasks assessed/performed             Past Medical History:  Diagnosis Date   Diabetes mellitus without complication (HCC)    Hypertension    Thyroid  disease    Past Surgical History:  Procedure Laterality Date   COLONOSCOPY WITH PROPOFOL      COLONOSCOPY WITH PROPOFOL  N/A 10/15/2021   Procedure: COLONOSCOPY WITH PROPOFOL ;  Surgeon: Therisa Bi, MD;  Location: [facility] ENDOSCOPY;  Service: Gastroenterology;  Laterality: N/A;   HERNIA REPAIR     INSERTION OF MESH  08/27/2021   Procedure: INSERTION OF MESH;  Surgeon: Jordis Laneta FALCON, MD;  Location: [facility] ORS;  Service: General;;   Patient Active Problem List   Diagnosis Date Noted   Hernia of scrotum 11/02/2020   Unilateral recurrent inguinal hernia without obstruction or gangrene 11/02/2020   Stage 3a chronic kidney disease (HCC) 08/14/2020   Annual physical exam 08/02/2020   Abrasion of right hand 08/02/2020   Primary osteoarthritis of right knee 06/12/2020   Hypothyroidism due to Hashimoto's thyroiditis 02/10/2020   Dyslipidemia 02/10/2020   Essential hypertension 02/07/2020    ONSET DATE: Reports steady decline over past 2 years  REFERRING DIAG:  G62.9 (ICD-10-CM) - Polyneuropathy  R26.9 (ICD-10-CM) - Gait abnormality  R26.89 (ICD-10-CM) - Balance disorder    THERAPY DIAG:  Difficulty in walking  Generalized  weakness  Unsteadiness on feet  Abnormality of gait  Rationale for Evaluation and Treatment: Rehabilitation  SUBJECTIVE:                                                                                                                                                                                             SUBJECTIVE STATEMENT:  Patient reports that he is doing well. No pain to report. States that he had a good weekend. Will have follow-up with MD on 7/23 with Dr Lane. States that he feels ready to d/c from PT  at this time. Reports that numbness and weakness are still present in BLE, but overall, he is moving better and feels like his balance has improved through PT treatment.     From Initial Eval: Pt reports steady decline in his balance over the past 2 years after he retired. 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 Pt denies falls, but states he walks like a drunk man. Pt wearing nice dressier shoes today. Pt reports increased difficulty with any household tasks that requires him to stand, due to his balance, but primarily is disappointed he can no longer dance.   Reports it feels like he is walking on sponges from where his feet are numb. Pt states standing in 1 place causes his feet to be more numb.  Pt accompanied by: self  PERTINENT HISTORY: Pt reports hx of hernial operation and prior R hand/wrist fx with no residual deficits, but otherwise healthy PMH: HTN, thyroid  disease, and diabetes  PAIN:  Are you having pain? No  PRECAUTIONS: Fall  RED FLAGS: None   WEIGHT BEARING RESTRICTIONS: No  FALLS: Has patient fallen in last 6 months? No and states he walks like a drunk and stumbles a lot  LIVING ENVIRONMENT: Lives with: lives alone Lives in: House/apartment Stairs: Yes: External: 3 steps; none or can enter through the garage with 1 step-up to enter Has following equipment at home: built in shower  seat  PLOF: Independent with basic ADLs, Independent with homemaking with ambulation, and Independent with gait  PATIENT GOALS: prevent his balance from getting worse and ideally go back to work and return to dancing  OBJECTIVE:  Note: Objective measures were completed at Evaluation unless otherwise noted.  DIAGNOSTIC FINDINGS:  EMG Impression: Abnormal study. 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  Electronically signed by Lane Arthea Locus, MD at 07/24/2023 12:08 PM EST   COGNITION: Overall cognitive status: Within functional limits for tasks assessed   SENSATION: Light touch: Impaired bilaterally with R LE more impaired than L Proprioception: not formally assessed, but appears impaired during functional mobility  COORDINATION: WFL heel to shin  EDEMA:  None observed on eval  MUSCLE TONE: appears WNL  MUSCLE LENGTH: Not formally assessed  DTRs:  Patella: may benefit from testing Achilles: may benefit from testing  POSTURE: rounded shoulders, forward head, and posterior pelvic tilt  LOWER EXTREMITY ROM:     Active   WFL throughout B LEs Right Eval Left Eval  Hip flexion    Hip extension    Hip abduction    Hip adduction    Hip internal rotation    Hip external rotation    Knee flexion    Knee extension    Ankle dorsiflexion    Ankle plantarflexion    Ankle inversion    Ankle eversion     (Blank rows = not tested)  LOWER EXTREMITY MMT:    MMT Right Eval Left Eval  Hip flexion 5 5  Hip extension    Hip abduction    Hip adduction    Hip internal rotation    Hip external rotation    Knee flexion 5 5  Knee extension 5 5  Ankle dorsiflexion 4- 4-  Ankle plantarflexion 4- 4-  Ankle inversion    Ankle eversion    (Blank rows = not tested)  Manual Muscle Test Scale 0/5 = No muscle contraction can be seen or  felt 1/5 = Contraction can be felt, but there is no motion 2-/5  = Part moves through incomplete ROM w/ gravity decreased 2/5 = Part moves through complete ROM w/ gravity decreased 2+/5 = Part moves through incomplete ROM (<50%) against gravity or through complete ROM w/ gravity 3-/5 = Part moves through incomplete ROM (>50%) against gravity 3/5 = Part moves through complete ROM against gravity 3+/5 = Part moves through complete ROM against gravity/slight resistance 4-/5= Holds test position against slight to moderate pressure 4/5 = Part moves through complete ROM against gravity/moderate resistance 4+/5= Holds test position against moderate to strong pressure 5/5 = Part moves through complete ROM against gravity/full resistance  BED MOBILITY:  Findings: not formally assessed, but not reports of difficulty  TRANSFERS: Sit to stand: CGA  Assistive device utilized: None     Stand to sit: CGA  Assistive device utilized: None     Chair to chair: CGA  Assistive device utilized: None       RAMP:  Not tested, but would benefit from testing  CURB:  Not tested, but would benefit from testing  STAIRS: Need to assess GAIT: Findings: Gait Characteristics: excessive posterior lean throughout with his weight primarily through his heels, step through pattern, decreased step length- Right, decreased step length- Left, decreased stride length, and narrow BOS, Distance walked: ~164ft,  Assistive device utilized:None Level of assistance: CGA Comments: excessive heel strike with lateral bias and pt doesn't transfer weight through midfoot nor forefoot during stance, excessive posterior pelvic tilt throughout with compensatory excessive thoracic rounding to prevent posterior LOB  FUNCTIONAL TESTS:  6 minute walk test: need to assess 10 meter walk test: 0.760m/s Berg Balance Scale: 34/56 Functional gait assessment: need to assess  PATIENT SURVEYS:  ABC scale 56.25% with pt reporting primarily 50% confident with majority of the items including walking around the  house - pt reporting he is unsteady with all standing tasks                                                                                                                              TREATMENT DATE: 04/04/2024  PT treatment focused on Goal assessment for D/c   Patient demonstrates increased fall risk as noted by score of   45/56 on Berg Balance Scale.  (<36= high risk for falls, close to 100%; 37-45 significant >80%; 46-51 moderate >50%; 52-55 lower >25%)   10 Meter Walk Test: Patient instructed to walk 10 meters (32.8 ft) as quickly and as safely as possible at their normal speed x2 and at a fast speed x2. Time measured from 2 meter mark to 8 meter mark to accommodate ramp-up and ramp-down.   Average Fast speed: 1.05 m/s Cut off scores: <0.4 m/s = household Ambulator, 0.4-0.8 m/s = limited community Ambulator, >0.8 m/s = community Ambulator, >1.2 m/s = crossing a street, <1.0 = increased fall risk MCID 0.05 m/s (small), 0.13 m/s (moderate), 0.06 m/s (significant)  (  ANPTA Core Set of Outcome Measures for Adults with Neurologic Conditions, 2018)  6 Min Walk Test:  Instructed patient to ambulate as quickly and as safely as possible for 6 minutes using LRAD. Patient was allowed to take standing rest breaks without stopping the test, but if the patient required a sitting rest break the clock would be stopped and the test would be over.  Results: 1022ft with no AD. No rest break required.  But noted to a have significant SOB upon completion. Results indicate that the patient  slightly reduced endurance with ambulation compared to age matched norms.  Age Matched Norms: 34-77 yo M: 44 F: 28, 61-79 yo M: 64 F: 471, 47-89 yo M: 417 F: 392 MDC: 58.21 meters (190.98 feet) or 50 meters (ANPTA Core Set of Outcome Measures for Adults with Neurologic Conditions, 2018)  Patient demonstrates increased fall risk as noted by score of 21/30 on  Functional Gait Assessment.   <22/30 = predictive of falls,  <20/30 = fall in 6 months, <18/30 = predictive of falls in PD MCID: 5 points stroke population, 4 points geriatric population (ANPTA Core Set of Outcome Measures for Adults with Neurologic Conditions, 2018)   PATIENT EDUCATION: Education details: Therapy POC, findings on evaluation today, performance of daily foot assessments, balance systems Pt educated throughout session about proper posture and technique with exercises. Improved exercise technique, movement at target joints, use of target muscles after min to mod verbal, visual, tactile cues. Possible correlation of neuropathy and hx of Lumbar disc disease.   Person educated: Patient Education method: Explanation Education comprehension: verbalized understanding and needs further education  HOME EXERCISE PROGRAM:  Access Code: [email or web address] URL: [email or web address] Date: 03/30/2024 Prepared by: Massie Dollar  Exercises - Sit to Stand with Counter Support  - 1 x daily - 2-3 x weekly - 2 sets - 10 reps - Seated Ankle Plantar Flexion with Resistance Loop  - 1 x daily - 2-3 x weekly - 2 sets - 10 reps - Seated Ankle Eversion with Anchored Resistance  - 1 x daily - 2-3 x weekly - 2 sets - 10 reps - Backward Walking with Counter Support  - 1 x daily - 2-3 x weekly - 3 sets - 10 reps - Side Lunge with Counter Support  - 1 x daily - 2-3 x weekly - 3 sets - 10 reps - Lunge with Counter Support  - 1 x daily - 2-3 x weekly - 3 sets - 10 reps - Standing Tandem Balance with Unilateral Counter Support  - 1 x daily - 2-3 x weekly - 3 sets - 4 reps - 20 hold - Narrow Stance with Counter Support  - 1 x daily - 3-4 x weekly - 3 sets - 3 reps - 60 sed  hold - Standing Knee Flexion with Counter Support  - 1 x daily - 2-3 x weekly - 3 sets - 10 reps - Standing March with Counter Support  - 1 x daily - 2-3 x weekly - 3 sets - 10 reps - Standing Hip Abduction with Counter Support  - 1 x daily - 2-3 x weekly - 3 sets - 10 reps - Standing Hip  Extension with Counter Support  - 1 x daily - 2-3 x weekly - 3 sets - 10 reps   GOALS: Goals reviewed with patient? Yes  SHORT TERM GOALS: Target date: 02/22/2024  Patient will be independent in home exercise program to improve strength/mobility and balance for better functional independence with ADLs.  Baseline: need to initiate 7/21: expanded on 7/16 Goal status: MET   LONG TERM GOALS: Target date: 04/04/2024  Patient will increase Berg Balance score to > 51/56 to demonstrate improved balance and decreased fall risk during functional activities and ADLs.  Baseline: 34/56 02/16/24: 40/56 7/9: 43/56  7/21: 45/56 Goal status: PROGRESSING  2.  Patient will increase Functional Gait Assessment (FGA) score to >20/30 as to reduce fall risk and improve dynamic gait safety with community ambulation.  Baseline: 5/30 due to pt frequently reaching for wall support from being unbalanced 02/16/24: 20/30 7/21: 21/30 Goal status: MET  3.  Patient will increase 10 meter walk test to >1.79m/s as to improve gait speed for better community ambulation and to reduce fall risk. Baseline: 0.749m/s without AD, CGA 02/16/24: 0.923 m/s without AD, CGA 7/9: Average Fast speed: 1.03 m/s 7/21: 1.3m/s  Goal status: MET  4.  Patient will increase six minute walk test distance to >1000 for progression to community ambulator and improve gait ability  Baseline:  699 feet having to stop the test at 8min16sec due to fatigue (213 meters, Avg speed 0.31m/s) no AD, with CGA and pt frequently reaching for the wall to support his imbalance 02/16/24: 740' with no AD 7/9: 971ft 5:42 min then rest break required; no AD 7/21: 1062ft with no AD. No rest break required.  Goal status: Met  5.  Patient will increase ABC scale score >80% to demonstrate better functional mobility and better confidence with ADLs.   Baseline: 56.25% 02/16/24: 70.63% 7/9: 81.25%  7/21: 91.25% Goal status: MET   ASSESSMENT:  CLINICAL IMPRESSION:    Pt arrived motivated to participate. Ready for d/c from PT. HEP provided on last session. Pt demonstrated improved safety and function as well as reduced fall risk with increased confidence with balance tasks noted in ABC. Has met 4 of 5 LTG; Improved Berg to 45/56, increased FGA  to >20/30. Increased 6 min walk test >1070ft, as well as maintain gait speed >1.0 m/s. Due to reduced fall risk and reported improved confidence with mobility tasks, pt will no longer require Skilled PT at this time.     OBJECTIVE IMPAIRMENTS: Abnormal gait, decreased balance, decreased knowledge of condition, decreased knowledge of use of DME, decreased mobility, difficulty walking, decreased strength, impaired sensation, and improper body mechanics.   ACTIVITY LIMITATIONS: carrying, lifting, bending, standing, squatting, stairs, transfers, bathing, toileting, dressing, hygiene/grooming, and locomotion level  PARTICIPATION LIMITATIONS: meal prep, cleaning, laundry, shopping, community activity, occupation, yard work, and dancing  PERSONAL FACTORS: Age, Time since onset of injury/illness/exacerbation, and 3+ comorbidities: HTN, thyroid  disease, and diabetes are also affecting patient's functional outcome.   REHAB POTENTIAL: Good  CLINICAL DECISION MAKING: Stable/uncomplicated  EVALUATION COMPLEXITY: Moderate  PLAN:  PT FREQUENCY: 1-2x/week  PT DURATION: 12 weeks  PLANNED INTERVENTIONS: 97164- PT Re-evaluation, 97750- Physical Performance Testing, 97110-Therapeutic exercises, 97530- Therapeutic activity, [id number]- Neuromuscular re-education, 97535- Self Care, 02859- Manual therapy, [id number]- Gait training, [phone]- Canalith repositioning, [phone]- Electrical stimulation (manual), Patient/Family education, Balance training, Stair training, Joint mobilization, Vestibular training, DME instructions, Cryotherapy, Moist heat, and Biofeedback   PLAN FOR NEXT SESSION:   N.a.      Massie Dollar PT, DPT  Physical  Therapist - [facility]  12:59 PM 04/04/24

## 2024-04-05 LAB — PULMONARY FUNCTION TEST

## 2024-04-06 ENCOUNTER — Telehealth: Payer: Self-pay

## 2024-04-06 ENCOUNTER — Other Ambulatory Visit: Payer: Self-pay

## 2024-04-06 ENCOUNTER — Ambulatory Visit: Admitting: Physical Therapy

## 2024-04-06 MED ORDER — LISINOPRIL 40 MG PO TABS: 40.0000 mg | ORAL_TABLET | Freq: Every day | ORAL | 3 refills | Status: DC

## 2024-04-06 MED ORDER — LEVOTHYROXINE SODIUM 50 MCG PO TABS: 50.0000 ug | ORAL_TABLET | Freq: Every day | ORAL | 0 refills | Status: DC

## 2024-04-06 NOTE — Telephone Encounter (Signed)
 Lmom that we sent both mediation

## 2024-04-11 ENCOUNTER — Ambulatory Visit: Admitting: Physical Therapy

## 2024-04-13 ENCOUNTER — Ambulatory Visit: Admitting: Physical Therapy

## 2024-04-18 ENCOUNTER — Ambulatory Visit: Admitting: Physical Therapy

## 2024-04-20 ENCOUNTER — Ambulatory Visit: Admitting: Physical Therapy

## 2024-04-25 ENCOUNTER — Ambulatory Visit: Admitting: Physical Therapy

## 2024-04-26 LAB — BASIC METABOLIC PANEL WITH GFR
BUN/Creatinine Ratio: 13 (ref 10–24)
BUN: 17 mg/dL (ref 8–27)
CO2: 20 mmol/L (ref 20–29)
Calcium: 9.6 mg/dL (ref 8.6–10.2)
Chloride: 104 mmol/L (ref 96–106)
Creatinine, Ser: 1.35 mg/dL — ABNORMAL HIGH (ref 0.76–1.27)
Glucose: 120 mg/dL — ABNORMAL HIGH (ref 70–99)
Potassium: 4 mmol/L (ref 3.5–5.2)
Sodium: 142 mmol/L (ref 134–144)
eGFR: 54 mL/min/1.73 — ABNORMAL LOW (ref >59)

## 2024-04-27 ENCOUNTER — Ambulatory Visit: Admitting: Physical Therapy

## 2024-05-02 ENCOUNTER — Ambulatory Visit: Admitting: Physical Therapy

## 2024-05-04 ENCOUNTER — Ambulatory Visit: Admitting: Physical Therapy

## 2024-05-06 ENCOUNTER — Ambulatory Visit (INDEPENDENT_AMBULATORY_CARE_PROVIDER_SITE_OTHER): Admitting: Physician Assistant

## 2024-05-06 ENCOUNTER — Encounter: Payer: Self-pay | Admitting: Physician Assistant

## 2024-05-06 VITALS — BP 166/90 | HR 72 | Temp 98.3°F | Resp 16 | Ht 69.0 in | Wt 199.6 lb

## 2024-05-06 DIAGNOSIS — E1165 Type 2 diabetes mellitus with hyperglycemia: Secondary | ICD-10-CM | POA: Diagnosis not present

## 2024-05-06 DIAGNOSIS — I10 Essential (primary) hypertension: Secondary | ICD-10-CM | POA: Diagnosis not present

## 2024-05-06 DIAGNOSIS — N1831 Chronic kidney disease, stage 3a: Secondary | ICD-10-CM | POA: Diagnosis not present

## 2024-05-06 NOTE — Progress Notes (Signed)
 [facility] [address] [place], [place] [address]  Internal MEDICINE  Office Visit Note  Patient Name: Adam Castillo  [id number]  [id number]  Date of Service: 05/06/2024  Chief Complaint  Patient presents with   Follow-up    Review Labs   Diabetes   Hypertension    HPI Pt is here for routine follow up -BG at home low 100s in AM and in evening low 100s-150 at times -BP 130-160/80-90 -stopped lasix  and potassium and renal function improved, he reports no change in swelling--though minor swelling in ankles present and will monitor on norvasc . No SOB since stopping and no significant weight changes -has gotten his shingles vaccines, not sure about PNA vaccines--thinks second one was last month -Finished PT, still followed by neurology for neuropathy -bp not improved on recheck in office, somewhat better at home. May double amlodipine  and monitor  Current Medication: Outpatient Encounter Medications as of 05/06/2024  Medication Sig   Alpha-Lipoic Acid 100 MG CAPS Take 1 capsule (100 mg total) by mouth daily.   amLODipine  (NORVASC ) 2.5 MG tablet Take 1 tablet (2.5 mg total) by mouth daily.   aspirin EC 81 MG tablet Take 81 mg by mouth daily.   b complex vitamins capsule Take 1 capsule by mouth daily.   cholecalciferol (VITAMIN D3) 25 MCG (1000 UNIT) tablet Take 1,000 Units by mouth daily.   furosemide  (LASIX ) 20 MG tablet Take 1 tablet (20 mg total) by mouth daily.   gabapentin  (NEURONTIN ) 100 MG capsule Take 100 mg by mouth daily.   levothyroxine  (SYNTHROID ) 50 MCG tablet Take 1 tablet (50 mcg total) by mouth daily.   lisinopril  (ZESTRIL ) 40 MG tablet Take 1 tablet (40 mg total) by mouth daily.   metFORMIN  (GLUCOPHAGE ) 500 MG tablet Take 1 tablet (500 mg total) by mouth 2 (two) times daily with a meal.   Multiple Vitamins-Minerals (CENTRUM SILVER 50+MEN PO) Take 1 tablet by mouth daily.   potassium chloride (MICRO-K) 10 MEQ CR capsule Take 10 mEq by mouth daily.    simvastatin  (ZOCOR ) 20 MG tablet Take 1 tablet (20 mg total) by mouth daily.   No facility-administered encounter medications on file as of 05/06/2024.    Surgical History: Past Surgical History:  Procedure Laterality Date   COLONOSCOPY WITH PROPOFOL      COLONOSCOPY WITH PROPOFOL  N/A 10/15/2021   Procedure: COLONOSCOPY WITH PROPOFOL ;  Surgeon: Therisa Bi, MD;  Location: [facility] ENDOSCOPY;  Service: Gastroenterology;  Laterality: N/A;   HERNIA REPAIR     INSERTION OF MESH  08/27/2021   Procedure: INSERTION OF MESH;  Surgeon: Jordis Laneta FALCON, MD;  Location: [facility] ORS;  Service: General;;    Medical History: Past Medical History:  Diagnosis Date   Diabetes mellitus without complication (HCC)    Hypertension    Thyroid  disease     Family History: Family History  Problem Relation Age of Onset   COPD Mother    Heart disease Father     Social History   Socioeconomic History   Marital status: Single    Spouse name: Not on file   Number of children: Not on file   Years of education: Not on file   Highest education level: Not on file  Occupational History   Not on file  Tobacco Use   Smoking status: Never   Smokeless tobacco: Never  Vaping Use   Vaping status: Never Used  Substance and Sexual Activity   Alcohol use: Never   Drug use: Never  Sexual activity: Not on file  Other Topics Concern   Not on file  Social History Narrative   Not on file   Social Drivers of Health   Financial Resource Strain: Low Risk  (10/19/2023)   Received from [facility]   Overall Financial Resource Strain (CARDIA)    Difficulty of Paying Living Expenses: Not very hard  Food Insecurity: No Food Insecurity (10/19/2023)   Received from [facility]   Hunger Vital Sign    Within the past 12 months, you worried that your food would run out before you got the money to buy more.: Never true    Within the past 12 months, the food you bought just didn't last and you  didn't have money to get more.: Never true  Transportation Needs: No Transportation Needs (10/19/2023)   Received from [facility] - Transportation    In the past 12 months, has lack of transportation kept you from medical appointments or from getting medications?: No    Lack of Transportation (Non-Medical): No  Physical Activity: Inactive (08/11/2022)   Exercise Vital Sign    Days of Exercise per Week: 0 days    Minutes of Exercise per Session: 0 min  Stress: No Stress Concern Present (08/11/2022)   [employer] of Occupational Health - Occupational Stress Questionnaire    Feeling of Stress : Only a little  Social Connections: Socially Isolated (08/11/2022)   Social Connection and Isolation Panel    Frequency of Communication with Friends and Family: Twice a week    Frequency of Social Gatherings with Friends and Family: Once a week    Attends Religious Services: Never    Database administrator or Organizations: No    Attends Banker Meetings: Never    Marital Status: Divorced  Catering manager Violence: Not At Risk (08/11/2022)   Humiliation, Afraid, Rape, and Kick questionnaire    Fear of Current or Ex-Partner: No    Emotionally Abused: No    Physically Abused: No    Sexually Abused: No      Review of Systems  Constitutional:  Negative for chills, fatigue and unexpected weight change.  HENT:  Positive for postnasal drip. Negative for congestion, rhinorrhea, sneezing and sore throat.   Eyes:  Negative for redness.  Respiratory:  Negative for cough, chest tightness and wheezing.        SOBOE only  Cardiovascular:  Negative for chest pain and palpitations.  Gastrointestinal:  Negative for abdominal pain, constipation, diarrhea, nausea and vomiting.  Genitourinary:  Negative for dysuria and frequency.  Musculoskeletal:  Positive for gait problem. Negative for arthralgias, back pain, joint swelling and neck pain.  Skin:  Negative for  rash.  Neurological:  Positive for numbness. Negative for tremors.       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 Does not bruise/bleed easily.  Psychiatric/Behavioral:  Negative for behavioral problems (Depression), sleep disturbance and suicidal ideas. The patient is not nervous/anxious.     Vital Signs: BP (!) 166/90   Pulse 72   Temp 98.3 F (36.8 C)   Resp 16   Ht 5' 9 (1.753 m)   Wt 199 lb 9.6 oz (90.5 kg)   SpO2 98%   BMI 29.48 kg/m    Physical Exam Vitals and nursing note reviewed.  Constitutional:      General: He is not in acute distress.    Appearance: He is well-developed.  He is not diaphoretic.  HENT:     Head: Normocephalic and atraumatic.  Eyes:     Extraocular Movements: Extraocular movements intact.  Neck:     Thyroid : No thyromegaly.     Vascular: No JVD.     Trachea: No tracheal deviation.  Cardiovascular:     Rate and Rhythm: Normal rate and regular rhythm.     Heart sounds: Normal heart sounds. No murmur heard.    No friction rub. No gallop.  Pulmonary:     Effort: Pulmonary effort is normal. No respiratory distress.     Breath sounds: No wheezing or rales.  Chest:     Chest wall: No tenderness.  Musculoskeletal:     Comments: Min ankle swelling bilaterally  Skin:    General: Skin is warm and dry.  Neurological:     Mental Status: He is alert and oriented to person, place, and time.  Psychiatric:        Behavior: Behavior normal.        Thought Content: Thought content normal.        Judgment: Judgment normal.        Assessment/Plan: 1. Essential hypertension Will try double amlodipine  and monitor. Will need to monitor for ankle swelling as well with this increase as this could contribute, especially without diuretic now. May consider alternative if so  2. Type 2 diabetes mellitus with hyperglycemia, without long-term current use of insulin (HCC) (Primary) Improving, continue current medication  3.  Stage 3a chronic kidney disease (HCC) Improving since stopping lasix  and will monitor    General Counseling: aikeem lilley understanding of the findings of todays visit and agrees with plan of treatment. I have discussed any further diagnostic evaluation that may be needed or ordered today. We also reviewed his medications today. he has been encouraged to call the office with any questions or concerns that should arise related to todays visit.    No orders of the defined types were placed in this encounter.   No orders of the defined types were placed in this encounter.   This patient was seen by Tinnie Pro, PA-C in collaboration with Dr. Sigrid Bathe as a part of collaborative care agreement.   Total time spent:30 Minutes Time spent includes review of chart, medications, test results, and follow up plan with the patient.      Dr Fozia M Khan Internal medicine

## 2024-05-09 ENCOUNTER — Ambulatory Visit: Admitting: Physical Therapy

## 2024-05-11 ENCOUNTER — Ambulatory Visit: Admitting: Physical Therapy

## 2024-05-18 ENCOUNTER — Ambulatory Visit: Admitting: Physical Therapy

## 2024-05-23 ENCOUNTER — Ambulatory Visit: Admitting: Physical Therapy

## 2024-05-25 ENCOUNTER — Ambulatory Visit: Admitting: Physical Therapy

## 2024-05-30 ENCOUNTER — Ambulatory Visit: Admitting: Physical Therapy

## 2024-06-01 ENCOUNTER — Ambulatory Visit: Admitting: Physical Therapy

## 2024-06-02 ENCOUNTER — Ambulatory Visit (INDEPENDENT_AMBULATORY_CARE_PROVIDER_SITE_OTHER): Admitting: Physician Assistant

## 2024-06-02 ENCOUNTER — Encounter: Payer: Self-pay | Admitting: Physician Assistant

## 2024-06-02 VITALS — BP 162/88 | HR 87 | Temp 98.0°F | Resp 16 | Ht 69.0 in | Wt 196.0 lb

## 2024-06-02 DIAGNOSIS — E1165 Type 2 diabetes mellitus with hyperglycemia: Secondary | ICD-10-CM

## 2024-06-02 DIAGNOSIS — I10 Essential (primary) hypertension: Secondary | ICD-10-CM

## 2024-06-02 MED ORDER — AMLODIPINE BESYLATE 10 MG PO TABS: 10.0000 mg | ORAL_TABLET | Freq: Every day | ORAL | 1 refills | Status: AC

## 2024-06-02 MED ORDER — AMLODIPINE BESYLATE 2.5 MG PO TABS: 2.5000 mg | ORAL_TABLET | Freq: Every day | ORAL | 3 refills | Status: DC

## 2024-06-02 NOTE — Progress Notes (Signed)
 [facility] [address] [place], [place] [address]  Internal MEDICINE  Office Visit Note  Patient Name: Adam Castillo  [id number]  [id number]  Date of Service: 06/02/2024  Chief Complaint  Patient presents with   Follow-up   Hypertension   Diabetes    HPI Pt is here for routine follow up -Home BP 140-150/70-80 -taking 5mg  amlodipine , no ankle swelling with this and still no need for diuretic -goes to ortho on the 23rd for his knees -BG 125-130, occasional 117  Current Medication: Outpatient Encounter Medications as of 06/02/2024  Medication Sig   Alpha-Lipoic Acid 100 MG CAPS Take 1 capsule (100 mg total) by mouth daily.   amLODipine  (NORVASC ) 10 MG tablet Take 1 tablet (10 mg total) by mouth daily.   aspirin EC 81 MG tablet Take 81 mg by mouth daily.   b complex vitamins capsule Take 1 capsule by mouth daily.   cholecalciferol (VITAMIN D3) 25 MCG (1000 UNIT) tablet Take 1,000 Units by mouth daily.   gabapentin  (NEURONTIN ) 100 MG capsule Take 100 mg by mouth daily.   levothyroxine  (SYNTHROID ) 50 MCG tablet Take 1 tablet (50 mcg total) by mouth daily.   lisinopril  (ZESTRIL ) 40 MG tablet Take 1 tablet (40 mg total) by mouth daily.   metFORMIN  (GLUCOPHAGE ) 500 MG tablet Take 1 tablet (500 mg total) by mouth 2 (two) times daily with a meal.   Multiple Vitamins-Minerals (CENTRUM SILVER 50+MEN PO) Take 1 tablet by mouth daily.   potassium chloride (MICRO-K) 10 MEQ CR capsule Take 10 mEq by mouth daily.   simvastatin  (ZOCOR ) 20 MG tablet Take 1 tablet (20 mg total) by mouth daily.   [DISCONTINUED] amLODipine  (NORVASC ) 2.5 MG tablet Take 1 tablet (2.5 mg total) by mouth daily.   [DISCONTINUED] furosemide  (LASIX ) 20 MG tablet Take 1 tablet (20 mg total) by mouth daily.   [DISCONTINUED] amLODipine  (NORVASC ) 2.5 MG tablet Take 1 tablet (2.5 mg total) by mouth daily.   No facility-administered encounter medications on file as of 06/02/2024.    Surgical History: Past  Surgical History:  Procedure Laterality Date   COLONOSCOPY WITH PROPOFOL      COLONOSCOPY WITH PROPOFOL  N/A 10/15/2021   Procedure: COLONOSCOPY WITH PROPOFOL ;  Surgeon: Therisa Bi, MD;  Location: [facility] ENDOSCOPY;  Service: Gastroenterology;  Laterality: N/A;   HERNIA REPAIR     INSERTION OF MESH  08/27/2021   Procedure: INSERTION OF MESH;  Surgeon: Jordis Laneta FALCON, MD;  Location: [facility] ORS;  Service: General;;    Medical History: Past Medical History:  Diagnosis Date   Diabetes mellitus without complication (HCC)    Hypertension    Thyroid  disease     Family History: Family History  Problem Relation Age of Onset   COPD Mother    Heart disease Father     Social History   Socioeconomic History   Marital status: Single    Spouse name: Not on file   Number of children: Not on file   Years of education: Not on file   Highest education level: Not on file  Occupational History   Not on file  Tobacco Use   Smoking status: Never   Smokeless tobacco: Never  Vaping Use   Vaping status: Never Used  Substance and Sexual Activity   Alcohol use: Never   Drug use: Never   Sexual activity: Not on file  Other Topics Concern   Not on file  Social History Narrative   Not on file   Social Drivers of  Health   Financial Resource Strain: Low Risk  (10/19/2023)   Received from [facility]   Overall Financial Resource Strain (CARDIA)    Difficulty of Paying Living Expenses: Not very hard  Food Insecurity: No Food Insecurity (10/19/2023)   Received from [facility]   Hunger Vital Sign    Within the past 12 months, you worried that your food would run out before you got the money to buy more.: Never true    Within the past 12 months, the food you bought just didn't last and you didn't have money to get more.: Never true  Transportation Needs: No Transportation Needs (10/19/2023)   Received from [facility] - Transportation    In  the past 12 months, has lack of transportation kept you from medical appointments or from getting medications?: No    Lack of Transportation (Non-Medical): No  Physical Activity: Inactive (08/11/2022)   Exercise Vital Sign    Days of Exercise per Week: 0 days    Minutes of Exercise per Session: 0 min  Stress: No Stress Concern Present (08/11/2022)   [employer] of Occupational Health - Occupational Stress Questionnaire    Feeling of Stress : Only a little  Social Connections: Socially Isolated (08/11/2022)   Social Connection and Isolation Panel    Frequency of Communication with Friends and Family: Twice a week    Frequency of Social Gatherings with Friends and Family: Once a week    Attends Religious Services: Never    Database administrator or Organizations: No    Attends Banker Meetings: Never    Marital Status: Divorced  Catering manager Violence: Not At Risk (08/11/2022)   Humiliation, Afraid, Rape, and Kick questionnaire    Fear of Current or Ex-Partner: No    Emotionally Abused: No    Physically Abused: No    Sexually Abused: No      Review of Systems  Constitutional:  Negative for chills, fatigue and unexpected weight change.  HENT:  Positive for postnasal drip. Negative for congestion, rhinorrhea, sneezing and sore throat.   Eyes:  Negative for redness.  Respiratory:  Negative for cough, chest tightness and wheezing.        SOBOE only  Cardiovascular:  Negative for chest pain, palpitations and leg swelling.  Gastrointestinal:  Negative for abdominal pain, constipation, diarrhea, nausea and vomiting.  Genitourinary:  Negative for dysuria and frequency.  Musculoskeletal:  Positive for gait problem. Negative for arthralgias, back pain, joint swelling and neck pain.  Skin:  Negative for rash.  Neurological:  Positive for numbness. Negative for tremors.       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 Does not  bruise/bleed easily.  Psychiatric/Behavioral:  Negative for behavioral problems (Depression), sleep disturbance and suicidal ideas. The patient is not nervous/anxious.     Vital Signs: BP (!) 162/88   Pulse 87   Temp 98 F (36.7 C)   Resp 16   Ht 5' 9 (1.753 m)   Wt 196 lb (88.9 kg)   SpO2 98%   BMI 28.94 kg/m    Physical Exam Vitals and nursing note reviewed.  Constitutional:      General: He is not in acute distress.    Appearance: He is well-developed. He is not diaphoretic.  HENT:     Head: Normocephalic and atraumatic.  Eyes:     Extraocular Movements: Extraocular movements intact.  Neck:  Thyroid : No thyromegaly.     Vascular: No JVD.     Trachea: No tracheal deviation.  Cardiovascular:     Rate and Rhythm: Normal rate and regular rhythm.     Heart sounds: Normal heart sounds. No murmur heard.    No friction rub. No gallop.  Pulmonary:     Effort: Pulmonary effort is normal. No respiratory distress.     Breath sounds: No wheezing or rales.  Chest:     Chest wall: No tenderness.  Musculoskeletal:     Right lower leg: No edema.     Left lower leg: No edema.  Skin:    General: Skin is warm and dry.  Neurological:     Mental Status: He is alert and oriented to person, place, and time.  Psychiatric:        Behavior: Behavior normal.        Thought Content: Thought content normal.        Judgment: Judgment normal.        Assessment/Plan: 1. Essential hypertension Will increase to 10mg  amlodipine  and continue lisinopril . Monitor at home.  - amLODipine  (NORVASC ) 10 MG tablet; Take 1 tablet (10 mg total) by mouth daily.  Dispense: 90 tablet; Refill: 1  2. Type 2 diabetes mellitus with hyperglycemia, without long-term current use of insulin (HCC) (Primary) Stable, continue current medication and monitoring    General Counseling: teresa lemmerman understanding of the findings of todays visit and agrees with plan of treatment. I have discussed any  further diagnostic evaluation that may be needed or ordered today. We also reviewed his medications today. he has been encouraged to call the office with any questions or concerns that should arise related to todays visit.    No orders of the defined types were placed in this encounter.   Meds ordered this encounter  Medications   DISCONTD: amLODipine  (NORVASC ) 2.5 MG tablet    Sig: Take 1 tablet (2.5 mg total) by mouth daily.    Dispense:  90 tablet    Refill:  3   amLODipine  (NORVASC ) 10 MG tablet    Sig: Take 1 tablet (10 mg total) by mouth daily.    Dispense:  90 tablet    Refill:  1    Please fill increased dose. D/c 2.5mg     This patient was seen by Tinnie Pro, PA-C in collaboration with Dr. Sigrid Bathe as a part of collaborative care agreement.   Total time spent:30 Minutes Time spent includes review of chart, medications, test results, and follow up plan with the patient.      Dr Fozia M Khan Internal medicine

## 2024-06-06 ENCOUNTER — Ambulatory Visit: Admitting: Physical Therapy

## 2024-06-08 ENCOUNTER — Ambulatory Visit: Admitting: Physical Therapy

## 2024-06-13 ENCOUNTER — Ambulatory Visit: Admitting: Physical Therapy

## 2024-06-15 ENCOUNTER — Ambulatory Visit: Admitting: Physical Therapy

## 2024-06-20 ENCOUNTER — Ambulatory Visit: Admitting: Physical Therapy

## 2024-06-22 ENCOUNTER — Ambulatory Visit: Admitting: Physical Therapy

## 2024-06-27 ENCOUNTER — Ambulatory Visit: Admitting: Physical Therapy

## 2024-06-29 ENCOUNTER — Ambulatory Visit: Admitting: Physical Therapy

## 2024-07-02 ENCOUNTER — Other Ambulatory Visit: Payer: Self-pay | Admitting: Physician Assistant

## 2024-07-04 ENCOUNTER — Ambulatory Visit: Admitting: Physical Therapy

## 2024-07-04 ENCOUNTER — Encounter: Payer: Self-pay | Admitting: Physician Assistant

## 2024-07-04 ENCOUNTER — Ambulatory Visit (INDEPENDENT_AMBULATORY_CARE_PROVIDER_SITE_OTHER): Admitting: Physician Assistant

## 2024-07-04 VITALS — BP 138/71 | HR 79 | Temp 97.8°F | Resp 16 | Ht 69.0 in | Wt 197.0 lb

## 2024-07-04 DIAGNOSIS — E1142 Type 2 diabetes mellitus with diabetic polyneuropathy: Secondary | ICD-10-CM | POA: Diagnosis not present

## 2024-07-04 DIAGNOSIS — N1831 Chronic kidney disease, stage 3a: Secondary | ICD-10-CM | POA: Diagnosis not present

## 2024-07-04 DIAGNOSIS — M1711 Unilateral primary osteoarthritis, right knee: Secondary | ICD-10-CM

## 2024-07-04 DIAGNOSIS — I10 Essential (primary) hypertension: Secondary | ICD-10-CM | POA: Diagnosis not present

## 2024-07-04 LAB — POCT GLYCOSYLATED HEMOGLOBIN (HGB A1C): Hemoglobin A1C: 6.4 % — AB (ref 4.0–5.6)

## 2024-07-04 NOTE — Progress Notes (Signed)
 [facility] [address] [place], [place] [address]  Internal MEDICINE  Office Visit Note  Patient Name: Adam Castillo  [id number]  [id number]  Date of Service: 07/04/2024  Chief Complaint  Patient presents with   Follow-up    hypertension    HPI Pt is here for routine follow up -BG in AM 110-130 -Bp at home in AM before meds around 8am 140s/80-90 and around 4pm 130-160/70-80 -takes his meds around 8am after checking reading, encouraged to check some BP after he takes his meds -seeing neurology for neuropathy and taking 100mg  gabapentin  in AM and 300mg  in PM, does have cane and walker at home that were his dad's that he could use if needed but doesn't use anything. Thinks if knee is fixed then this will help -will be having gel injection in knee on the 24th  Current Medication: Outpatient Encounter Medications as of 07/04/2024  Medication Sig   Alpha-Lipoic Acid 100 MG CAPS Take 1 capsule (100 mg total) by mouth daily.   amLODipine  (NORVASC ) 10 MG tablet Take 1 tablet (10 mg total) by mouth daily.   aspirin EC 81 MG tablet Take 81 mg by mouth daily.   b complex vitamins capsule Take 1 capsule by mouth daily.   cholecalciferol (VITAMIN D3) 25 MCG (1000 UNIT) tablet Take 1,000 Units by mouth daily.   gabapentin  (NEURONTIN ) 100 MG capsule Take 100 mg by mouth daily.   gabapentin  (NEURONTIN ) 300 MG capsule Take 300 mg by mouth at bedtime.   lisinopril  (ZESTRIL ) 40 MG tablet Take 1 tablet (40 mg total) by mouth daily.   metFORMIN  (GLUCOPHAGE ) 500 MG tablet Take 1 tablet (500 mg total) by mouth 2 (two) times daily with a meal.   Multiple Vitamins-Minerals (CENTRUM SILVER 50+MEN PO) Take 1 tablet by mouth daily.   simvastatin  (ZOCOR ) 20 MG tablet Take 1 tablet (20 mg total) by mouth daily.   [DISCONTINUED] levothyroxine  (SYNTHROID ) 50 MCG tablet Take 1 tablet (50 mcg total) by mouth daily.   [DISCONTINUED] potassium chloride (MICRO-K) 10 MEQ CR capsule Take 10 mEq by mouth  daily.   No facility-administered encounter medications on file as of 07/04/2024.    Surgical History: Past Surgical History:  Procedure Laterality Date   COLONOSCOPY WITH PROPOFOL      COLONOSCOPY WITH PROPOFOL  N/A 10/15/2021   Procedure: COLONOSCOPY WITH PROPOFOL ;  Surgeon: Therisa Bi, MD;  Location: [facility] ENDOSCOPY;  Service: Gastroenterology;  Laterality: N/A;   HERNIA REPAIR     INSERTION OF MESH  08/27/2021   Procedure: INSERTION OF MESH;  Surgeon: Jordis Laneta FALCON, MD;  Location: [facility] ORS;  Service: General;;    Medical History: Past Medical History:  Diagnosis Date   Diabetes mellitus without complication (HCC)    Hypertension    Thyroid  disease     Family History: Family History  Problem Relation Age of Onset   COPD Mother    Heart disease Father     Social History   Socioeconomic History   Marital status: Single    Spouse name: Not on file   Number of children: Not on file   Years of education: Not on file   Highest education level: Not on file  Occupational History   Not on file  Tobacco Use   Smoking status: Never   Smokeless tobacco: Never  Vaping Use   Vaping status: Never Used  Substance and Sexual Activity   Alcohol use: Never   Drug use: Never   Sexual activity: Not on file  Other Topics Concern   Not on file  Social History Narrative   Not on file   Social Drivers of Health   Financial Resource Strain: Low Risk  (06/07/2024)   Received from [facility]   Overall Financial Resource Strain (CARDIA)    Difficulty of Paying Living Expenses: Not very hard  Food Insecurity: No Food Insecurity (06/07/2024)   Received from [facility]   Hunger Vital Sign    Within the past 12 months, you worried that your food would run out before you got the money to buy more.: Never true    Within the past 12 months, the food you bought just didn't last and you didn't have money to get more.: Never true  Transportation Needs:  No Transportation Needs (06/07/2024)   Received from [facility] Transportation    In the past 12 months, has lack of transportation kept you from medical appointments or from getting medications?: No    Lack of Transportation (Non-Medical): No  Physical Activity: Inactive (08/11/2022)   Exercise Vital Sign    Days of Exercise per Week: 0 days    Minutes of Exercise per Session: 0 min  Stress: No Stress Concern Present (08/11/2022)   [employer] of Occupational Health - Occupational Stress Questionnaire    Feeling of Stress : Only a little  Social Connections: Socially Isolated (08/11/2022)   Social Connection and Isolation Panel    Frequency of Communication with Friends and Family: Twice a week    Frequency of Social Gatherings with Friends and Family: Once a week    Attends Religious Services: Never    Database administrator or Organizations: No    Attends Banker Meetings: Never    Marital Status: Divorced  Catering manager Violence: Not At Risk (08/11/2022)   Humiliation, Afraid, Rape, and Kick questionnaire    Fear of Current or Ex-Partner: No    Emotionally Abused: No    Physically Abused: No    Sexually Abused: No      Review of Systems  Constitutional:  Negative for chills, fatigue and unexpected weight change.  HENT:  Positive for postnasal drip. Negative for congestion, rhinorrhea, sneezing and sore throat.   Eyes:  Negative for redness.  Respiratory:  Negative for cough, chest tightness and wheezing.   Cardiovascular:  Negative for chest pain, palpitations and leg swelling.  Gastrointestinal:  Negative for abdominal pain, constipation, diarrhea, nausea and vomiting.  Genitourinary:  Negative for dysuria and frequency.  Musculoskeletal:  Positive for gait problem. Negative for arthralgias, back pain, joint swelling and neck pain.  Skin:  Negative for rash.  Neurological:  Positive for numbness. Negative for tremors.        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 Does not bruise/bleed easily.  Psychiatric/Behavioral:  Negative for behavioral problems (Depression), sleep disturbance and suicidal ideas. The patient is not nervous/anxious.     Vital Signs: BP 138/71   Pulse 79   Temp 97.8 F (36.6 C)   Resp 16   Ht 5' 9 (1.753 m)   Wt 197 lb (89.4 kg)   SpO2 94%   BMI 29.09 kg/m    Physical Exam Vitals and nursing note reviewed.  Constitutional:      General: He is not in acute distress.    Appearance: He is well-developed. He is not diaphoretic.  HENT:     Head: Normocephalic and atraumatic.  Eyes:     Extraocular Movements: Extraocular movements intact.  Neck:     Thyroid : No thyromegaly.     Vascular: No JVD.     Trachea: No tracheal deviation.  Cardiovascular:     Rate and Rhythm: Normal rate and regular rhythm.     Heart sounds: Normal heart sounds. No murmur heard.    No friction rub. No gallop.  Pulmonary:     Effort: Pulmonary effort is normal. No respiratory distress.     Breath sounds: No wheezing or rales.  Chest:     Chest wall: No tenderness.  Musculoskeletal:     Right lower leg: No edema.     Left lower leg: No edema.  Skin:    General: Skin is warm and dry.  Neurological:     Mental Status: He is alert and oriented to person, place, and time.  Psychiatric:        Behavior: Behavior normal.        Thought Content: Thought content normal.        Judgment: Judgment normal.        Assessment/Plan: 1. Type 2 diabetes mellitus with diabetic polyneuropathy, without long-term current use of insulin (HCC) (Primary) - POCT HgB A1C is 6.4 which is up slightly from 6.1 last check, but is overall well controlled. Continue current medications and following up with neurology for neuropathy  2. Essential hypertension Improved, continue current medications and monitor   General Counseling: markel kurtenbach understanding of the findings of  todays visit and agrees with plan of treatment. I have discussed any further diagnostic evaluation that may be needed or ordered today. We also reviewed his medications today. he has been encouraged to call the office with any questions or concerns that should arise related to todays visit.    Orders Placed This Encounter  Procedures   POCT HgB A1C    No orders of the defined types were placed in this encounter.   This patient was seen by Tinnie Pro, PA-C in collaboration with Dr. Sigrid Bathe as a part of collaborative care agreement.   Total time spent:30 Minutes Time spent includes review of chart, medications, test results, and follow up plan with the patient.      Dr Fozia M Khan Internal medicine

## 2024-07-06 ENCOUNTER — Ambulatory Visit: Admitting: Physical Therapy

## 2024-07-13 ENCOUNTER — Other Ambulatory Visit: Payer: Self-pay

## 2024-07-13 MED ORDER — LISINOPRIL 40 MG PO TABS
40.0000 mg | ORAL_TABLET | Freq: Every day | ORAL | 3 refills | Status: AC
Start: 2024-07-13 — End: ?

## 2024-08-19 ENCOUNTER — Encounter: Payer: Self-pay | Admitting: Surgery

## 2024-10-06 ENCOUNTER — Ambulatory Visit (INDEPENDENT_AMBULATORY_CARE_PROVIDER_SITE_OTHER): Admitting: Physician Assistant

## 2024-10-06 ENCOUNTER — Encounter: Payer: Self-pay | Admitting: Physician Assistant

## 2024-10-06 VITALS — BP 163/79 | HR 85 | Temp 98.0°F | Resp 16 | Ht 69.0 in | Wt 195.0 lb

## 2024-10-06 DIAGNOSIS — M1711 Unilateral primary osteoarthritis, right knee: Secondary | ICD-10-CM

## 2024-10-06 DIAGNOSIS — N1831 Chronic kidney disease, stage 3a: Secondary | ICD-10-CM

## 2024-10-06 DIAGNOSIS — E1142 Type 2 diabetes mellitus with diabetic polyneuropathy: Secondary | ICD-10-CM | POA: Diagnosis not present

## 2024-10-06 DIAGNOSIS — I10 Essential (primary) hypertension: Secondary | ICD-10-CM

## 2024-10-06 LAB — POCT GLYCOSYLATED HEMOGLOBIN (HGB A1C): Hemoglobin A1C: 6.2 % — AB (ref 4.0–5.6)

## 2024-10-06 MED ORDER — HYDRALAZINE HCL 10 MG PO TABS: 10.0000 mg | ORAL_TABLET | Freq: Every day | ORAL | 3 refills | Status: AC

## 2024-10-06 NOTE — Progress Notes (Signed)
 [facility] [address] [place], [place] [address]  Internal MEDICINE  Office Visit Note  Patient Name: Adam Castillo  [id number]  [id number]  Date of Service: 10/06/2024  Chief Complaint  Patient presents with   Follow-up   Hypertension   Diabetes    HPI Pt is here for routine follow up -Followed by neurology for neuropathy and ortho for OA of knees.  -He had gel injection in October, but didn't help much. He is thinking he may need a knee replacement and will discuss with ortho -Bp at home 135-165/80s, on avg has been on higher side lately. Swelling not bad, a little in ankles which he attributes to decreased movement with arthritis and neuropathy. When able to walk more he does better -BG 115-140 in AM, 133-164 in PM  Current Medication: Outpatient Encounter Medications as of 10/06/2024  Medication Sig   amLODipine  (NORVASC ) 10 MG tablet Take 1 tablet (10 mg total) by mouth daily.   aspirin EC 81 MG tablet Take 81 mg by mouth daily.   b complex vitamins capsule Take 1 capsule by mouth daily.   cholecalciferol (VITAMIN D3) 25 MCG (1000 UNIT) tablet Take 1,000 Units by mouth daily.   gabapentin  (NEURONTIN ) 100 MG capsule Take 100 mg by mouth daily.   gabapentin  (NEURONTIN ) 300 MG capsule Take 300 mg by mouth at bedtime.   hydrALAZINE  (APRESOLINE ) 10 MG tablet Take 1 tablet (10 mg total) by mouth daily. May take twice daily if BP elevated   levothyroxine  (SYNTHROID ) 50 MCG tablet TAKE 1 TABLET BY MOUTH EVERY DAY   lisinopril  (ZESTRIL ) 40 MG tablet Take 1 tablet (40 mg total) by mouth daily.   metFORMIN  (GLUCOPHAGE ) 500 MG tablet Take 1 tablet (500 mg total) by mouth 2 (two) times daily with a meal.   Multiple Vitamins-Minerals (CENTRUM SILVER 50+MEN PO) Take 1 tablet by mouth daily.   simvastatin  (ZOCOR ) 20 MG tablet Take 1 tablet (20 mg total) by mouth daily.   [DISCONTINUED] Alpha-Lipoic Acid 100 MG CAPS Take 1 capsule (100 mg total) by mouth daily.   No  facility-administered encounter medications on file as of 10/06/2024.    Surgical History: Past Surgical History:  Procedure Laterality Date   COLONOSCOPY WITH PROPOFOL      COLONOSCOPY WITH PROPOFOL  N/A 10/15/2021   Procedure: COLONOSCOPY WITH PROPOFOL ;  Surgeon: Therisa Bi, MD;  Location: [facility] ENDOSCOPY;  Service: Gastroenterology;  Laterality: N/A;   HERNIA REPAIR     HERNIORRHAPHY, INGUINAL, ROBOT-ASSISTED, LAPAROSCOPIC Bilateral 08/27/2021   Procedure: XI ROBOTIC ASSISTED INGUINAL HERNIA, possible open, possible bilateral;  Surgeon: Jordis Laneta FALCON, MD;  Location: [facility] ORS;  Service: General;  Laterality: Bilateral;   INSERTION OF MESH  08/27/2021   Procedure: INSERTION OF MESH;  Surgeon: Jordis Laneta FALCON, MD;  Location: [facility] ORS;  Service: General;;    Medical History: Past Medical History:  Diagnosis Date   Diabetes mellitus without complication (HCC)    Hypertension    Thyroid  disease     Family History: Family History  Problem Relation Age of Onset   COPD Mother    Heart disease Father     Social History   Socioeconomic History   Marital status: Single    Spouse name: Not on file   Number of children: Not on file   Years of education: Not on file   Highest education level: Not on file  Occupational History   Not on file  Tobacco Use   Smoking status: Never   Smokeless  tobacco: Never  Vaping Use   Vaping status: Never Used  Substance and Sexual Activity   Alcohol use: Never   Drug use: Never   Sexual activity: Not on file  Other Topics Concern   Not on file  Social History Narrative   Not on file   Social Drivers of Health   Tobacco Use: Low Risk (10/06/2024)   Patient History    Smoking Tobacco Use: Never    Smokeless Tobacco Use: Never    Passive Exposure: Not on file  Financial Resource Strain: Low Risk  (06/07/2024)   Received from [facility]   Overall Financial Resource Strain (CARDIA)    Difficulty of Paying Living Expenses:  Not very hard  Food Insecurity: No Food Insecurity (06/07/2024)   Received from [facility]   Epic    Within the past 12 months, you worried that your food would run out before you got the money to buy more.: Never true    Within the past 12 months, the food you bought just didn't last and you didn't have money to get more.: Never true  Transportation Needs: No Transportation Needs (06/07/2024)   Received from [facility] - Transportation    In the past 12 months, has lack of transportation kept you from medical appointments or from getting medications?: No    Lack of Transportation (Non-Medical): No  Physical Activity: Inactive (08/11/2022)   Exercise Vital Sign    Days of Exercise per Week: 0 days    Minutes of Exercise per Session: 0 min  Stress: No Stress Concern Present (08/11/2022)   [employer] - Occupational Stress Questionnaire    Feeling of Stress : Only a little  Social Connections: Socially Isolated (08/11/2022)   Social Connection and Isolation Panel    Frequency of Communication with Friends and Family: Twice a week    Frequency of Social Gatherings with Friends and Family: Once a week    Attends Religious Services: Never    Database Administrator or Organizations: No    Attends Banker Meetings: Never    Marital Status: Divorced  Catering Manager Violence: Not At Risk (08/11/2022)   Humiliation, Afraid, Rape, and Kick questionnaire    Fear of Current or Ex-Partner: No    Emotionally Abused: No    Physically Abused: No    Sexually Abused: No  Depression (PHQ2-9): Low Risk (10/06/2024)   Depression (PHQ2-9)    PHQ-2 Score: 0  Alcohol Screen: Low Risk (08/11/2022)   Alcohol Screen    Last Alcohol Screening Score (AUDIT): 0  Housing: Low Risk  (06/07/2024)   Received from [facility]   Epic    In the last 12 months, was there a time when you were not able to pay  the mortgage or rent on time?: No    In the past 12 months, how many times have you moved where you were living?: 0    At any time in the past 12 months, were you homeless or living in a shelter (including now)?: No  Utilities: Not At Risk (06/07/2024)   Received from [facility]   Epic    In the past 12 months has the electric, gas, oil, or water company threatened to shut off services in your home?: No  Health Literacy: Not on file      Review of Systems  Constitutional:  Negative for chills, fatigue  and unexpected weight change.  HENT:  Positive for postnasal drip. Negative for congestion, rhinorrhea, sneezing and sore throat.   Eyes:  Negative for redness.  Respiratory:  Negative for cough, chest tightness, shortness of breath and wheezing.   Cardiovascular:  Negative for chest pain and palpitations.  Gastrointestinal:  Negative for abdominal pain, constipation, diarrhea, nausea and vomiting.  Genitourinary:  Negative for dysuria and frequency.  Musculoskeletal:  Positive for arthralgias and gait problem. Negative for back pain, joint swelling and neck pain.  Skin:  Negative for rash.  Neurological:  Positive for numbness. Negative for tremors.       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 Does not bruise/bleed easily.  Psychiatric/Behavioral:  Negative for behavioral problems (Depression), sleep disturbance and suicidal ideas. The patient is not nervous/anxious.     Vital Signs: BP (!) 163/79   Pulse 85   Temp 98 F (36.7 C)   Resp 16   Ht 5' 9 (1.753 m)   Wt 195 lb (88.5 kg)   SpO2 97%   BMI 28.80 kg/m    Physical Exam Vitals and nursing note reviewed.  Constitutional:      General: He is not in acute distress.    Appearance: He is well-developed. He is not diaphoretic.  HENT:     Head: Normocephalic and atraumatic.  Eyes:     Extraocular Movements: Extraocular movements intact.  Neck:     Thyroid : No  thyromegaly.     Vascular: No JVD.     Trachea: No tracheal deviation.  Cardiovascular:     Rate and Rhythm: Normal rate and regular rhythm.     Heart sounds: Normal heart sounds. No murmur heard.    No friction rub. No gallop.  Pulmonary:     Effort: Pulmonary effort is normal. No respiratory distress.     Breath sounds: No wheezing or rales.  Chest:     Chest wall: No tenderness.  Musculoskeletal:     Right lower leg: No edema.     Left lower leg: No edema.  Skin:    General: Skin is warm and dry.  Neurological:     Mental Status: He is alert and oriented to person, place, and time.  Psychiatric:        Behavior: Behavior normal.        Thought Content: Thought content normal.        Judgment: Judgment normal.        Assessment/Plan: 1. Type 2 diabetes mellitus with diabetic polyneuropathy, without long-term current use of insulin (HCC) (Primary) - POCT HgB A1C is 6.2 which is down from 6.4 last check and is well controlled. Followed by neurology for neuropathy  2. Essential hypertension Will add hydralazine  daily to help BP without negative impact on kidneys previously seen with diuretics. May take BID if BP still elevated and will recheck in 4 weeks - hydrALAZINE  (APRESOLINE ) 10 MG tablet; Take 1 tablet (10 mg total) by mouth daily. May take twice daily if BP elevated  Dispense: 90 tablet; Refill: 3  3. Primary osteoarthritis of right knee Followed by ortho  4. Stage 3a chronic kidney disease (HCC) Improved after stopping diuretics and will continue to monitor and optimize BP/BG   General Counseling: kalev temme understanding of the findings of todays visit and agrees with plan of treatment. I have discussed any further diagnostic evaluation that may be needed or ordered today. We also reviewed his medications today. he has been encouraged  to call the office with any questions or concerns that should arise related to todays visit.    Orders Placed This  Encounter  Procedures   POCT HgB A1C    Meds ordered this encounter  Medications   hydrALAZINE  (APRESOLINE ) 10 MG tablet    Sig: Take 1 tablet (10 mg total) by mouth daily. May take twice daily if BP elevated    Dispense:  90 tablet    Refill:  3    This patient was seen by Tinnie Pro, PA-C in collaboration with Dr. Sigrid Bathe as a part of collaborative care agreement.   Total time spent:30 Minutes Time spent includes review of chart, medications, test results, and follow up plan with the patient.      Dr Fozia M Khan Internal medicine

## 2024-11-03 ENCOUNTER — Ambulatory Visit: Admitting: Physician Assistant

## 2025-04-03 ENCOUNTER — Ambulatory Visit: Admitting: Physician Assistant
